# Patient Record
Sex: Female | Born: 2000 | Race: White | Hispanic: No | Marital: Single | Smoking: Former smoker
Health system: Southern US, Community
[De-identification: ages and names within clinical notes are randomized; demographics above are authoritative.]

## PROBLEM LIST (undated history)

## (undated) VITALS — BP 104/67 | HR 98 | Temp 97.7°F | Resp 16 | Ht 63.19 in | Wt 105.4 lb

## (undated) DIAGNOSIS — F1910 Other psychoactive substance abuse, uncomplicated: Secondary | ICD-10-CM

## (undated) DIAGNOSIS — A6000 Herpesviral infection of urogenital system, unspecified: Secondary | ICD-10-CM

## (undated) DIAGNOSIS — F32A Depression, unspecified: Secondary | ICD-10-CM

## (undated) DIAGNOSIS — M419 Scoliosis, unspecified: Secondary | ICD-10-CM

## (undated) DIAGNOSIS — J189 Pneumonia, unspecified organism: Secondary | ICD-10-CM

## (undated) DIAGNOSIS — F319 Bipolar disorder, unspecified: Secondary | ICD-10-CM

## (undated) DIAGNOSIS — F909 Attention-deficit hyperactivity disorder, unspecified type: Secondary | ICD-10-CM

## (undated) DIAGNOSIS — F329 Major depressive disorder, single episode, unspecified: Secondary | ICD-10-CM

## (undated) DIAGNOSIS — F1994 Other psychoactive substance use, unspecified with psychoactive substance-induced mood disorder: Secondary | ICD-10-CM

## (undated) DIAGNOSIS — L709 Acne, unspecified: Secondary | ICD-10-CM

## (undated) DIAGNOSIS — F419 Anxiety disorder, unspecified: Secondary | ICD-10-CM

## (undated) HISTORY — DX: Anxiety disorder, unspecified: F41.9

## (undated) HISTORY — DX: Herpesviral infection of urogenital system, unspecified: A60.00

---
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
## 2018-01-01 ENCOUNTER — Encounter (HOSPITAL_COMMUNITY): Payer: Self-pay | Admitting: Emergency Medicine

## 2018-01-01 ENCOUNTER — Other Ambulatory Visit: Payer: Self-pay

## 2018-01-01 ENCOUNTER — Emergency Department (HOSPITAL_COMMUNITY)
Admission: EM | Admit: 2018-01-01 | Discharge: 2018-01-01 | Disposition: A | Discharge status: Home / Self Care | Payer: PRIVATE HEALTH INSURANCE | Source: Home / Self Care | Attending: Emergency Medicine | Admitting: Emergency Medicine

## 2018-01-01 ENCOUNTER — Emergency Department (HOSPITAL_COMMUNITY): Payer: PRIVATE HEALTH INSURANCE

## 2018-01-01 ENCOUNTER — Emergency Department (HOSPITAL_COMMUNITY)
Admission: EM | Admit: 2018-01-01 | Discharge: 2018-01-01 | Disposition: A | Discharge status: Home / Self Care | Payer: PRIVATE HEALTH INSURANCE | Attending: Emergency Medicine | Admitting: Emergency Medicine

## 2018-01-01 DIAGNOSIS — F1910 Other psychoactive substance abuse, uncomplicated: Secondary | ICD-10-CM | POA: Insufficient documentation

## 2018-01-01 DIAGNOSIS — R1084 Generalized abdominal pain: Secondary | ICD-10-CM

## 2018-01-01 DIAGNOSIS — Z79899 Other long term (current) drug therapy: Secondary | ICD-10-CM | POA: Insufficient documentation

## 2018-01-01 DIAGNOSIS — R112 Nausea with vomiting, unspecified: Secondary | ICD-10-CM

## 2018-01-01 DIAGNOSIS — F909 Attention-deficit hyperactivity disorder, unspecified type: Secondary | ICD-10-CM | POA: Insufficient documentation

## 2018-01-01 DIAGNOSIS — F17210 Nicotine dependence, cigarettes, uncomplicated: Secondary | ICD-10-CM | POA: Diagnosis not present

## 2018-01-01 LAB — URINALYSIS, ROUTINE W REFLEX MICROSCOPIC
Bilirubin Urine: NEGATIVE
GLUCOSE, UA: NEGATIVE mg/dL
HGB URINE DIPSTICK: NEGATIVE
KETONES UR: 80 mg/dL — AB
Nitrite: NEGATIVE
PH: 7 (ref 5.0–8.0)
Protein, ur: 30 mg/dL — AB
Specific Gravity, Urine: 1.023 (ref 1.005–1.030)

## 2018-01-01 LAB — CBC
HCT: 44.6 % (ref 36.0–46.0)
Hemoglobin: 15 g/dL (ref 12.0–15.0)
MCH: 30.4 pg (ref 26.0–34.0)
MCHC: 33.6 g/dL (ref 30.0–36.0)
MCV: 90.3 fL (ref 80.0–100.0)
PLATELETS: 287 10*3/uL (ref 150–400)
RBC: 4.94 MIL/uL (ref 3.87–5.11)
RDW: 12.3 % (ref 11.5–15.5)
WBC: 9.6 10*3/uL (ref 4.0–10.5)
nRBC: 0 % (ref 0.0–0.2)

## 2018-01-01 LAB — COMPREHENSIVE METABOLIC PANEL
ALT: 21 U/L (ref 0–44)
AST: 26 U/L (ref 15–41)
Albumin: 3.8 g/dL (ref 3.5–5.0)
Alkaline Phosphatase: 38 U/L (ref 38–126)
Anion gap: 11 (ref 5–15)
BUN: 11 mg/dL (ref 6–20)
CHLORIDE: 109 mmol/L (ref 98–111)
CO2: 21 mmol/L — ABNORMAL LOW (ref 22–32)
CREATININE: 0.97 mg/dL (ref 0.44–1.00)
Calcium: 9 mg/dL (ref 8.9–10.3)
GFR calc Af Amer: >60 mL/min (ref >60)
Glucose, Bld: 89 mg/dL (ref 70–99)
Potassium: 4 mmol/L (ref 3.5–5.1)
Sodium: 141 mmol/L (ref 135–145)
TOTAL PROTEIN: 6.8 g/dL (ref 6.5–8.1)
Total Bilirubin: 0.8 mg/dL (ref 0.3–1.2)

## 2018-01-01 LAB — RAPID URINE DRUG SCREEN, HOSP PERFORMED
AMPHETAMINES: NOT DETECTED
BARBITURATES: NOT DETECTED
Benzodiazepines: POSITIVE — AB
Cocaine: NOT DETECTED
Opiates: NOT DETECTED
Tetrahydrocannabinol: POSITIVE — AB

## 2018-01-01 LAB — I-STAT BETA HCG BLOOD, ED (MC, WL, AP ONLY): I-stat hCG, quantitative: <5 m[IU]/mL (ref <5)

## 2018-01-01 LAB — ACETAMINOPHEN LEVEL

## 2018-01-01 LAB — SALICYLATE LEVEL

## 2018-01-01 MED ORDER — ONDANSETRON 4 MG PO TBDP: 4.0000 mg | ORAL_TABLET | Freq: Three times a day (TID) | ORAL | 0 refills | Status: DC | PRN

## 2018-01-01 MED ORDER — FAMOTIDINE IN NACL 20-0.9 MG/50ML-% IV SOLN
20.0000 mg | Freq: Once | INTRAVENOUS | Status: AC
  Administered 2018-01-01: 20 mg via INTRAVENOUS
  Filled 2018-01-01: qty 50

## 2018-01-01 MED ORDER — LACTATED RINGERS IV BOLUS
1000.0000 mL | Freq: Once | INTRAVENOUS | Status: AC
  Administered 2018-01-01: 1000 mL via INTRAVENOUS

## 2018-01-01 MED ORDER — SODIUM CHLORIDE 0.9 % IV BOLUS
1000.0000 mL | Freq: Once | INTRAVENOUS | Status: AC
  Administered 2018-01-01: 1000 mL via INTRAVENOUS

## 2018-01-01 MED ORDER — ONDANSETRON HCL 4 MG/2ML IJ SOLN
4.0000 mg | Freq: Once | INTRAMUSCULAR | Status: AC
  Administered 2018-01-01: 4 mg via INTRAVENOUS
  Filled 2018-01-01: qty 2

## 2018-01-01 MED ORDER — IOHEXOL 300 MG/ML  SOLN
100.0000 mL | Freq: Once | INTRAMUSCULAR | Status: AC | PRN
  Administered 2018-01-01: 100 mL via INTRAVENOUS

## 2018-01-01 MED ORDER — KETOROLAC TROMETHAMINE 30 MG/ML IJ SOLN
15.0000 mg | Freq: Once | INTRAMUSCULAR | Status: AC
  Administered 2018-01-01: 15 mg via INTRAVENOUS
  Filled 2018-01-01: qty 1

## 2018-01-01 MED ORDER — KETOROLAC TROMETHAMINE 15 MG/ML IJ SOLN
15.0000 mg | Freq: Once | INTRAMUSCULAR | Status: AC
  Administered 2018-01-01: 15 mg via INTRAVENOUS
  Filled 2018-01-01: qty 1
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
## 2018-01-01 NOTE — ED Provider Notes (Signed)
Patient placed in Quick Look pathway, seen and evaluated   Chief Complaint: Vomiting and abdominal pain  HPI:  Vomiting and abdominal pain   ROS: This is a 17 year old female who presents to the emergency department for abdominal pain and vomiting.  She was seen in the emergency department earlier today for similar symptoms, but states that they returned shortly after discharge.  She states that she took 7 percocet's two nights ago for mouth pain (she had mouth surgery last week.) States that ever since she has had non-stop vomiting and generalized abdominal pain. She states that pain is now worse in the RLQ. 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 States that this pain feels "different" due to focality in the RLQ. No prior abdominal surgeries. She is on her menstrual cycle.   Physical Exam:   Gen: No distress  Neuro: Awake and Alert  Skin: Warm    Focused Exam: Patient is retching.  Bowel sounds normoactive.  She is generally tender to palpation over the abdomen, although worse in the right quadrant.  No guarding or rigidity.  No rebound tenderness.  No surgical scars.   Initiation of care has begun. The patient has been counseled on the process, plan, and necessity for staying for the completion/evaluation, and the remainder of the medical screening examination    Lawrence Marseilles 01/01/18 1748    Eber Hong, MD 01/03/18 [phone]
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
## 2018-01-01 NOTE — ED Notes (Signed)
Pt states she bought the " percocet " from her dealer, they were not labeled , pt has no idea if they were what she wanted or not states  She trusted her dealer, pt states sher really tjhought taking 7 was ok

## 2018-01-01 NOTE — ED Notes (Signed)
Labs redrawn

## 2018-01-01 NOTE — ED Notes (Signed)
Pt given ginger ale wants another liter of fluid

## 2018-01-01 NOTE — ED Notes (Signed)
Pt states iv hurting and wants it out so I took it out

## 2018-01-01 NOTE — ED Notes (Signed)
Pt getting dressed now.  

## 2018-01-01 NOTE — ED Notes (Signed)
Pt in CT will re vital when she comes back.

## 2018-01-01 NOTE — Discharge Instructions (Signed)
Take Zofran as needed for nausea °Please return if you are worsening °

## 2018-01-01 NOTE — ED Notes (Signed)
EDP at bedside  

## 2018-01-01 NOTE — ED Provider Notes (Signed)
[facility] EMERGENCY DEPARTMENT Provider Note   CSN: [id number] Arrival date & time: 01/01/18  1610     History   Chief Complaint Chief Complaint  Patient presents with  . Drug Overdose    HPI Lindsay Lara is a 17 y.o. female.  HPI Patient is a 17 year old female with a past medical history of bipolar, depression, ADHD, and polysubstance abuse who presents to the emergency department for the second time today for evaluation of abdominal pain and vomiting.  Patient reports that she was seen earlier this morning for the same symptoms.  At that time patient was given IV fluids and IV Zofran with significant improvement to her symptoms.  Unfortunately patient was not able to get to a pharmacy in order to get more Zofran and as a result her symptoms recurred.  Patient then return to the emergency department for reevaluation.  Upon arrival patient continues to endorse generalized abdominal pain.  Earlier today she reported it was in the right lower quadrant but at this time it is more diffuse.  She has had 2-3 more episodes of vomiting since discharge this morning.  Her symptoms have otherwise stayed the same.  She denies any fevers, chills, area, flank pain, hematuria, or lightheadedness.  Remaining review of systems as below.  Past Medical History:  Diagnosis Date  . Acne   . ADHD (attention deficit hyperactivity disorder) 05/08/2012  . Anxiety   . Bipolar and related disorder (HCC) 09/18/2015  . Depression   . 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 Pneumonia at age 27  . Polysubstance abuse (HCC) 09/18/2015  . Scoliosis no treatment required  . Substance induced mood disorder (HCC) 09/18/2015    Patient Active Problem List   Diagnosis Date Noted  . 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 Severe bipolar I disorder, current or most recent episode depressed (HCC) 01/17/2017  . Adjustment disorder with mixed disturbance of emotions and  conduct 01/15/2017  . Overdose of benzodiazepine 01/14/2017  . Borderline personality disorder in adult [facility]) 01/14/2017  . Self-inflicted laceration of wrist/arms c/w known h/o "cutting" 01/14/2017  . 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 Bipolar affective disorder, current episode hypomanic (HCC) 05/08/2016  . Contact dermatitis 11/12/2015  . 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 Chlamydia 09/21/2015  . Substance induced mood disorder (HCC) 09/18/2015  . Polysubstance abuse (HCC) 09/18/2015  . MDD (major depressive disorder) 09/17/2015  . Idiopathic scoliosis 05/05/2015  . Contraception 06/18/2012  . Suicidal ideation 05/08/2012  . ADHD (attention deficit hyperactivity disorder) 05/08/2012  . Oppositional defiant disorder 05/08/2012  . Parent-child relational problem 05/08/2012    History reviewed. No pertinent surgical history.   OB History    Gravida  0   Para  0   Term  0   Preterm  0   AB  0   Living  0     SAB  0   TAB  0   Ectopic  0   Multiple  0   Live Births               Home Medications    Prior to Admission medications   Medication Sig Start Date End Date Taking? Authorizing Provider  acetaminophen (TYLENOL) 500 MG tablet Take 1,000 mg by mouth every 6 (six) hours as needed for mild pain.    [provider]  cephALEXin (KEFLEX) 500 MG capsule Take 500 mg by mouth 3 (three) times  daily. Started on 12-25-17 duration 7 DS. Skipped 3 days. 12/27/17   [provider]  cloNIDine (CATAPRES) 0.1 MG tablet Take 0.1-0.2 mg by mouth 3 (three) times daily. Taking one to two tablet(s) three times daily. 12/31/17   [provider]  gabapentin (NEURONTIN) 400 MG capsule Take 400 mg by mouth as needed (for anxiety).     [provider]  INTROVALE 0.15-0.03 MG tablet TAKE ONE TABLET BY MOUTH DAILY 01/15/17   Anyanwu, Jethro Bastos, MD  NON FORMULARY Take 2 capsules by mouth daily. CBD Capsule    [provider]  ondansetron (ZOFRAN ODT) 4 MG disintegrating tablet Take 1 tablet (4 mg total) by mouth every 8 (eight) hours as needed for nausea or vomiting. 01/01/18   Bethel Born, PA-C  ondansetron (ZOFRAN ODT) 4 MG disintegrating tablet Take 1 tablet (4 mg total) by mouth every 8 (eight) hours as needed for nausea or vomiting. 01/01/18   Zeek Rostron, Winfield Rast, MD  propranolol (INDERAL) 20 MG tablet Take 20 mg by mouth every 6 (six) hours as needed for anxiety. 12/31/17   [provider]  QUEtiapine (SEROQUEL) 300 MG tablet Take 300 mg by mouth at bedtime.    [provider]  valACYclovir (VALTREX) 1000 MG tablet Take 1 tablet (1,000 mg total) by mouth daily. May increase to 2 tablets twice daily for 5 days during an outbreak. 07/02/17   Tereso Newcomer, MD    Family History Family History  Problem Relation Age of Onset  . Alcoholism Father        and Paterna grandfather  . Heart disease Father   . Hyperlipidemia Father   . Drug abuse Paternal Uncle     Social History Social History   Tobacco Use  . Smoking status: Current Every Day Smoker    Packs/day: 0.50    Types: Cigarettes    Last attempt to quit: 01/11/2016    Years since quitting: 1.9  . Smokeless tobacco: Never Used  Substance Use Topics  . Alcohol use: Yes    Comment: occasional  . Drug use: Yes    Types: Marijuana, Benzodiazepines, Cocaine    Comment: acid, xanax     Allergies   Latex   Review of Systems Review of Systems  Constitutional: Negative for chills and fever.  HENT: Negative for ear pain and sore throat.   Eyes: Negative for pain and visual disturbance.  Respiratory: Negative for cough and shortness of breath.   Cardiovascular: Negative for chest pain and palpitations.  Gastrointestinal: Positive for abdominal pain, nausea and vomiting. Negative for constipation and diarrhea.  Genitourinary: Negative for dysuria, flank pain and hematuria.  Musculoskeletal: Negative for  arthralgias and back pain.  Skin: Negative for color change and rash.  Neurological: Negative for seizures and syncope.  All other systems reviewed and are negative.    Physical Exam Updated Vital Signs BP 111/87 (BP Location: Right Arm)   Pulse 87   Temp 98.3 F (36.8 C) (Oral)   Resp 18   SpO2 100%   Physical Exam  Constitutional: She appears well-developed and well-nourished. No distress.  HENT:  Head: Normocephalic and atraumatic.  Eyes: Conjunctivae are normal.  Neck: Neck supple.  Cardiovascular: Normal rate and regular rhythm.  No murmur heard. Pulmonary/Chest: Effort normal and breath sounds normal. No respiratory distress.  Abdominal: Soft. There is no tenderness.  Musculoskeletal: She exhibits no edema.  Neurological: She is alert.  Skin: Skin is warm and dry.  Psychiatric:  She has a normal mood and affect.  Nursing note and vitals reviewed.    ED Treatments / Results  Labs (all labs ordered are listed, but only abnormal results are displayed) Labs Reviewed - No data to display  EKG None  Radiology Ct Abdomen Pelvis W Contrast  Result Date: 01/01/2018 CLINICAL DATA:  Abdominal pain and emesis beginning 2 days ago after taking Percocet. Oral surgery 2 weeks ago. EXAM: CT ABDOMEN AND PELVIS WITH CONTRAST TECHNIQUE: Multidetector CT imaging of the abdomen and pelvis was performed using the standard protocol following bolus administration of intravenous contrast. CONTRAST:  OMNIPAQUE IOHEXOL 300 MG/ML  SOLN COMPARISON:  08/21/2017 and 05/03/2017 FINDINGS: Lower chest: Normal. Hepatobiliary: Liver, gallbladder and biliary tree are normal. Pancreas: Normal. Spleen: Normal. Adrenals/Urinary Tract: Adrenal glands are normal. Kidneys are normal in size without hydronephrosis or nephrolithiasis. Ureters and bladder are normal. Stomach/Bowel: Stomach and small bowel are normal. Appendix is normal. Colon is normal. Vascular/Lymphatic: Normal. Reproductive: Uterus and  ovaries within normal. Tampon present over the vagina. Minimal free fluid over the pelvis likely physiologic. Other: No focal inflammatory change. Musculoskeletal: Normal. IMPRESSION: No acute findings in the abdomen/pelvis. Electronically Signed   By: Elberta Fortis M.D.   On: 01/01/2018 19:42    Procedures Procedures (including critical care time)  Medications Ordered in ED Medications  ondansetron (ZOFRAN) injection 4 mg (4 mg Intravenous Given 01/01/18 1838)  sodium chloride 0.9 % bolus 1,000 mL (0 mLs Intravenous Stopped 01/01/18 1943)  ketorolac (TORADOL) 15 MG/ML injection 15 mg (15 mg Intravenous Given 01/01/18 1839)  iohexol (OMNIPAQUE) 300 MG/ML solution 100 mL (100 mLs Intravenous Contrast Given 01/01/18 1910)  ondansetron (ZOFRAN) injection 4 mg (4 mg Intravenous Given 01/01/18 2051)  lactated ringers bolus 1,000 mL (0 mLs Intravenous Stopped 01/01/18 2151)     Initial Impression / Assessment and Plan / ED Course  I have reviewed the triage vital signs and the nursing notes.  Pertinent labs & imaging results that were available during my care of the patient were reviewed by me and considered in my medical decision making (see chart for details).     Patient is a 17 year old female with history as detailed above who presents the emergency department for evaluation of nausea, vomiting, abdominal pain.  Patient reportedly took some nonprescribed opioids a few days ago and since that time is had nausea, vomiting, abdominal pain.  She was seen in the emergency department earlier today at which time laboratory studies were obtained and were overall unremarkable.  Patient was given IV fluids and Zofran with complete resolution of her symptoms.  Patient was then discharged.  However after being discharged patient states that her symptoms recurred so she came back to the emergency department for reevaluation.  During this visit a CT scan was obtained which does not show any acute  abnormalities.  I reviewed patient's labs and they are reassuring.  My physical exam is as detailed above but does not show any focal tenderness.  Patient again given IV fluids and IV Zofran with complete resolution of her symptoms.  She was then appropriate for discharge from the emergency department with instructions to follow-up as an outpatient and return to the emergency department should her symptoms recur.  At this time I doubt any emergent abdominal pathology to be responsible for patient's symptoms.  No evidence of appendicitis on patient's CT, no elevated white blood cell count, and no right lower quadrant tenderness during my exam.  Patient's UA does not appear  to be a clean-catch from earlier this afternoon, however patient does deny any urinary symptoms.  Patient's history not consistent with ovarian torsion.  No vaginal discharge or lower abdominal pain to suggest GYN pathology.  Patient with no epigastric tenderness or CT evidence of pancreatitis.  No right upper quadrant tenderness, or CT evidence of acute cholecystitis.  Given patient's reassuring work-up she is appropriate for discharge at this time.  The care of this patient was discussed with my attending physician Dr. Hyacinth Meeker, who voices agreement with work-up and ED disposition.  Final Clinical Impressions(s) / ED Diagnoses   Final diagnoses:  Non-intractable vomiting with nausea, unspecified vomiting type  Generalized abdominal pain    ED Discharge Orders         Ordered    ondansetron (ZOFRAN ODT) 4 MG disintegrating tablet  Every 8 hours PRN     01/01/18 2200           Luvern Mcisaac, Winfield Rast, MD 01/02/18 2142    Eber Hong, MD 01/03/18 [phone]

## 2018-01-01 NOTE — ED Notes (Signed)
Pt drinking ginger ale  

## 2018-01-01 NOTE — ED Notes (Signed)
Per lab tech, samples need to be redrawn due to hemolysis.

## 2018-02-07 ENCOUNTER — Other Ambulatory Visit: Payer: Self-pay

## 2018-02-07 ENCOUNTER — Inpatient Hospital Stay (HOSPITAL_COMMUNITY)
Admission: RE | Admit: 2018-02-07 | Discharge: 2018-02-11 | DRG: 885 | Disposition: A | Discharge status: Home / Self Care | Payer: PRIVATE HEALTH INSURANCE | Attending: Psychiatry | Admitting: Psychiatry

## 2018-02-07 ENCOUNTER — Encounter (HOSPITAL_COMMUNITY): Payer: Self-pay

## 2018-02-07 DIAGNOSIS — G4700 Insomnia, unspecified: Secondary | ICD-10-CM | POA: Diagnosis present

## 2018-02-07 DIAGNOSIS — F603 Borderline personality disorder: Secondary | ICD-10-CM | POA: Diagnosis present

## 2018-02-07 DIAGNOSIS — F332 Major depressive disorder, recurrent severe without psychotic features: Secondary | ICD-10-CM | POA: Diagnosis present

## 2018-02-07 DIAGNOSIS — Z91040 Latex allergy status: Secondary | ICD-10-CM

## 2018-02-07 DIAGNOSIS — Z793 Long term (current) use of hormonal contraceptives: Secondary | ICD-10-CM | POA: Diagnosis not present

## 2018-02-07 DIAGNOSIS — F913 Oppositional defiant disorder: Secondary | ICD-10-CM | POA: Diagnosis present

## 2018-02-07 DIAGNOSIS — R062 Wheezing: Secondary | ICD-10-CM

## 2018-02-07 DIAGNOSIS — F909 Attention-deficit hyperactivity disorder, unspecified type: Secondary | ICD-10-CM | POA: Diagnosis present

## 2018-02-07 DIAGNOSIS — J45909 Unspecified asthma, uncomplicated: Secondary | ICD-10-CM | POA: Diagnosis present

## 2018-02-07 DIAGNOSIS — N926 Irregular menstruation, unspecified: Secondary | ICD-10-CM

## 2018-02-07 DIAGNOSIS — F419 Anxiety disorder, unspecified: Secondary | ICD-10-CM | POA: Diagnosis present

## 2018-02-07 DIAGNOSIS — K5900 Constipation, unspecified: Secondary | ICD-10-CM | POA: Diagnosis present

## 2018-02-07 DIAGNOSIS — F17210 Nicotine dependence, cigarettes, uncomplicated: Secondary | ICD-10-CM | POA: Diagnosis present

## 2018-02-07 DIAGNOSIS — Z79899 Other long term (current) drug therapy: Secondary | ICD-10-CM

## 2018-02-07 DIAGNOSIS — F1320 Sedative, hypnotic or anxiolytic dependence, uncomplicated: Secondary | ICD-10-CM | POA: Diagnosis present

## 2018-02-07 DIAGNOSIS — F4310 Post-traumatic stress disorder, unspecified: Secondary | ICD-10-CM | POA: Diagnosis present

## 2018-02-07 DIAGNOSIS — A6009 Herpesviral infection of other urogenital tract: Secondary | ICD-10-CM

## 2018-02-07 LAB — COMPREHENSIVE METABOLIC PANEL
ALT: 16 U/L (ref 0–44)
AST: 23 U/L (ref 15–41)
Albumin: 4.3 g/dL (ref 3.5–5.0)
Alkaline Phosphatase: 46 U/L (ref 38–126)
Anion gap: 10 (ref 5–15)
BUN: 9 mg/dL (ref 6–20)
CO2: 24 mmol/L (ref 22–32)
Calcium: 9.7 mg/dL (ref 8.9–10.3)
Chloride: 108 mmol/L (ref 98–111)
Creatinine, Ser: 0.99 mg/dL (ref 0.44–1.00)
GFR calc non Af Amer: >60 mL/min (ref >60)
Glucose, Bld: 76 mg/dL (ref 70–99)
Potassium: 4.2 mmol/L (ref 3.5–5.1)
SODIUM: 142 mmol/L (ref 135–145)
Total Bilirubin: 0.3 mg/dL (ref 0.3–1.2)
Total Protein: 7.5 g/dL (ref 6.5–8.1)

## 2018-02-07 LAB — CBC
HCT: 41.6 % (ref 36.0–46.0)
Hemoglobin: 13.2 g/dL (ref 12.0–15.0)
MCH: 30.1 pg (ref 26.0–34.0)
MCHC: 31.7 g/dL (ref 30.0–36.0)
MCV: 95 fL (ref 80.0–100.0)
NRBC: 0 % (ref 0.0–0.2)
PLATELETS: 339 10*3/uL (ref 150–400)
RBC: 4.38 MIL/uL (ref 3.87–5.11)
RDW: 13.2 % (ref 11.5–15.5)
WBC: 8.6 10*3/uL (ref 4.0–10.5)

## 2018-02-07 LAB — ETHANOL: Alcohol, Ethyl (B): <10 mg/dL (ref <10)

## 2018-02-07 LAB — TSH: TSH: 1.226 u[IU]/mL (ref 0.350–4.500)

## 2018-02-07 MED ORDER — ADULT MULTIVITAMIN W/MINERALS CH
1.0000 | ORAL_TABLET | Freq: Every day | ORAL | Status: DC
  Administered 2018-02-07 – 2018-02-11 (×5): 1 via ORAL
  Filled 2018-02-07 (×8): qty 1

## 2018-02-07 MED ORDER — ALBUTEROL SULFATE HFA 108 (90 BASE) MCG/ACT IN AERS: 1.0000 | INHALATION_SPRAY | Freq: Four times a day (QID) | RESPIRATORY_TRACT | Status: DC | PRN

## 2018-02-07 MED ORDER — LEVONORGEST-ETH ESTRAD 91-DAY 0.15-0.03 MG PO TABS
1.0000 | ORAL_TABLET | Freq: Every day | ORAL | Status: DC
  Administered 2018-02-08 – 2018-02-11 (×4): 1 via ORAL

## 2018-02-07 MED ORDER — LOPERAMIDE HCL 2 MG PO CAPS: 2.0000 mg | ORAL_CAPSULE | ORAL | Status: AC | PRN

## 2018-02-07 MED ORDER — MAGNESIUM HYDROXIDE 400 MG/5ML PO SUSP
30.0000 mL | Freq: Every day | ORAL | Status: DC | PRN
  Administered 2018-02-07: 30 mL via ORAL
  Filled 2018-02-07: qty 30

## 2018-02-07 MED ORDER — ALUM & MAG HYDROXIDE-SIMETH 200-200-20 MG/5ML PO SUSP: 30.0000 mL | ORAL | Status: DC | PRN

## 2018-02-07 MED ORDER — HYDROXYZINE HCL 25 MG PO TABS
25.0000 mg | ORAL_TABLET | Freq: Four times a day (QID) | ORAL | Status: DC | PRN
  Administered 2018-02-07 – 2018-02-08 (×3): 25 mg via ORAL
  Filled 2018-02-07 (×3): qty 1

## 2018-02-07 MED ORDER — ALBUTEROL SULFATE 2 MG/5ML PO SYRP: 2.0000 mg | ORAL_SOLUTION | Freq: Three times a day (TID) | ORAL | Status: DC | PRN

## 2018-02-07 MED ORDER — BENZONATATE 100 MG PO CAPS: 100.0000 mg | ORAL_CAPSULE | Freq: Three times a day (TID) | ORAL | Status: DC | PRN

## 2018-02-07 MED ORDER — VITAMIN B-1 100 MG PO TABS
100.0000 mg | ORAL_TABLET | Freq: Every day | ORAL | Status: DC
  Administered 2018-02-08 – 2018-02-11 (×4): 100 mg via ORAL
  Filled 2018-02-07 (×7): qty 1

## 2018-02-07 MED ORDER — ACETAMINOPHEN 325 MG PO TABS
650.0000 mg | ORAL_TABLET | Freq: Four times a day (QID) | ORAL | Status: DC | PRN
  Administered 2018-02-07 – 2018-02-10 (×3): 650 mg via ORAL
  Filled 2018-02-07 (×3): qty 2

## 2018-02-07 MED ORDER — TRAZODONE HCL 50 MG PO TABS
50.0000 mg | ORAL_TABLET | Freq: Every evening | ORAL | Status: DC | PRN
  Administered 2018-02-07: 50 mg via ORAL
  Filled 2018-02-07 (×2): qty 1

## 2018-02-07 MED ORDER — CHLORDIAZEPOXIDE HCL 25 MG PO CAPS
25.0000 mg | ORAL_CAPSULE | Freq: Four times a day (QID) | ORAL | Status: AC | PRN
  Administered 2018-02-07 – 2018-02-09 (×7): 25 mg via ORAL
  Filled 2018-02-07 (×7): qty 1

## 2018-02-07 MED ORDER — ONDANSETRON 4 MG PO TBDP
4.0000 mg | ORAL_TABLET | Freq: Four times a day (QID) | ORAL | Status: DC | PRN
  Administered 2018-02-07 – 2018-02-08 (×2): 4 mg via ORAL
  Filled 2018-02-07 (×2): qty 1
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
## 2018-02-07 NOTE — Progress Notes (Signed)
Urine sample collected. Pending results.  

## 2018-02-08 ENCOUNTER — Ambulatory Visit (HOSPITAL_COMMUNITY)
Admission: RE | Admit: 2018-02-08 | Discharge: 2018-02-08 | Disposition: A | Discharge status: Home / Self Care | Payer: PRIVATE HEALTH INSURANCE | Source: Home / Self Care | Attending: Psychiatry | Admitting: Psychiatry

## 2018-02-08 LAB — URINALYSIS, ROUTINE W REFLEX MICROSCOPIC
Bacteria, UA: NONE SEEN
Bilirubin Urine: NEGATIVE
Glucose, UA: NEGATIVE mg/dL
Hgb urine dipstick: NEGATIVE
Ketones, ur: NEGATIVE mg/dL
Nitrite: NEGATIVE
Protein, ur: NEGATIVE mg/dL
Specific Gravity, Urine: 1.002 — ABNORMAL LOW (ref 1.005–1.030)
pH: 7 (ref 5.0–8.0)

## 2018-02-08 LAB — RAPID URINE DRUG SCREEN, HOSP PERFORMED
Amphetamines: NOT DETECTED
Barbiturates: NOT DETECTED
Benzodiazepines: POSITIVE — AB
Cocaine: NOT DETECTED
Opiates: NOT DETECTED
Tetrahydrocannabinol: POSITIVE — AB

## 2018-02-08 LAB — PREGNANCY, URINE: Preg Test, Ur: NEGATIVE

## 2018-02-08 MED ORDER — CLONIDINE HCL 0.1 MG PO TABS
0.1000 mg | ORAL_TABLET | Freq: Three times a day (TID) | ORAL | Status: DC | PRN
  Filled 2018-02-08 (×2): qty 1

## 2018-02-08 MED ORDER — ALBUTEROL SULFATE HFA 108 (90 BASE) MCG/ACT IN AERS: 1.0000 | INHALATION_SPRAY | Freq: Four times a day (QID) | RESPIRATORY_TRACT | Status: DC | PRN

## 2018-02-08 MED ORDER — CLONIDINE HCL 0.1 MG PO TABS
0.1000 mg | ORAL_TABLET | Freq: Every day | ORAL | Status: DC
  Filled 2018-02-08 (×2): qty 1

## 2018-02-08 MED ORDER — GABAPENTIN 300 MG PO CAPS
600.0000 mg | ORAL_CAPSULE | Freq: Two times a day (BID) | ORAL | Status: DC
  Administered 2018-02-08 – 2018-02-11 (×6): 600 mg via ORAL
  Filled 2018-02-08 (×12): qty 2

## 2018-02-08 MED ORDER — SERTRALINE HCL 25 MG PO TABS
25.0000 mg | ORAL_TABLET | Freq: Every day | ORAL | Status: DC
  Administered 2018-02-09 – 2018-02-10 (×2): 25 mg via ORAL
  Filled 2018-02-08 (×4): qty 1

## 2018-02-08 MED ORDER — NICOTINE POLACRILEX 2 MG MT GUM
2.0000 mg | CHEWING_GUM | OROMUCOSAL | Status: DC | PRN
  Administered 2018-02-09 – 2018-02-11 (×5): 2 mg via ORAL
  Filled 2018-02-08: qty 1
  Filled 2018-02-08: qty 3
  Filled 2018-02-08: qty 1

## 2018-02-08 MED ORDER — HYDROXYZINE HCL 50 MG PO TABS
50.0000 mg | ORAL_TABLET | Freq: Four times a day (QID) | ORAL | Status: DC | PRN
  Administered 2018-02-08 – 2018-02-10 (×5): 50 mg via ORAL
  Filled 2018-02-08 (×5): qty 1

## 2018-02-08 MED ORDER — ENSURE ENLIVE PO LIQD
237.0000 mL | ORAL | Status: DC
  Administered 2018-02-08 – 2018-02-11 (×3): 237 mL via ORAL

## 2018-02-08 MED ORDER — VALACYCLOVIR HCL 500 MG PO TABS
500.0000 mg | ORAL_TABLET | Freq: Every day | ORAL | Status: DC
  Filled 2018-02-08 (×3): qty 1

## 2018-02-08 MED ORDER — ONDANSETRON 4 MG PO TBDP
8.0000 mg | ORAL_TABLET | Freq: Four times a day (QID) | ORAL | Status: AC | PRN
  Administered 2018-02-08: 8 mg via ORAL
  Filled 2018-02-08: qty 2

## 2018-02-08 MED ORDER — MAGNESIUM CITRATE PO SOLN
1.0000 | Freq: Once | ORAL | Status: AC
  Administered 2018-02-08: 1 via ORAL

## 2018-02-08 MED ORDER — TRAZODONE HCL 50 MG PO TABS: 50.0000 mg | ORAL_TABLET | Freq: Every evening | ORAL | Status: DC | PRN

## 2018-02-08 MED ORDER — QUETIAPINE FUMARATE 300 MG PO TABS
300.0000 mg | ORAL_TABLET | Freq: Every day | ORAL | Status: DC
  Administered 2018-02-08 – 2018-02-10 (×3): 300 mg via ORAL
  Filled 2018-02-08 (×5): qty 1

## 2018-02-08 MED ORDER — IBUPROFEN 600 MG PO TABS
600.0000 mg | ORAL_TABLET | Freq: Four times a day (QID) | ORAL | Status: DC | PRN
  Administered 2018-02-08 – 2018-02-11 (×5): 600 mg via ORAL
  Filled 2018-02-08 (×7): qty 1

## 2018-02-08 MED ORDER — CLONIDINE HCL 0.1 MG PO TABS
0.2000 mg | ORAL_TABLET | Freq: Three times a day (TID) | ORAL | Status: DC | PRN
  Administered 2018-02-08 – 2018-02-11 (×6): 0.2 mg via ORAL
  Filled 2018-02-08 (×5): qty 2

## 2018-02-08 MED ORDER — VALACYCLOVIR HCL 500 MG PO TABS
1000.0000 mg | ORAL_TABLET | Freq: Every day | ORAL | Status: DC
  Administered 2018-02-08 – 2018-02-11 (×4): 1000 mg via ORAL
  Filled 2018-02-08 (×5): qty 2

## 2018-02-08 NOTE — [facility] Suicide Risk Assessment (Signed)
[facility] INPATIENT:  Family/Significant Other Suicide Prevention Education  Suicide Prevention Education:  Education Completed; Loa SocksRandy Johnston, father [phone]) has been identified by the patient as the family member/significant other with whom the patient will be residing, and identified as the person(s) who will aid the patient in the event of a mental health crisis (suicidal ideations/suicide attempt).  With written consent from the patient, the family member/significant other has been provided the following suicide prevention education, prior to the and/or following the discharge of the patient.  The suicide prevention education provided includes the following:  Suicide risk factors  Suicide prevention and interventions  National Suicide Hotline telephone number  [facility] assessment telephone number  [facility] Emergency Assistance 911  [facility] and/or Residential Mobile Crisis [facility] telephone number  Request made of family/significant other to:  Remove weapons (e.g., guns, rifles, knives), all items previously/currently identified as safety concern.    Remove drugs/medications (over-the-counter, prescriptions, illicit drugs), all items previously/currently identified as a safety concern.  The family member/significant other verbalizes understanding of the suicide prevention education information provided.  The family member/significant other agrees to remove the items of safety concern listed above.  Maeola SarahJolan E Shakari Qazi 02/08/2018, 3:10 PM

## 2018-02-08 NOTE — Progress Notes (Signed)
Pt was observed interacting with peers in the dayroom. Pt appears labile/irritable in affect and mood. Pt denies SI/HI/AVH at this time. Rates pain 7/10; Headache. Pt appears to be preoccupied with various somatic c/o's with attention and medication seeking behaviors. Pt states "Can I have a PRN anytime I get a panic attack?". 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 Pt states she is going through clonidine and xanax withdrawals. Pt needs frequent redirection from staff. Support and encouragement offered. Will continue with POC.

## 2018-02-08 NOTE — Plan of Care (Signed)
  Problem: Education: Goal: Knowledge of [place] [facility] Education information/materials will improve Outcome: Progressing   

## 2018-02-08 NOTE — Progress Notes (Addendum)
Pt observed sitting in bed coloring.

## 2018-02-08 NOTE — Progress Notes (Signed)
Adult Psychoeducational Group Note  Date:  02/08/2018 Time:  6:27 AM  Group Topic/Focus:  Wrap-Up Group:   The focus of this group is to help patients review their daily goal of treatment and discuss progress on daily workbooks.  Participation Level:  Active  Participation Quality:  Appropriate and Attentive  Affect:  Appropriate  Cognitive:  Alert and Appropriate  Insight: Appropriate and Good  Engagement in Group:  Engaged  Modes of Intervention:  Discussion  Additional Comments: pt Lindsay Lara, Lindsay Lara pt attend wrap up group. Her day was a 5. The one positive thing that happen to her she check herself in to detox. 02/08/2018, 6:27 AM
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
## 2018-02-08 NOTE — Progress Notes (Signed)
[facility] MD Progress Note  02/08/2018 11:28 AM Lindsay Lara  MRN:  [id number] Subjective:  Patient is seen and examined. Patient is a 17 year old female with a past psychiatric history significant for benzodiazepine dependence and possible posttraumatic stress disorder as well as borderline personality disorder who presented as a walk-in patient to the behavioral health hospital on 02/07/2018.   Objective: Patient is seen and examined.  Patient is a 17 year old female with the above-stated past psychiatric history who is seen in follow-up.  She is more collectively improved today.  Much less histrionic and irritable.  She did apologize for her behavior yesterday.  She request some of her medications that she takes at home be restarted.  She is on gabapentin, Valtrex, clonidine and Seroquel.  She stated she is been on trazodone before but it is not been effective in terms of treating her insomnia.  She is tearful at times today about her previous trauma as well as some of the of her behavior that is taken place when she is been using the Xanax.  She denies suicidal ideation today.  She is resting medications for anxiety, nausea etc.  We also discussed going back on sertraline.  Mainly for her depressive and posttraumatic stress disorder symptoms.  Her chest x-ray results are still pending.  She slept approximately 6 hours last night.  Vital signs are stable, she is afebrile.  Principal Problem: <principal problem not specified> Diagnosis: Active Problems:   MDD (major depressive disorder), recurrent severe, without psychosis (HCC)  Total Time spent with patient: 20 minutes  Past Psychiatric History: See admission H&P  Past Medical History:  Past Medical History:  Diagnosis Date  . Acne   . ADHD (attention deficit hyperactivity disorder) 05/08/2012  . Anxiety   . Bipolar and related disorder (HCC) 09/18/2015  . Depression   . 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 Pneumonia at age 26  .  Polysubstance abuse (HCC) 09/18/2015  . Scoliosis no treatment required  . Substance induced mood disorder (HCC) 09/18/2015   History reviewed. No pertinent surgical history. Family History:  Family History  Problem Relation Age of Onset  . Alcoholism Father        and Paterna grandfather  . Heart disease Father   . Hyperlipidemia Father   . Drug abuse Paternal Uncle    Family Psychiatric  History: See admission H&P Social History:  Social History   Substance and Sexual Activity  Alcohol Use Yes   Comment: occasional     Social History   Substance and Sexual Activity  Drug Use Yes  . Types: Marijuana, Benzodiazepines, Cocaine   Comment: acid, xanax    Social History   Socioeconomic History  . Marital status: Single    Spouse name: Not on file  . Number of children: Not on file  . Years of education: Not on file  . Highest education level: Not on file  Occupational History  . Not on file  Social Needs  . Financial resource strain: Not on file  . Food insecurity:    Worry: Not on file    Inability: Not on file  . Transportation needs:    Medical: Not on file    Non-medical: Not on file  Tobacco Use  . Smoking status: Current Every Day Smoker    Packs/day: 0.50    Types: Cigarettes    Last attempt to quit: 01/11/2016    Years since quitting: 2.0  . Smokeless tobacco: Never Used  Substance and Sexual Activity  . Alcohol use: Yes    Comment: occasional  . Drug use: Yes    Types: Marijuana, Benzodiazepines, Cocaine    Comment: acid, xanax  . Sexual activity: Yes    Partners: Male    Birth control/protection: Pill  Lifestyle  . Physical activity:    Days per week: Not on file    Minutes per session: Not on file  . Stress: Not on file  Relationships  . Social connections:    Talks on phone: Not on file    Gets together: Not on file    Attends religious service: Not on file    Active member of club or organization: Not on file    Attends meetings of clubs  or organizations: Not on file    Relationship status: Not on file  Other Topics Concern  . Not on file  Social History Narrative  . Not on file   Additional Social History:    Pain Medications: see MAR Prescriptions: see MAR Over the Counter: see MAR History of alcohol / drug use?: Yes Longest period of sobriety (when/how long): 8 months Name of Substance 1: Xanax 1 - Age of First Use: 15 1 - Amount (size/oz): 6-12 bars 1 - Frequency: daily 1 - Duration: 2 years 1 - Last Use / Amount: 12/4- 11 bars                  Sleep: Fair  Appetite:  Fair  Current Medications: Current Facility-Administered Medications  Medication Dose Route Frequency Provider Last Rate Last Dose  . acetaminophen (TYLENOL) tablet 650 mg  650 mg Oral Q6H PRN Antonieta Pert, MD   650 mg at 02/07/18 2159  . albuterol (PROVENTIL HFA;VENTOLIN HFA) 108 (90 Base) MCG/ACT inhaler 1-2 puff  1-2 puff Inhalation Q6H PRN Antonieta Pert, MD      . albuterol (PROVENTIL,VENTOLIN) 2 MG/5ML syrup 2 mg  2 mg Oral TID PRN Antonieta Pert, MD      . alum & mag hydroxide-simeth (MAALOX/MYLANTA) 200-200-20 MG/5ML suspension 30 mL  30 mL Oral Q4H PRN Antonieta Pert, MD      . benzonatate (TESSALON) capsule 100 mg  100 mg Oral TID PRN Antonieta Pert, MD      . chlordiazePOXIDE (LIBRIUM) capsule 25 mg  25 mg Oral Q6H PRN Antonieta Pert, MD   25 mg at 02/08/18 1016  . feeding supplement (ENSURE ENLIVE) (ENSURE ENLIVE) liquid 237 mL  237 mL Oral Q24H Antonieta Pert, MD   237 mL at 02/08/18 0941  . hydrOXYzine (ATARAX/VISTARIL) tablet 25 mg  25 mg Oral Q6H PRN Antonieta Pert, MD   25 mg at 02/08/18 1016  . levonorgestrel-ethinyl estradiol (SEASONALE,INTROVALE,JOLESSA) 0.15-0.03 MG per tablet 1 tablet  1 tablet Oral Daily Armandina Stammer I, NP   1 tablet at 02/08/18 0939  . loperamide (IMODIUM) capsule 2-4 mg  2-4 mg Oral PRN Antonieta Pert, MD      . magnesium hydroxide (MILK OF MAGNESIA)  suspension 30 mL  30 mL Oral Daily PRN Antonieta Pert, MD   30 mL at 02/07/18 2200  . multivitamin with minerals tablet 1 tablet  1 tablet Oral Daily Antonieta Pert, MD   1 tablet at 02/08/18 0947  . ondansetron (ZOFRAN-ODT) disintegrating tablet 4 mg  4 mg Oral Q6H PRN Antonieta Pert, MD   4 mg at 02/08/18 1017  . thiamine (VITAMIN B-1) tablet 100 mg  100 mg Oral Daily Antonieta Pert, MD   100 mg at 02/08/18 0946  . traZODone (DESYREL) tablet 50 mg  50 mg Oral QHS PRN Antonieta Pert, MD   50 mg at 02/07/18 2201    Lab Results:  Results for orders placed or performed during the hospital encounter of 02/07/18 (from the past 48 hour(s))  Urinalysis, Routine w reflex microscopic     Status: Abnormal   Collection Time: 02/07/18 12:58 PM  Result Value Ref Range   Color, Urine YELLOW YELLOW   APPearance CLEAR CLEAR   Specific Gravity, Urine 1.002 (L) 1.005 - 1.030   pH 7.0 5.0 - 8.0   Glucose, UA NEGATIVE NEGATIVE mg/dL   Hgb urine dipstick NEGATIVE NEGATIVE   Bilirubin Urine NEGATIVE NEGATIVE   Ketones, ur NEGATIVE NEGATIVE mg/dL   Protein, ur NEGATIVE NEGATIVE mg/dL   Nitrite NEGATIVE NEGATIVE   Leukocytes, UA SMALL (A) NEGATIVE   RBC / HPF 0-5 0 - 5 RBC/hpf   WBC, UA 6-10 0 - 5 WBC/hpf   Bacteria, UA NONE SEEN NONE SEEN   Squamous Epithelial / LPF 0-5 0 - 5    Comment: Performed at [facility], [address] [place], [place] [address]  Pregnancy, urine     Status: None   Collection Time: 02/07/18 12:58 PM  Result Value Ref Range   Preg Test, Ur NEGATIVE NEGATIVE    Comment:        THE SENSITIVITY OF THIS METHODOLOGY IS >20 mIU/mL. Performed at [facility], [address], [place], [place] [address]   Rapid urine drug screen (hospital performed)     Status: Abnormal   Collection Time: 02/07/18 12:58 PM  Result Value Ref Range   Opiates NONE DETECTED NONE DETECTED   Cocaine NONE DETECTED NONE DETECTED   Benzodiazepines  POSITIVE (A) NONE DETECTED   Amphetamines NONE DETECTED NONE DETECTED   Tetrahydrocannabinol POSITIVE (A) NONE DETECTED   Barbiturates NONE DETECTED NONE DETECTED    Comment: (NOTE) DRUG SCREEN FOR MEDICAL PURPOSES ONLY.  IF CONFIRMATION IS NEEDED FOR ANY PURPOSE, NOTIFY LAB WITHIN 5 DAYS. LOWEST DETECTABLE LIMITS FOR URINE DRUG SCREEN Drug Class                     Cutoff (ng/mL) Amphetamine and metabolites    1000 Barbiturate and metabolites    200 Benzodiazepine                 200 Tricyclics and metabolites     300 Opiates and metabolites        300 Cocaine and metabolites        300 THC                            50 Performed at [facility], [address], [place], [place] [address]   Comprehensive metabolic panel     Status: None   Collection Time: 02/07/18  6:45 PM  Result Value Ref Range   Sodium 142 135 - 145 mmol/L   Potassium 4.2 3.5 - 5.1 mmol/L   Chloride 108 98 - 111 mmol/L   CO2 24 22 - 32 mmol/L   Glucose, Bld 76 70 - 99 mg/dL   BUN 9 6 - 20 mg/dL   Creatinine, Ser 9.14 0.44 - 1.00 mg/dL   Calcium 9.7 8.9 - 78.2 mg/dL   Total Protein 7.5 6.5 - 8.1  g/dL   Albumin 4.3 3.5 - 5.0 g/dL   AST 23 15 - 41 U/L   ALT 16 0 - 44 U/L   Alkaline Phosphatase 46 38 - 126 U/L   Total Bilirubin 0.3 0.3 - 1.2 mg/dL   GFR calc non Af Amer >60 >60 mL/min   GFR calc Af Amer >60 >60 mL/min   Anion gap 10 5 - 15    Comment: Performed at [facility], [address] [place], [place] [address]  CBC     Status: None   Collection Time: 02/07/18  6:45 PM  Result Value Ref Range   WBC 8.6 4.0 - 10.5 K/uL   RBC 4.38 3.87 - 5.11 MIL/uL   Hemoglobin 13.2 12.0 - 15.0 g/dL   HCT 60.441.6 54.036.0 - 98.146.0 %   MCV 95.0 80.0 - 100.0 fL   MCH 30.1 26.0 - 34.0 pg   MCHC 31.7 30.0 - 36.0 g/dL   RDW 19.113.2 47.811.5 - 29.515.5 %   Platelets 339 150 - 400 K/uL   nRBC 0.0 0.0 - 0.2 %    Comment: Performed at [facility], [address]  [place], [place] [address]  Ethanol     Status: None   Collection Time: 02/07/18  6:45 PM  Result Value Ref Range   Alcohol, Ethyl (B) <10 <10 mg/dL    Comment: (NOTE) Lowest detectable limit for serum alcohol is 10 mg/dL. For medical purposes only. Performed at [facility], [address] [place], [place] [address]   TSH     Status: None   Collection Time: 02/07/18  6:45 PM  Result Value Ref Range   TSH 1.226 0.350 - 4.500 uIU/mL    Comment: Performed by a 3rd Generation assay with a functional sensitivity of <=0.01 uIU/mL. Performed at [facility], [address] [place], [place] [address]     Blood Alcohol level:  Lab Results  Component Value Date   ETH <10 02/07/2018   ETH <10 06/18/2017    Metabolic Disorder Labs: Lab Results  Component Value Date   HGBA1C 5.2 09/17/2015   MPG 103 09/17/2015   No results found for: PROLACTIN Lab Results  Component Value Date   CHOL 170 (H) 09/18/2015   TRIG 128 09/18/2015   HDL 55 09/18/2015   CHOLHDL 3.1 09/18/2015   VLDL 26 09/18/2015   LDLCALC 89 09/18/2015    Physical Findings: AIMS: Facial and Oral Movements Muscles of Facial Expression: None, normal Lips and Perioral Area: None, normal Jaw: None, normal Tongue: None, normal,Extremity Movements Upper (arms, wrists, hands, fingers): None, normal Lower (legs, knees, ankles, toes): None, normal, Trunk Movements Neck, shoulders, hips: None, normal, Overall Severity Severity of abnormal movements (highest score from questions above): None, normal Incapacitation due to abnormal movements: None, normal Patient's awareness of abnormal movements (rate only patient's report): No Awareness, Dental Status Current problems with teeth and/or dentures?: No Does patient usually wear dentures?: No  CIWA:  CIWA-Ar Total: 8 COWS:  COWS Total Score: 4  Musculoskeletal: Strength & Muscle Tone: within normal limits Gait & Station: normal Patient  leans: N/A  Psychiatric Specialty Exam: Physical Exam  Nursing note and vitals reviewed. Constitutional: She is oriented to person, place, and time. She appears well-developed and well-nourished.  HENT:  Head: Normocephalic and atraumatic.  Respiratory: Effort normal.  Neurological: She is alert and oriented to person, place, and time.    ROS  Blood pressure (!) 99/51, pulse  73, temperature 97.8 F (36.6 C), temperature source Oral, resp. rate 18, height 5\' 5"  (1.651 m), weight 54.4 kg, SpO2 100 %.Body mass index is 19.97 kg/m.  General Appearance: Casual  Eye Contact:  Fair  Speech:  Normal Rate  Volume:  Normal  Mood:  Anxious, Dysphoric and Irritable  Affect:  Congruent  Thought Process:  Coherent and Descriptions of Associations: Intact  Orientation:  Full (Time, Place, and Person)  Thought Content:  Logical  Suicidal Thoughts:  No  Homicidal Thoughts:  No  Memory:  Immediate;   Fair Recent;   Fair Remote;   Fair  Judgement:  Intact  Insight:  Fair  Psychomotor Activity:  Increased  Concentration:  Concentration: Fair and Attention Span: Fair  Recall:  [employer] of Knowledge:  Fair  Language:  Good  Akathisia:  Negative  Handed:  Right  AIMS (if indicated):     Assets:  Communication Skills Desire for Improvement Financial Resources/Insurance Housing Leisure Time Physical Health Resilience Social Support  ADL's:  Intact  Cognition:  WNL  Sleep:  Number of Hours: 5.5     Treatment Plan Summary: Daily contact with patient to assess and evaluate symptoms and progress in treatment, Medication management and Plan :Patient is seen and examined.  Patient is a 17 year old female with a past psychiatric history significant for benzodiazepine dependence, borderline personality disorder, posttraumatic stress disorder. Patient is more collectively improved today.  More composed.  Little bit less dramatic, histrionic.  Her vital signs are stable, she is afebrile.  She is  not showing gross withdrawal symptoms at this point.  She continues on the clinical [facility] withdrawal assessment scale, and when that scale is greater than 10 she has Librium that will be triggered for treatment. 1.  Continue Librium 25 mg p.o. every 6 hours as needed CIWA greater than 10. 2.  Restart Catapres 0.1 mg p.o. nightly for nightmares and flashbacks. 3.  Restart Seroquel 300 mg p.o. nightly for insomnia, mood stability and sleep. 4.  Start sertraline 25 mg p.o. daily for mood, anxiety and trauma symptoms. 5.  Restart gabapentin once dosage is confirmed. 6.  Restart Valtrex 500 mg p.o. daily for suppression 7.  Increase Zofran to 8 mg p.o. every 8 hours as needed nausea. 8.  Increase hydroxyzine to 50 mg p.o. every 6 hours as needed anxiety. 9.  Continue to discuss long-term treatment of substance abuse issues. 10.  Mag citrate for constipation. 11.  Awaiting chest x-ray results, continue albuterol inhalers as necessary, stop albuterol syrup. 12.  Disposition planning-in progress.   Antonieta Pert, MD 02/08/2018, 11:28 AM

## 2018-02-08 NOTE — Progress Notes (Signed)
Adult Psychoeducational Group Note  Date:  02/08/2018 Time:  1:20 PM  Group Topic/Focus:  Goals Group:   The focus of this group is to help patients establish daily goals to achieve during treatment and discuss how the patient can incorporate goal setting into their daily lives to aide in recovery.  Participation Level:  Active  Participation Quality:  Appropriate  Affect:  Appropriate  Cognitive:  Appropriate  Insight: Appropriate  Engagement in Group:  Engaged  Modes of Intervention:  Discussion  Additional Comments:  Pt attended morning group.  Lindsay Lara 02/08/2018, 1:20 PM

## 2018-02-08 NOTE — Progress Notes (Signed)
Pt in bathroom, sounds like Pt is throwing up. Writer and MHT did not witness any evidence of emesis.

## 2018-02-08 NOTE — Progress Notes (Signed)
D.  Pt tearful on approach, states "I need everything you can give me".  Pt somatic, states she is having a herpes outbreak and needs second dose of Valtrex.  Pt had dose at 1630 and will get next dose at 0800.  Pt also requested anxiety medication.  Pt states "I'm just trying to hold it together right now".  Pt did not feel well enough to attend evening wrap up group.  Pt endorses passive SI but contracts for safety on the unit.  Pt denies HI.  Pt states Seroquel gives her hallucinations unless she takes it right before bed.  A.  Support and encouragement offered, medication given as ordered  R.  Pt remains safe on unit, will continue to monitor.

## 2018-02-08 NOTE — Progress Notes (Signed)
D Pt is observed by this writer UAL on the 400 hall today. She tolerates this well. [facility] scheduled CX[facility] this morning ( results negative) . She wears her own clothes, she is well-groomed and she is cooperative. She speaks fluently about her poly drug dependence and addiction. Her verbage can be vulgar at times.     A She completed her daily assessment and on this she wrote she deneid SI today and she rated ehr depression, hopelessness and anxeity " 3/3/8", respectively. She is emdicated twice ( around 1030 and then at 1730,) for c/o " withdrawal""I'm coming off this stuff hard....please give me some prn's". She received prn librium 25 , zofran 4 , clonidine 0.2  and vistaril this am and then at dinner she received 50 vistaril, 25 librium, scheduled valtrex  neurontin 600, and zofran prn nausea. Her c/o agitatioon, withdrawal, skin crawling, tremulousness, jitteriness, anxiety , elevated BP, headache and nausea. pt offered pos feedback about maintaing her behavior, not exhibiting any acting out behaviors and for comunicating well with her nurse and care team today.     R Safety in place.

## 2018-02-08 NOTE — Progress Notes (Addendum)
Pt was observed sitting in hallway quietly refusing to go to room. Pt states she is having a panic attack. Writer informed Pt that she needed to use her coping mechanisms that she has learned. Pt was told she needed to go back to room and rest. Pt states she didn't want to wake her roommate up. Quiet room was offered and Pt declined.
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
## 2018-02-09 DIAGNOSIS — F332 Major depressive disorder, recurrent severe without psychotic features: Principal | ICD-10-CM

## 2018-02-09 DIAGNOSIS — F1320 Sedative, hypnotic or anxiolytic dependence, uncomplicated: Secondary | ICD-10-CM

## 2018-02-09 DIAGNOSIS — F419 Anxiety disorder, unspecified: Secondary | ICD-10-CM
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
## 2018-02-09 NOTE — BHH Group Notes (Signed)
Adult Psychoeducational Group Note Adult Psychoeducational Group Note  Date:  02/09/2018 Time:  1:15PM  Group Topic/Focus: Cognitive Behavioral Therapy Managing Feelings:   The focus of this group is to identify what feelings patients have difficulty handling and develop a plan to handle them in a healthier way upon discharge.  Participation Level:  Active  Participation Quality:  Appropriate, Attentive and Sharing  Affect:  Appropriate  Cognitive:  Appropriate  Insight: Appropriate  Engagement in Group:  Engaged and Supportive  Modes of Intervention:  Discussion, Education, Exploration and Socialization  Additional Comments:  Pt attended and participated during RN psycho-educational group.   Yonis Carreon C 02/09/2018, 2:40 PM  

## 2018-02-09 NOTE — Progress Notes (Signed)
[facility] Group Notes:  (Nursing/MHT/Case Management/Adjunct)  Date:  02/09/2018  Time: 2030 Type of Therapy:  wrap up group  Participation Level:  Active  Participation Quality:  Appropriate, Attentive and Sharing  Affect:  Irritable  Cognitive:  Appropriate  Insight:  Lacking  Engagement in Group:  Engaged  Modes of Intervention:  Clarification, Education and Support  Summary of Progress/Problems: Pt shared that her good for the day was that her mom and uncle visited. Pt shared that she feels that she should be programming on both 300 and 400 hall because she wants to get the most out of therapy here.  If patient could change any one thing about her life it would be not having borderline personality disorder. Pt is grateful for her two dogs, a lab and a pit bull.   Marcille BuffyMcNeil, Lindsay Lara S 02/09/2018, 9:25 PM
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
## 2018-02-09 NOTE — Plan of Care (Signed)
  Problem: Education: Goal: Knowledge of [facility] Education information/materials will improve Outcome: Progressing Goal: Emotional status will improve Outcome: Progressing   

## 2018-02-10 LAB — LIPID PANEL
Cholesterol: 157 mg/dL (ref 0–200)
HDL: 47 mg/dL (ref >40)
LDL Cholesterol: 80 mg/dL (ref 0–99)
Total CHOL/HDL Ratio: 3.3 RATIO
Triglycerides: 149 mg/dL (ref <150)
VLDL: 30 mg/dL (ref 0–40)

## 2018-02-10 LAB — HEMOGLOBIN A1C
Hgb A1c MFr Bld: 5 % (ref 4.8–5.6)
Mean Plasma Glucose: 96.8 mg/dL

## 2018-02-10 MED ORDER — HYDROXYZINE HCL 25 MG PO TABS
25.0000 mg | ORAL_TABLET | Freq: Three times a day (TID) | ORAL | Status: DC | PRN
  Administered 2018-02-10 – 2018-02-11 (×3): 25 mg via ORAL
  Filled 2018-02-10 (×2): qty 1

## 2018-02-10 MED ORDER — SERTRALINE HCL 50 MG PO TABS
50.0000 mg | ORAL_TABLET | Freq: Every day | ORAL | Status: DC
  Administered 2018-02-11: 50 mg via ORAL
  Filled 2018-02-10 (×3): qty 1

## 2018-02-10 MED ORDER — HYDROXYZINE HCL 25 MG PO TABS
ORAL_TABLET | ORAL | Status: AC
  Filled 2018-02-10: qty 1

## 2018-02-10 NOTE — Progress Notes (Signed)
Patient came to Clinical research associatewriter and requested her medication for anxiety. She received it and came back less than 40 mins reporting that it was not working and wanted her night medication. She reported that she should be taking her neurontin at bedtime along with her seroquel. Patient brought up conversation about an incident that happened earlier on day shift and how upset it made her. Writer suggested that she should not continue to talk about it and distract herself by putting that energy into something positive. She eventually went and colored for a while before going to bed. Safety maintained on unit with 15 min checks.

## 2018-02-10 NOTE — B[facility] Group Notes (Signed)
[facility] LCSW Group Therapy Note  02/10/2018  10:00-11:00AM  Type of Therapy and Topic:  Group Therapy:  Adding Supports Including Being Your Own Support  Participation Level:  Active   Description of Group:  Patients in this group were introduced to the concept that additional supports including self-support are an essential part of recovery.  A song entitled "I Need Help!" was played and a group discussion was held in reaction to the idea of needing to add supports.  A song entitled "My Own Hero" was played and a group discussion ensued in which patients stated they could relate to the song and it inspired them to realize they have be willing to help themselves in order to succeed, because other people cannot achieve sobriety or stability for them.  We discussed adding a variety of healthy supports to address the various needs in their lives.  A song was played called "I Know Where I've Been" toward the end of group and used to conduct an inspirational wrap-up to group of remembering how far they have already come in their journey.  Therapeutic Goals: 1)  demonstrate the importance of being a part of one's own support system 2)  discuss reasons people in one's life may eventually be unable to be continually supportive  3)  identify the patient's current support system and   4)  elicit commitments to add healthy supports and to become more conscious of being self-supportive   Summary of Patient Progress:  The patient expressed that she exercises and walks with her dog, spends time with her family as means of healthy self-support, but on the other hand she will self-sabotage and has lost several jobs by going in to work smelling like marijuana.   Therapeutic Modalities:   Motivational Interviewing Activity  Lynnell ChadMareida J Grossman-Orr
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
## 2018-02-10 NOTE — Progress Notes (Signed)
D: Patient continues to complain of anxiety.  She exhibits med seeking behavior.  She also asked MD if she could program on 300, after she had been told she could not.  Patient has been flirting with another patient on 300.  MD was informed that patient cannot go over to 300 because of that.  She rates her anxiety as 6.  Her goal to "get more information on staying sober long term."  Patient has been focused on one female on 400.  She has been constantly complaining about his "remarks."  She has been asked to focus on herself and her treatment. Patient denies any thoughn A: Continue to monitor medication management and MD orders.  Safety checks completed every 15 minutes per protocol.  Offer support and encouragement as needed.  R: Patient is receptive to staff; her behavior is appropriate.

## 2018-02-10 NOTE — Progress Notes (Signed)
Pt asked to speak to this writer 1:1. Pt reported that she is upset over another patients behavior earlier in the day and started to explain why. Pt was stopped and told that I already knew the details and that she should focus on her treatment and therapy and not another patients words that were not even directed towards her. Also other patient was counseled on his words and has not spoken in that manner again. Pt said that being around him caused her anxiety and anger and his lack of kindness causes her to feel hate. Pts feelings were acknowledged and pt was encouraged to walk, shower, color, and not be in his presence if she was feeling negatively. Pt wanted to focus on her anger again trying to tell me the account from earlier. Pt was monitored along with other patient. There was no more confrontation or incident between them.
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
## 2018-02-10 NOTE — Progress Notes (Signed)
The Writer would like to note that [facility]," but that this is not at all reflected in her very recent affect or behavior. The Writer has observed Pt exhibiting jubilant - and even boisterous - behavior in the dayroom, laughing and flirting with her female peers.

## 2018-02-11 MED ORDER — GABAPENTIN 300 MG PO CAPS: 600.0000 mg | ORAL_CAPSULE | Freq: Two times a day (BID) | ORAL | 0 refills | Status: DC

## 2018-02-11 MED ORDER — VALACYCLOVIR HCL 1 G PO TABS: 1000.0000 mg | ORAL_TABLET | Freq: Every day | ORAL | 0 refills | Status: DC

## 2018-02-11 MED ORDER — QUETIAPINE FUMARATE 300 MG PO TABS: 300.0000 mg | ORAL_TABLET | Freq: Every day | ORAL | 0 refills | Status: DC

## 2018-02-11 MED ORDER — HYDROXYZINE HCL 25 MG PO TABS: 25.0000 mg | ORAL_TABLET | Freq: Three times a day (TID) | ORAL | 0 refills | Status: DC | PRN

## 2018-02-11 MED ORDER — CLONIDINE HCL 0.2 MG PO TABS: 0.2000 mg | ORAL_TABLET | Freq: Three times a day (TID) | ORAL | 0 refills | Status: DC | PRN

## 2018-02-11 MED ORDER — NICOTINE POLACRILEX 2 MG MT GUM: 2.0000 mg | CHEWING_GUM | OROMUCOSAL | 0 refills | Status: DC | PRN

## 2018-02-11 MED ORDER — ALBUTEROL SULFATE HFA 108 (90 BASE) MCG/ACT IN AERS: 1.0000 | INHALATION_SPRAY | Freq: Four times a day (QID) | RESPIRATORY_TRACT | Status: DC | PRN

## 2018-02-11 MED ORDER — IBUPROFEN 600 MG PO TABS: 600.0000 mg | ORAL_TABLET | Freq: Four times a day (QID) | ORAL | 0 refills | Status: DC | PRN

## 2018-02-11 MED ORDER — TRAZODONE HCL 50 MG PO TABS: 50.0000 mg | ORAL_TABLET | Freq: Every evening | ORAL | 0 refills | Status: DC | PRN

## 2018-02-11 MED ORDER — LEVONORGEST-ETH ESTRAD 91-DAY 0.15-0.03 MG PO TABS: 1.0000 | ORAL_TABLET | Freq: Every day | ORAL | 0 refills | Status: DC

## 2018-02-11 MED ORDER — SERTRALINE HCL 50 MG PO TABS: 50.0000 mg | ORAL_TABLET | Freq: Every day | ORAL | 0 refills | Status: DC

## 2018-02-11 NOTE — Progress Notes (Signed)
D:  Patient's self inventory sheet, patient sleeps good, sleep medication helpful.  Fair appetite, normal energy level, good concentration.  Denied depression and hopeless, rated anxiety 8.  Denied withdrawals.  Then checked agitation, irritability.  Denied SI.  Physical problems, pain, headaches.  Physical pain, worst pain in past 24 hrs is #4, joints sore.  Pain medication helpful somewhat.  Goal is discharge.  Plans to set therapist appointments.  Does have discharge plans. A:  Medications administered per MD orders.  Emotional support and encouragement given patient. R:  Denied SI and HI, contracts for safety.  Denied A/V hallucinations.  Safety maintained with 15 minute checks.

## 2018-02-11 NOTE — Progress Notes (Addendum)
Discharge Note:  Patient discharged home with Dad.  Patient denied SI and HI.  Denied A/V hallucinations.  Denied pain.  Suicide prevention information given and discussed with patient who stated she understood and had no questions.  My3 App information given to patient also.  Patient stated she received all her belongings, clothing, misc items, toiletries, prescriptions, etc.  Patient stated [facility].  All required discharge information given to patient at discharge.

## 2018-02-11 NOTE — Progress Notes (Signed)
  [facility] :  Will you be returning to the same living situation after discharge:  Yes,  patient reports she is returning home with her father At discharge, do you have transportation home?: Yes,  patient reports that her father is picking her up at discharge Do you have the ability to pay for your medications: No.  Release of information consent forms completed and in the chart;  Patient's signature needed at discharge.  Patient to Follow up at: Follow-up Information    [facility] Go on 02/14/2018.   Why:  Your next hospital follow up appointment with Mariann BarterKellie Newsome is Thursday, 02/14/18 at 1:00p.  Contact information: [address] [place] [address] [place] [id number] [phone]           Next level of care provider [facility]:yes  Safety Planning and Suicide Prevention discussed: Yes,  with the patient's father  Have you used any form of tobacco in the last 30 days? (Cigarettes, Smokeless Tobacco, Cigars, and/or Pipes): Yes  Has patient been referred to the Quitline?: Patient refused referral  Patient has been referred for addiction treatment: Pt. refused referral  Maeola SarahJolan E Daron Breeding, LCSWA 02/11/2018, 11:59 AM
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
## 2018-02-11 NOTE — Progress Notes (Signed)
Patient ID: Lindsay Lara, female   DOB: Jul 08, 2000, 17 y.o.   MRN: [id number] DAR Note: Pt continue to be attention and med seeking. "Oh, it feels like my back is going to explode; Please give me anything I can get." Pt endorsed severe anxiety, depression and pain. Pt denied SI/HI. All patient's questions and concerns addressed. Support, encouragement, and safe environment provided. 15 mins safety check continue for Pt safety. Pt did attend AA meeting.

## 2018-02-11 NOTE — [facility] Suicide Risk Assessment (Signed)
[facility]   Principal Problem: MDD (major depressive disorder), recurrent severe, without psychosis (HCC) Discharge Diagnoses: Principal Problem:   MDD (major depressive disorder), recurrent severe, without psychosis (HCC)   Total Time spent with patient: 20 minutes  Musculoskeletal: Strength & Muscle Tone: within normal limits Gait & Station: normal Patient leans: N/A  Psychiatric Specialty Exam: Review of Systems  All other systems reviewed and are negative.   Blood pressure (!) 141/86, pulse 70, temperature 98.2 F (36.8 C), temperature source Oral, resp. rate 16, height 5\' 5"  (1.651 m), weight 54.4 kg, SpO2 100 %.Body mass index is 19.97 kg/m.  General Appearance: Casual  Eye Contact::  Good  Speech:  Normal Rate409  Volume:  Normal  Mood:  Euthymic  Affect:  Congruent  Thought Process:  Coherent and Descriptions of Associations: Intact  Orientation:  Full (Time, Place, and Person)  Thought Content:  Logical  Suicidal Thoughts:  No  Homicidal Thoughts:  No  Memory:  Immediate;   Fair Recent;   Fair Remote;   Fair  Judgement:  Intact  Insight:  Fair  Psychomotor Activity:  Increased  Concentration:  Fair  Recall:  FiservFair  Fund of Knowledge:Fair  Language: Good  Akathisia:  Negative  Handed:  Right  AIMS (if indicated):     Assets:  Communication Skills Desire for Improvement Financial Resources/Insurance Housing Leisure Time Physical Health Resilience Social Support  Sleep:  Number of Hours: 6.75  Cognition: WNL  ADL's:  Intact   Mental Status Per Nursing Assessment::   On Admission:  NA  Demographic Factors:  Adolescent or young adult, Caucasian and Unemployed  Loss Factors: NA  Historical Factors: Prior suicide attempts and Impulsivity  Risk Reduction Factors:   Living with another person, especially a relative, Positive social support and Positive therapeutic relationship  Continued Clinical Symptoms:  Severe  Anxiety and/or Agitation Depression:   Comorbid alcohol abuse/dependence Impulsivity Alcohol/Substance Abuse/Dependencies Personality Disorders:   Cluster B  Cognitive Features That Contribute To Risk:  None    Suicide Risk:  Minimal: No identifiable suicidal ideation.  Patients presenting with no risk factors but with morbid ruminations; may be classified as minimal risk based on the severity of the depressive symptoms  Follow-up Information    [facility] Go on 02/14/2018.   Why:  Your next hospital follow up appointment with Mariann BarterKellie Newsome is Thursday, 02/14/18 at 1:00p.  Contact information: [address] [place] [id number] [phone]           Plan Of Care/Follow-up recommendations:  Activity:  ad lib  Antonieta PertGreg Lawson Miyani Cronic, MD 02/11/2018, 12:23 PM

## 2018-02-11 NOTE — Progress Notes (Signed)
Recreation Therapy Notes  Date: 12.9.19 Time: 0930 Location: [address] Dayroom  Group Topic: Stress Management  Goal Area(s) Addresses:  Patient will verbalize importance of using healthy stress management.  Patient will identify positive emotions associated with healthy stress management.   Behavioral Response: Engaged  Intervention: Stress Management  Activity :  Guided Imagery.  LRT introduced the stress management technique of guided imagery.  LRT read a script for patients to visualize being at the beach.  Patients were to follow along as script was read.  Education:  Stress Management, Discharge Planning.   Education Outcome: Acknowledges edcuation/In group clarification offered/Needs additional education  Clinical Observations/Feedback: Pt attended and engaged in activity.    Caroll RancherMarjette Meredith Kilbride, LRT/CTRS         Caroll RancherLindsay, Pasqualina Colasurdo A 02/11/2018 12:24 PM

## 2018-03-21 ENCOUNTER — Emergency Department (HOSPITAL_COMMUNITY): Payer: PRIVATE HEALTH INSURANCE

## 2018-03-21 ENCOUNTER — Inpatient Hospital Stay (HOSPITAL_COMMUNITY)
Admission: EM | Admit: 2018-03-21 | Discharge: 2018-04-17 | DRG: 004 | Disposition: A | Discharge status: Other Acute Inpatient Hospital | Payer: PRIVATE HEALTH INSURANCE | Attending: Internal Medicine | Admitting: Internal Medicine

## 2018-03-21 DIAGNOSIS — T1491XA Suicide attempt, initial encounter: Secondary | ICD-10-CM

## 2018-03-21 DIAGNOSIS — R000 Tachycardia, unspecified: Secondary | ICD-10-CM | POA: Clinically undetermined

## 2018-03-21 DIAGNOSIS — F1910 Other psychoactive substance abuse, uncomplicated: Secondary | ICD-10-CM | POA: Diagnosis present

## 2018-03-21 DIAGNOSIS — Z4659 Encounter for fitting and adjustment of other gastrointestinal appliance and device: Secondary | ICD-10-CM

## 2018-03-21 DIAGNOSIS — J9600 Acute respiratory failure, unspecified whether with hypoxia or hypercapnia: Secondary | ICD-10-CM | POA: Diagnosis not present

## 2018-03-21 DIAGNOSIS — T447X2A Poisoning by beta-adrenoreceptor antagonists, intentional self-harm, initial encounter: Secondary | ICD-10-CM | POA: Diagnosis present

## 2018-03-21 DIAGNOSIS — X58XXXA Exposure to other specified factors, initial encounter: Secondary | ICD-10-CM | POA: Diagnosis not present

## 2018-03-21 DIAGNOSIS — T17890A Other foreign object in other parts of respiratory tract causing asphyxiation, initial encounter: Secondary | ICD-10-CM | POA: Diagnosis not present

## 2018-03-21 DIAGNOSIS — J9383 Other pneumothorax: Secondary | ICD-10-CM | POA: Diagnosis not present

## 2018-03-21 DIAGNOSIS — J80 Acute respiratory distress syndrome: Secondary | ICD-10-CM | POA: Diagnosis not present

## 2018-03-21 DIAGNOSIS — F1994 Other psychoactive substance use, unspecified with psychoactive substance-induced mood disorder: Secondary | ICD-10-CM | POA: Diagnosis not present

## 2018-03-21 DIAGNOSIS — G931 Anoxic brain damage, not elsewhere classified: Secondary | ICD-10-CM | POA: Diagnosis not present

## 2018-03-21 DIAGNOSIS — F1210 Cannabis abuse, uncomplicated: Secondary | ICD-10-CM | POA: Diagnosis present

## 2018-03-21 DIAGNOSIS — F1320 Sedative, hypnotic or anxiolytic dependence, uncomplicated: Secondary | ICD-10-CM | POA: Diagnosis present

## 2018-03-21 DIAGNOSIS — Y848 Other medical procedures as the cause of abnormal reaction of the patient, or of later complication, without mention of misadventure at the time of the procedure: Secondary | ICD-10-CM | POA: Diagnosis not present

## 2018-03-21 DIAGNOSIS — R451 Restlessness and agitation: Secondary | ICD-10-CM | POA: Diagnosis not present

## 2018-03-21 DIAGNOSIS — F445 Conversion disorder with seizures or convulsions: Secondary | ICD-10-CM | POA: Diagnosis present

## 2018-03-21 DIAGNOSIS — L8990 Pressure ulcer of unspecified site, unspecified stage: Secondary | ICD-10-CM

## 2018-03-21 DIAGNOSIS — F1120 Opioid dependence, uncomplicated: Secondary | ICD-10-CM | POA: Diagnosis not present

## 2018-03-21 DIAGNOSIS — R4182 Altered mental status, unspecified: Secondary | ICD-10-CM | POA: Diagnosis present

## 2018-03-21 DIAGNOSIS — Z793 Long term (current) use of hormonal contraceptives: Secondary | ICD-10-CM

## 2018-03-21 DIAGNOSIS — R109 Unspecified abdominal pain: Secondary | ICD-10-CM

## 2018-03-21 DIAGNOSIS — R001 Bradycardia, unspecified: Secondary | ICD-10-CM | POA: Diagnosis present

## 2018-03-21 DIAGNOSIS — F1410 Cocaine abuse, uncomplicated: Secondary | ICD-10-CM | POA: Diagnosis present

## 2018-03-21 DIAGNOSIS — K5900 Constipation, unspecified: Secondary | ICD-10-CM | POA: Diagnosis not present

## 2018-03-21 DIAGNOSIS — R0902 Hypoxemia: Secondary | ICD-10-CM | POA: Diagnosis not present

## 2018-03-21 DIAGNOSIS — R9401 Abnormal electroencephalogram [EEG]: Secondary | ICD-10-CM | POA: Diagnosis not present

## 2018-03-21 DIAGNOSIS — F419 Anxiety disorder, unspecified: Secondary | ICD-10-CM | POA: Diagnosis not present

## 2018-03-21 DIAGNOSIS — F913 Oppositional defiant disorder: Secondary | ICD-10-CM | POA: Diagnosis present

## 2018-03-21 DIAGNOSIS — F410 Panic disorder [episodic paroxysmal anxiety] without agoraphobia: Secondary | ICD-10-CM | POA: Diagnosis present

## 2018-03-21 DIAGNOSIS — G9340 Encephalopathy, unspecified: Secondary | ICD-10-CM | POA: Diagnosis not present

## 2018-03-21 DIAGNOSIS — J189 Pneumonia, unspecified organism: Secondary | ICD-10-CM

## 2018-03-21 DIAGNOSIS — T4275XA Adverse effect of unspecified antiepileptic and sedative-hypnotic drugs, initial encounter: Secondary | ICD-10-CM | POA: Diagnosis not present

## 2018-03-21 DIAGNOSIS — F1010 Alcohol abuse, uncomplicated: Secondary | ICD-10-CM | POA: Diagnosis present

## 2018-03-21 DIAGNOSIS — Z930 Tracheostomy status: Secondary | ICD-10-CM

## 2018-03-21 DIAGNOSIS — F329 Major depressive disorder, single episode, unspecified: Secondary | ICD-10-CM | POA: Diagnosis not present

## 2018-03-21 DIAGNOSIS — F1123 Opioid dependence with withdrawal: Secondary | ICD-10-CM | POA: Diagnosis not present

## 2018-03-21 DIAGNOSIS — G92 Toxic encephalopathy: Secondary | ICD-10-CM

## 2018-03-21 DIAGNOSIS — Z8249 Family history of ischemic heart disease and other diseases of the circulatory system: Secondary | ICD-10-CM

## 2018-03-21 DIAGNOSIS — Z91410 Personal history of adult physical and sexual abuse: Secondary | ICD-10-CM

## 2018-03-21 DIAGNOSIS — T17500A Unspecified foreign body in bronchus causing asphyxiation, initial encounter: Secondary | ICD-10-CM

## 2018-03-21 DIAGNOSIS — Y95 Nosocomial condition: Secondary | ICD-10-CM | POA: Diagnosis not present

## 2018-03-21 DIAGNOSIS — F314 Bipolar disorder, current episode depressed, severe, without psychotic features: Secondary | ICD-10-CM | POA: Diagnosis present

## 2018-03-21 DIAGNOSIS — Z430 Encounter for attention to tracheostomy: Secondary | ICD-10-CM | POA: Diagnosis not present

## 2018-03-21 DIAGNOSIS — Z811 Family history of alcohol abuse and dependence: Secondary | ICD-10-CM

## 2018-03-21 DIAGNOSIS — T43592A Poisoning by other antipsychotics and neuroleptics, intentional self-harm, initial encounter: Secondary | ICD-10-CM | POA: Diagnosis present

## 2018-03-21 DIAGNOSIS — T447X2S Poisoning by beta-adrenoreceptor antagonists, intentional self-harm, sequela: Secondary | ICD-10-CM | POA: Diagnosis not present

## 2018-03-21 DIAGNOSIS — J95851 Ventilator associated pneumonia: Secondary | ICD-10-CM | POA: Diagnosis not present

## 2018-03-21 DIAGNOSIS — F4325 Adjustment disorder with mixed disturbance of emotions and conduct: Secondary | ICD-10-CM | POA: Diagnosis present

## 2018-03-21 DIAGNOSIS — R9431 Abnormal electrocardiogram [ECG] [EKG]: Secondary | ICD-10-CM | POA: Diagnosis present

## 2018-03-21 DIAGNOSIS — D649 Anemia, unspecified: Secondary | ICD-10-CM

## 2018-03-21 DIAGNOSIS — F13239 Sedative, hypnotic or anxiolytic dependence with withdrawal, unspecified: Secondary | ICD-10-CM | POA: Diagnosis not present

## 2018-03-21 DIAGNOSIS — J939 Pneumothorax, unspecified: Secondary | ICD-10-CM

## 2018-03-21 DIAGNOSIS — T424X2A Poisoning by benzodiazepines, intentional self-harm, initial encounter: Secondary | ICD-10-CM | POA: Diagnosis present

## 2018-03-21 DIAGNOSIS — Z915 Personal history of self-harm: Secondary | ICD-10-CM

## 2018-03-21 DIAGNOSIS — I952 Hypotension due to drugs: Secondary | ICD-10-CM | POA: Diagnosis not present

## 2018-03-21 DIAGNOSIS — F603 Borderline personality disorder: Secondary | ICD-10-CM | POA: Diagnosis present

## 2018-03-21 DIAGNOSIS — J9809 Other diseases of bronchus, not elsewhere classified: Secondary | ICD-10-CM

## 2018-03-21 DIAGNOSIS — I9589 Other hypotension: Secondary | ICD-10-CM | POA: Diagnosis not present

## 2018-03-21 DIAGNOSIS — Z79899 Other long term (current) drug therapy: Secondary | ICD-10-CM

## 2018-03-21 DIAGNOSIS — I10 Essential (primary) hypertension: Secondary | ICD-10-CM | POA: Diagnosis present

## 2018-03-21 DIAGNOSIS — Z91040 Latex allergy status: Secondary | ICD-10-CM

## 2018-03-21 DIAGNOSIS — E876 Hypokalemia: Secondary | ICD-10-CM | POA: Diagnosis present

## 2018-03-21 DIAGNOSIS — J690 Pneumonitis due to inhalation of food and vomit: Secondary | ICD-10-CM | POA: Diagnosis not present

## 2018-03-21 DIAGNOSIS — A6000 Herpesviral infection of urogenital system, unspecified: Secondary | ICD-10-CM | POA: Diagnosis present

## 2018-03-21 DIAGNOSIS — R569 Unspecified convulsions: Secondary | ICD-10-CM | POA: Diagnosis not present

## 2018-03-21 DIAGNOSIS — M4120 Other idiopathic scoliosis, site unspecified: Secondary | ICD-10-CM | POA: Diagnosis present

## 2018-03-21 DIAGNOSIS — J9690 Respiratory failure, unspecified, unspecified whether with hypoxia or hypercapnia: Secondary | ICD-10-CM

## 2018-03-21 DIAGNOSIS — R609 Edema, unspecified: Secondary | ICD-10-CM | POA: Diagnosis not present

## 2018-03-21 DIAGNOSIS — R0603 Acute respiratory distress: Secondary | ICD-10-CM

## 2018-03-21 DIAGNOSIS — R509 Fever, unspecified: Secondary | ICD-10-CM

## 2018-03-21 DIAGNOSIS — F17210 Nicotine dependence, cigarettes, uncomplicated: Secondary | ICD-10-CM | POA: Diagnosis present

## 2018-03-21 DIAGNOSIS — R402431 Glasgow coma scale score 3-8, in the field [EMT or ambulance]: Secondary | ICD-10-CM

## 2018-03-21 DIAGNOSIS — F909 Attention-deficit hyperactivity disorder, unspecified type: Secondary | ICD-10-CM | POA: Diagnosis present

## 2018-03-21 DIAGNOSIS — F332 Major depressive disorder, recurrent severe without psychotic features: Secondary | ICD-10-CM | POA: Diagnosis not present

## 2018-03-21 DIAGNOSIS — T859XXA Unspecified complication of internal prosthetic device, implant and graft, initial encounter: Secondary | ICD-10-CM

## 2018-03-21 DIAGNOSIS — J9601 Acute respiratory failure with hypoxia: Secondary | ICD-10-CM | POA: Diagnosis not present

## 2018-03-21 DIAGNOSIS — Z978 Presence of other specified devices: Secondary | ICD-10-CM

## 2018-03-21 LAB — CBC WITH DIFFERENTIAL/PLATELET
Abs Immature Granulocytes: 0.06 10*3/uL (ref 0.00–0.07)
Basophils Absolute: 0 10*3/uL (ref 0.0–0.1)
Basophils Relative: 0 %
EOS PCT: 1 %
Eosinophils Absolute: 0 10*3/uL (ref 0.0–0.5)
HCT: 27.8 % — ABNORMAL LOW (ref 36.0–46.0)
Hemoglobin: 8.9 g/dL — ABNORMAL LOW (ref 12.0–15.0)
Immature Granulocytes: 1 %
LYMPHS PCT: 28 %
Lymphs Abs: 1.4 10*3/uL (ref 0.7–4.0)
MCH: 29.8 pg (ref 26.0–34.0)
MCHC: 32 g/dL (ref 30.0–36.0)
MCV: 93 fL (ref 80.0–100.0)
Monocytes Absolute: 0.3 10*3/uL (ref 0.1–1.0)
Monocytes Relative: 6 %
Neutro Abs: 3.1 10*3/uL (ref 1.7–7.7)
Neutrophils Relative %: 64 %
Platelets: 172 10*3/uL (ref 150–400)
RBC: 2.99 MIL/uL — ABNORMAL LOW (ref 3.87–5.11)
RDW: 12.8 % (ref 11.5–15.5)
WBC: 4.9 10*3/uL (ref 4.0–10.5)
nRBC: 0 % (ref 0.0–0.2)

## 2018-03-21 LAB — I-STAT ARTERIAL BLOOD GAS, ED
ACID-BASE EXCESS: 5 mmol/L — AB (ref 0.0–2.0)
Bicarbonate: 29 mmol/L — ABNORMAL HIGH (ref 20.0–28.0)
O2 Saturation: 100 %
Patient temperature: 98.6
TCO2: 30 mmol/L (ref 22–32)
pCO2 arterial: 37.6 mmHg (ref 32.0–48.0)
pH, Arterial: 7.495 — ABNORMAL HIGH (ref 7.350–7.450)
pO2, Arterial: 407 mmHg — ABNORMAL HIGH (ref 83.0–108.0)

## 2018-03-21 LAB — RAPID URINE DRUG SCREEN, HOSP PERFORMED
Amphetamines: NOT DETECTED
Barbiturates: NOT DETECTED
Benzodiazepines: POSITIVE — AB
Cocaine: NOT DETECTED
Opiates: NOT DETECTED
Tetrahydrocannabinol: POSITIVE — AB

## 2018-03-21 LAB — TROPONIN I: Troponin I: <0.03 ng/mL (ref <0.03)

## 2018-03-21 LAB — URINALYSIS, ROUTINE W REFLEX MICROSCOPIC
Bacteria, UA: NONE SEEN
Bilirubin Urine: NEGATIVE
Glucose, UA: NEGATIVE mg/dL
Hgb urine dipstick: NEGATIVE
Ketones, ur: 20 mg/dL — AB
Leukocytes, UA: NEGATIVE
Nitrite: NEGATIVE
PROTEIN: 30 mg/dL — AB
Specific Gravity, Urine: 1.013 (ref 1.005–1.030)
pH: 6 (ref 5.0–8.0)

## 2018-03-21 LAB — COMPREHENSIVE METABOLIC PANEL
ALT: 61 U/L — ABNORMAL HIGH (ref 0–44)
ANION GAP: 7 (ref 5–15)
AST: 108 U/L — ABNORMAL HIGH (ref 15–41)
Albumin: 2.2 g/dL — ABNORMAL LOW (ref 3.5–5.0)
Alkaline Phosphatase: 27 U/L — ABNORMAL LOW (ref 38–126)
BUN: 6 mg/dL (ref 6–20)
CO2: 22 mmol/L (ref 22–32)
Calcium: 9.7 mg/dL (ref 8.9–10.3)
Chloride: 111 mmol/L (ref 98–111)
Creatinine, Ser: 1.32 mg/dL — ABNORMAL HIGH (ref 0.44–1.00)
GFR calc Af Amer: >60 mL/min (ref >60)
GFR calc non Af Amer: 58 mL/min — ABNORMAL LOW (ref >60)
Glucose, Bld: 204 mg/dL — ABNORMAL HIGH (ref 70–99)
Potassium: 3.5 mmol/L (ref 3.5–5.1)
Sodium: 140 mmol/L (ref 135–145)
Total Bilirubin: 0.5 mg/dL (ref 0.3–1.2)
Total Protein: 3.7 g/dL — ABNORMAL LOW (ref 6.5–8.1)

## 2018-03-21 LAB — ACETAMINOPHEN LEVEL: Acetaminophen (Tylenol), Serum: <10 ug/mL — ABNORMAL LOW (ref 10–30)

## 2018-03-21 LAB — ETHANOL: Alcohol, Ethyl (B): <10 mg/dL (ref <10)

## 2018-03-21 LAB — CBG MONITORING, ED: Glucose-Capillary: 189 mg/dL — ABNORMAL HIGH (ref 70–99)

## 2018-03-21 LAB — SALICYLATE LEVEL

## 2018-03-21 LAB — I-STAT CG4 LACTIC ACID, ED: Lactic Acid, Venous: 2.65 mmol/L (ref 0.5–1.9)

## 2018-03-21 LAB — I-STAT BETA HCG BLOOD, ED (MC, WL, AP ONLY): I-stat hCG, quantitative: <5 m[IU]/mL (ref <5)

## 2018-03-21 MED ORDER — PANTOPRAZOLE SODIUM 40 MG IV SOLR
40.0000 mg | Freq: Every day | INTRAVENOUS | Status: DC
  Administered 2018-03-22 – 2018-03-29 (×8): 40 mg via INTRAVENOUS
  Filled 2018-03-21 (×9): qty 40

## 2018-03-21 MED ORDER — LACTATED RINGERS IV SOLN
INTRAVENOUS | Status: DC
  Administered 2018-03-22 (×2): via INTRAVENOUS
  Administered 2018-03-23: 75 mL/h via INTRAVENOUS
  Administered 2018-03-24 – 2018-03-26 (×4): via INTRAVENOUS

## 2018-03-21 MED ORDER — SODIUM CHLORIDE 0.9 % IV SOLN
250.0000 mL | INTRAVENOUS | Status: DC
  Administered 2018-04-09: 250 mL via INTRAVENOUS

## 2018-03-21 MED ORDER — ONDANSETRON HCL 4 MG/2ML IJ SOLN
4.0000 mg | Freq: Four times a day (QID) | INTRAMUSCULAR | Status: DC | PRN
  Administered 2018-04-07 – 2018-04-15 (×8): 4 mg via INTRAVENOUS
  Filled 2018-03-21 (×8): qty 2

## 2018-03-21 MED ORDER — SUCCINYLCHOLINE CHLORIDE 20 MG/ML IJ SOLN
INTRAMUSCULAR | Status: AC | PRN
  Administered 2018-03-21: 50 mg via INTRAVENOUS

## 2018-03-21 MED ORDER — ENOXAPARIN SODIUM 40 MG/0.4ML ~~LOC~~ SOLN
40.0000 mg | Freq: Every day | SUBCUTANEOUS | Status: DC
  Administered 2018-03-22 – 2018-04-04 (×14): 40 mg via SUBCUTANEOUS
  Filled 2018-03-21 (×15): qty 0.4

## 2018-03-21 MED ORDER — EPINEPHRINE PF 1 MG/ML IJ SOLN
0.5000 ug/min | INTRAVENOUS | Status: DC
  Administered 2018-03-21: 10 ug/min via INTRAVENOUS
  Filled 2018-03-21: qty 4

## 2018-03-21 MED ORDER — CALCIUM CHLORIDE 10 % IV SOLN
INTRAVENOUS | Status: AC | PRN
  Administered 2018-03-21: 1 g via INTRAVENOUS

## 2018-03-21 MED ORDER — SODIUM CHLORIDE 0.9 % IV SOLN
INTRAVENOUS | Status: DC | PRN
  Administered 2018-03-27: 10 mL/h via INTRA_ARTERIAL
  Administered 2018-03-29 – 2018-04-04 (×4): via INTRA_ARTERIAL

## 2018-03-21 MED ORDER — GLUCAGON HCL RDNA (DIAGNOSTIC) 1 MG IJ SOLR
1.0000 mg | Freq: Once | INTRAMUSCULAR | Status: AC
  Administered 2018-03-21: 1 mg via INTRAVENOUS
  Filled 2018-03-21: qty 1

## 2018-03-21 MED ORDER — ETOMIDATE 2 MG/ML IV SOLN
INTRAVENOUS | Status: AC | PRN
  Administered 2018-03-21: 10 mg via INTRAVENOUS

## 2018-03-21 MED ORDER — SODIUM BICARBONATE 8.4 % IV SOLN
INTRAVENOUS | Status: AC | PRN
  Administered 2018-03-21 (×2): 50 meq via INTRAVENOUS

## 2018-03-21 NOTE — Progress Notes (Signed)
Name Lindsay Lara  Location: [facility] [place]: Corrie Dandy paged to provide emotional support. When Chaplain arrived mom was at bed and Pt unresponsive. Dad on the way. Seems like mom would like some space so chaplain checks in and out appropriately.

## 2018-03-21 NOTE — ED Notes (Signed)
Pt's mom at bedside.

## 2018-03-21 NOTE — Progress Notes (Signed)
Patient transported from ED Trauma A to CT and back with no complications. 

## 2018-03-21 NOTE — ED Notes (Signed)
Notified Dr. Patria Mane of elevated lactic acid

## 2018-03-21 NOTE — ED Notes (Signed)
Pt to CT at this time.

## 2018-03-21 NOTE — ED Notes (Signed)
Returned from CT.  Pt remains unresponsive with no purposeful movement or resp.  Pupil remain at 16mm and non reactive.   Critical Care MD at bedside

## 2018-03-21 NOTE — ED Provider Notes (Addendum)
[facility] EMERGENCY DEPARTMENT Provider Note   CSN: [id number] Arrival date & time: 03/21/18  2046   History   Chief Complaint No chief complaint on file.   HPI Lindsay Lara is a 18 y.o. female.  HPI   Lindsay Lara is a 18 y.o. female with PMH of acne, ADHD, anxiety, bipolar disorder, depression, genital herpes, pneumonia, polysubstance abuse and scoliosis who presents unresponsive from EMS after she was last heard from around 5:30 PM.  She reportedly was found by her father who called EMS.  EMS reports they found empty bottles of propranolol and Seroquel as well as several other medications which did not appear to be empty.  Initial heart rate in the 40s per EMS with systolics in the 40s.  She was given 1 mg of IV glucagon by EMS prior to arrival with no change.  Started on epinephrine infusion by EMS prior to arrival (1 mg of 1:1000 epinephrine in a 250 cc bag).  Nonrebreather and nasal trumpet placed prior to arrival with no response to nasal adjunct.  Did initially require BVM per EMS report.  She reportedly left a note on her phone explaining that this was a suicide attempt.  Update: Dr. Patria Mane spoke to mother at around 9:15 PM and she reported that patient did not use benzos during month of December but this pas week had been very difficult for her. Increased risky behavior and drug use. Mom spoke to her 72 via text message and she responded with "I can't do this anymore" and then no longer responded to messages. Mom found her at patient's father's house around 2000. Mom reports patient is not on propranolol.   Past Medical History:  Diagnosis Date  . Acne   . ADHD (attention deficit hyperactivity disorder) 05/08/2012  . Anxiety   . Bipolar and related disorder (HCC) 09/18/2015  . Depression   . 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 Pneumonia at age 31  . Polysubstance abuse (HCC) 09/18/2015  . Scoliosis no treatment required  . Substance  induced mood disorder (HCC) 09/18/2015    Patient Active Problem List   Diagnosis Date Noted  . Acute respiratory failure (HCC)   . Intentional overdose of beta-adrenergic blocking drug (HCC) 03/21/2018  . 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 Severe bipolar I disorder, current or most recent episode depressed (HCC) 01/17/2017  . 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 Overdose of benzodiazepine 01/14/2017  . Borderline personality disorder in adult [facility]) 01/14/2017  . Self-inflicted laceration of wrist/arms c/w known h/o "cutting" 01/14/2017  . 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 Bipolar affective disorder, current episode hypomanic (HCC) 05/08/2016  . Contact dermatitis 11/12/2015  . 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 Chlamydia 09/21/2015  . Substance induced mood disorder (HCC) 09/18/2015  . Polysubstance abuse (HCC) 09/18/2015  . MDD (major depressive disorder) 09/17/2015  . Idiopathic scoliosis 05/05/2015  . Contraception 06/18/2012  . Suicidal ideation 05/08/2012  . ADHD (attention deficit hyperactivity disorder) 05/08/2012  . Oppositional defiant disorder 05/08/2012  . Parent-child relational problem 05/08/2012    No past surgical history on file.   OB History    Gravida  0   Para  0   Term  0   Preterm  0   AB  0   Living  0  SAB  0   TAB  0   Ectopic  0   Multiple  0   Live Births               Home Medications    Prior to Admission medications   Medication Sig Start Date End Date Taking? Authorizing Provider  cloNIDine (CATAPRES) 0.2 MG tablet Take 1 tablet (0.2 mg total) by mouth 3 (three) times daily as needed (anxiety). 02/11/18  Yes Armandina StammerNwoko, Agnes I, NP  valACYclovir (VALTREX) 1000 MG tablet Take 1 tablet (1,000 mg total) by mouth daily. May increase to 2 tablets twice  daily for 5 days during an outbreak. 02/11/18  Yes Armandina StammerNwoko, Agnes I, NP  albuterol (PROVENTIL HFA;VENTOLIN HFA) 108 (90 Base) MCG/ACT inhaler Inhale 1 puff into the lungs every 6 (six) hours as needed for wheezing or shortness of breath. Patient not taking: Reported on 03/21/2018 02/11/18   Armandina StammerNwoko, Agnes I, NP  gabapentin (NEURONTIN) 300 MG capsule Take 2 capsules (600 mg total) by mouth 2 (two) times daily. For agitation 02/11/18   Armandina StammerNwoko, Agnes I, NP  hydrOXYzine (ATARAX/VISTARIL) 25 MG tablet Take 1 tablet (25 mg total) by mouth 3 (three) times daily as needed for anxiety. 02/11/18   Armandina StammerNwoko, Agnes I, NP  ibuprofen (ADVIL,MOTRIN) 600 MG tablet Take 1 tablet (600 mg total) by mouth every 6 (six) hours as needed for fever, headache or moderate pain. (May buy from over the counter): For pain, fever or headaches. 02/11/18   Armandina StammerNwoko, Agnes I, NP  levonorgestrel-ethinyl estradiol (INTROVALE) 0.15-0.03 MG tablet Take 1 tablet by mouth daily. Birth control method. 02/11/18   Armandina StammerNwoko, Agnes I, NP  nicotine polacrilex (NICORETTE) 2 MG gum Take 1 each (2 mg total) by mouth as needed for smoking cessation. (May buy from over the counter): For smoking cessation 02/11/18   Armandina StammerNwoko, Agnes I, NP  QUEtiapine (SEROQUEL) 300 MG tablet Take 1 tablet (300 mg total) by mouth at bedtime. For mood control 02/11/18   Armandina StammerNwoko, Agnes I, NP  sertraline (ZOLOFT) 50 MG tablet Take 1 tablet (50 mg total) by mouth daily. For depression 02/12/18   Armandina StammerNwoko, Agnes I, NP  traZODone (DESYREL) 50 MG tablet Take 1 tablet (50 mg total) by mouth at bedtime as needed for sleep. 02/11/18   Sanjuana KavaNwoko, Agnes I, NP    Family History Family History  Problem Relation Age of Onset  . Alcoholism Father        and Paterna grandfather  . Heart disease Father   . Hyperlipidemia Father   . Drug abuse Paternal Uncle     Social History Social History   Tobacco Use  . Smoking status: Current Every Day Smoker    Packs/day: 0.50    Types: Cigarettes    Last attempt to  quit: 01/11/2016    Years since quitting: 2.1  . Smokeless tobacco: Never Used  Substance Use Topics  . Alcohol use: Yes    Comment: occasional  . Drug use: Yes    Types: Marijuana, Benzodiazepines, Cocaine    Comment: acid, xanax     Allergies   Latex   Review of Systems Review of Systems  Unable to perform ROS: Patient unresponsive     Physical Exam Updated Vital Signs BP (!) 120/99   Pulse 65   Resp 10   Ht 5\' 5"  (1.651 m)   SpO2 100%   BMI 19.97 kg/m   Physical Exam Vitals signs and nursing note reviewed. Exam conducted with a chaperone present.  Constitutional:      General: She is in acute distress.     Appearance: She is well-developed. She is ill-appearing.     Interventions: Cervical collar and nasal cannula in place.     Comments: Unresponsive, pale, nasal trumpet in place in the left naris.  HENT:     Head: Normocephalic and atraumatic.  Eyes:     Conjunctiva/sclera: Conjunctivae normal.  Neck:     Musculoskeletal: Neck supple.  Cardiovascular:     Rate and Rhythm: Regular rhythm. Bradycardia present.     Heart sounds: S1 normal and S2 normal. No murmur.  Pulmonary:     Effort: Bradypnea present. No respiratory distress.     Breath sounds: Normal breath sounds.  Abdominal:     Palpations: Abdomen is soft.    Genitourinary:    Comments: On initial secondary survey, visual exam only, there is a small amount of dark blood at the vaginal introitus.  No lacerations or abrasions, edema, or contusions noted. Skin:    General: Skin is cool and dry.     Coloration: Skin is pale.  Neurological:     Mental Status: She is unresponsive.     GCS: GCS eye subscore is 1. GCS verbal subscore is 1. GCS motor subscore is 1.      ED Treatments / Results  Labs (all labs ordered are listed, but only abnormal results are displayed) Labs Reviewed  CBC WITH DIFFERENTIAL/PLATELET - Abnormal; Notable for the following components:      Result Value   RBC 2.99  (*)    Hemoglobin 8.9 (*)    HCT 27.8 (*)    All other components within normal limits  COMPREHENSIVE METABOLIC PANEL - Abnormal; Notable for the following components:   Glucose, Bld 204 (*)    Creatinine, Ser 1.32 (*)    Total Protein 3.7 (*)    Albumin 2.2 (*)    AST 108 (*)    ALT 61 (*)    Alkaline Phosphatase 27 (*)    GFR calc non Af Amer 58 (*)    All other components within normal limits  ACETAMINOPHEN LEVEL - Abnormal; Notable for the following components:   Acetaminophen (Tylenol), Serum <10 (*)    All other components within normal limits  URINALYSIS, ROUTINE W REFLEX MICROSCOPIC - Abnormal; Notable for the following components:   Ketones, ur 20 (*)    Protein, ur 30 (*)    All other components within normal limits  RAPID URINE DRUG SCREEN, HOSP PERFORMED - Abnormal; Notable for the following components:   Benzodiazepines POSITIVE (*)    Tetrahydrocannabinol POSITIVE (*)    All other components within normal limits  CBG MONITORING, ED - Abnormal; Notable for the following components:   Glucose-Capillary 189 (*)    All other components within normal limits  I-STAT CG4 LACTIC ACID, ED - Abnormal; Notable for the following components:   Lactic Acid, Venous 2.65 (*)    All other components within normal limits  I-STAT ARTERIAL BLOOD GAS, ED - Abnormal; Notable for the following components:   pH, Arterial 7.495 (*)    pO2, Arterial 407.0 (*)    Bicarbonate 29.0 (*)    Acid-Base Excess 5.0 (*)    All other components within normal limits  URINE CULTURE  TROPONIN I  SALICYLATE LEVEL  ETHANOL  HIV ANTIBODY (ROUTINE TESTING W REFLEX)  I-STAT BETA HCG BLOOD, ED ([facility], [facility], [facility] ONLY)    EKG None  Radiology Ct Head Wo Contrast  Result Date: 03/21/2018 CLINICAL DATA:  18 year old female with altered mental status. EXAM: CT HEAD WITHOUT CONTRAST TECHNIQUE: Contiguous axial images were obtained from the base of the skull through the vertex without intravenous contrast.  COMPARISON:  Head CT dated 08/17/2017 FINDINGS: Brain: No evidence of acute infarction, hemorrhage, hydrocephalus, extra-axial collection or mass lesion/mass effect. Vascular: No hyperdense vessel or unexpected calcification. Skull: Normal. Negative for fracture or focal lesion. Sinuses/Orbits: Minimal mucoperiosteal thickening of paranasal sinuses. No air-fluid levels. The mastoid air cells are clear. Other: None IMPRESSION: Normal noncontrast CT of the brain. Electronically Signed   By: Elgie Collard M.D.   On: 03/21/2018 23:04   Dg Chest Portable 1 View  Result Date: 03/22/2018 CLINICAL DATA:  Central line placement EXAM: PORTABLE CHEST 1 VIEW COMPARISON:  03/21/2018 at 2119 hours FINDINGS: Endotracheal tube terminates 4 cm above the carina. Lungs are clear. No pleural effusion. Suspected trace left apical pneumothorax. Interval placement of a left subclavian venous catheter terminating at the cavoatrial junction. The heart is normal in size. Enteric tube courses into the stomach. IMPRESSION: Interval placement of left subclavian venous catheter terminating at the cavoatrial junction. Suspected trace left apical pneumothorax. Additional stable support apparatus as above. Electronically Signed   By: Charline Bills M.D.   On: 03/22/2018 00:00   Dg Chest Portable 1 View  Result Date: 03/21/2018 CLINICAL DATA:  18 year old female found unresponsive. Intubated. EXAM: PORTABLE CHEST 1 VIEW COMPARISON:  02/08/2018 and earlier. FINDINGS: Portable AP semi upright view at 2119 hours. Endotracheal tube tip is just above the level the clavicles. Enteric tube courses to the left upper quadrant at the site and the side hole is at the level of the gastric body. Lung volumes and mediastinal contours are normal. Allowing for portable technique the lungs are clear. No osseous abnormality identified. Paucity of bowel gas. IMPRESSION: 1. ETT tip just above the clavicles, could be advanced 1 cm for more optimal  placement. Enteric tube position satisfactory. 2. No cardiopulmonary abnormality. Electronically Signed   By: Odessa Fleming M.D.   On: 03/21/2018 21:31    Procedures Procedure Name: Intubation Date/Time: 03/22/2018 12:05 AM Performed by: Clarisa Fling, MD Pre-anesthesia Checklist: Suction available, Patient being monitored and Emergency Drugs available Oxygen Delivery Method: Non-rebreather mask Induction Type: Rapid sequence Ventilation: Nasal airway inserted- appropriate to patient size Laryngoscope Size: Glidescope Grade View: Grade I Nasal Tubes: Left Tube size: 7.5 mm Number of attempts: 1 Airway Equipment and Method: Rigid stylet and Video-laryngoscopy Placement Confirmation: ETT inserted through vocal cords under direct vision,  Positive ETCO2,  Breath sounds checked- equal and bilateral and CO2 detector Secured at: 24 cm Tube secured with: ETT holder      (including critical care time)  Medications Ordered in ED Medications  0.9 %  sodium chloride infusion (has no administration in time range)  EPINEPHrine (ADRENALIN) 4 mg in dextrose 5 % 250 mL (0.016 mg/mL) infusion (5 mcg/min Intravenous Rate/Dose Change 03/21/18 2230)  enoxaparin (LOVENOX) injection 40 mg (has no administration in time range)  pantoprazole (PROTONIX) injection 40 mg (has no administration in time range)  lactated ringers infusion (has no administration in time range)  ondansetron (ZOFRAN) injection 4 mg (has no administration in time range)  0.9 %  sodium chloride infusion (has no administration in time range)  sodium bicarbonate injection (50 mEq Intravenous Given 03/21/18 2101)  calcium chloride injection (1 g Intravenous Given 03/21/18 2052)  etomidate (AMIDATE) injection (10 mg Intravenous Given 03/21/18 2104)  succinylcholine (  ANECTINE) injection (50 mg Intravenous Given 03/21/18 2056)  glucagon (human recombinant) (GLUCAGEN) injection 1 mg (1 mg Intravenous Given 03/21/18 2339)     Initial  Impression / Assessment and Plan / ED Course  I have reviewed the triage vital signs and the nursing notes.  Pertinent labs & imaging results that were available during my care of the patient were reviewed by me and considered in my medical decision making (see chart for details).      MDM:  Imaging: Initial chest x-ray with ET tube around the level of the clavicles, about 4 cm from carina.  No cardiac or pulmonary abnormality.  CT head without contrast unremarkable.  ED Provider Interpretation of EKG: Sinus rhythm with a rate of 59 bpm, right axis deviation, no ST segment elevation or depression, pathologic T wave changes.  Prolonged QTC of 528.  QRS is narrow.  Labs: Glucose 199, ethanol negative, salicylate negative, Tylenol negative, troponin undetectable, CMP with creatinine elevated at 1.3 otherwise largely unremarkable (CO2 22 and gap 7), CBC with hemoglobin 8.9 with previous baseline around 13, ABG 7.4 9/37/407/29/UDS positive for benzos and THC, lactate 2.6, pregnancy negative, UA without evidence for infection  On initial evaluation, patient appears critically ill.  Afebrile and bradycardic in the 50s, hypotensive 60s over 40s via EMS monitor on arrival with initial blood pressure 80s systolic on our bedside monitor.  Map as low as 46 during initial resuscitation.  Unresponsive to all stimuli with GCS 3 as above.  Presents after ingestion of multiple drugs as detailed above.  On exam, patient is unresponsive with pupils about 2 to 3 mm and minimally responsive.  Nonrebreather maintained with nasal airway on arrival.  Additional IV access obtained including IO in the left tibia.  IV fluid bolus given and epinephrine infusion restarted at 15 mcg/min.  After blood pressure slightly decreased she was given 100 mcg of phenylephrine and initially attempted to intubate without medications the patient had increased mandibular tone.  Intubated according to procedure note above thereafter.  During  initial resuscitation, patient was given 1 g of IV calcium gluconate, 1 amp of bicarb.  Patient did have bruising over the right hip and small amount of vaginal bleeding on external exam.  FAST exam performed by myself at the bedside showing no free fluid in the abdomen or pelvis and no pericardial effusion.  Hypodynamic heart with no evidence for RV strain.  Chest x-ray shows slightly high ET tube which was advanced 2 cm.  Arterial line placed by respiratory therapy.  Epinephrine infusion slowly down titrated to around 5 mcg/min with map in the 90s.  Heart rate remained in the high 50s to 60s.  No difficulty with ventilator.  Patient required no sedation in the ED and continued to have no response to noxious stimuli including the endotracheal tube.  CT head shows no acute intracranial abnormality.  EKG shows bradycardia as above with prolonged QTC and normal PR/QRS.  She reportedly most likely took benzodiazepines which were positive on UDS (as well as THC), propranolol, Seroquel, and possibly other medications including her dog's trazodone.  Critical care team consulted and nursing spoke to poison control center.  At this time, no indication for additional pressors, glucagon, or other medications and what is likely multi drug overdose.  The patient did have significant hemoglobin drop from her baseline of around 13 to 8.9.  Platelets normal.  Low suspicion for DIC.  Suspect acute blood loss anemia.  No evidence for intra-abdominal bleeding or  intracranial bleed.  Pelvic exam performed by critical care team showing no laceration or continued bleeding in the ED.  Mom did report the patient stated "I accidentally injured myself" but was laughing about this earlier in the week.  Stated that she had been soaking tampons for the last couple of days.  Unsure if she was due to be on her menstrual cycle.  Pregnancy negative. Do not feel that blood transfusion is indicated at this time.  Patient remained in the ED  at time of planned admission to critical care team at time of transfer of care to overnight team.  Please see their notes for additional details.  The plan for this patient was discussed with Dr. Patria Mane who voiced agreement and who oversaw evaluation and treatment of this patient.    Final Clinical Impressions(s) / ED Diagnoses   Final diagnoses:  Acute respiratory failure, unspecified whether with hypoxia or hypercapnia (HCC)  Intentional overdose of beta-adrenergic blocking drug, initial encounter (HCC)  Glasgow coma scale total score 3-8, in the field (EMT or ambulance) [facility])  Acute anemia  Suicide attempt [facility])    ED Discharge Orders    None       Jaken Fregia, Sherryle Lis, MD 03/22/18 0005    Clarisa Fling, MD 03/22/18 Reola Mosher    Azalia Bilis, MD 03/22/18 [phone]

## 2018-03-21 NOTE — Procedures (Signed)
Arterial Catheter Insertion Procedure Note Lindsay Lara [id number] December 01, 2000  Procedure: Insertion of Arterial Catheter  Indications: Blood pressure monitoring and Frequent blood sampling  Procedure Details Consent: Risks of procedure as well as the alternatives and risks of each were explained to the (patient/caregiver).  Consent for procedure obtained. Time Out: Verified patient identification, verified procedure, site/side was marked, verified correct patient position, special equipment/implants available, medications/allergies/relevent history reviewed, required imaging and test results available.  Performed  Maximum sterile technique was used including antiseptics, cap, gloves, gown, hand hygiene, mask and sheet. Skin prep: Chlorhexidine; local anesthetic administered 20 gauge catheter was inserted into right radial artery using the Seldinger technique. ULTRASOUND GUIDANCE USED: NO Evaluation Blood flow good; BP tracing good. Complications: No apparent complications.   Lindsay Lara 03/21/2018

## 2018-03-21 NOTE — Procedures (Signed)
Central Venous Catheter Insertion Procedure Note Lindsay Lara [id number] September 03, 2000  Procedure: Insertion of Central Venous Catheter Indications: Assessment of intravascular volume, Drug and/or fluid administration and Frequent blood sampling  Procedure Details Consent: Risks of procedure as well as the alternatives and risks of each were explained to the (patient/caregiver).  Consent for procedure obtained. Time Out: Verified patient identification, verified procedure, site/side was marked, verified correct patient position, special equipment/implants available, medications/allergies/relevent history reviewed, required imaging and test results available.  Performed  Maximum sterile technique was used including antiseptics, cap, gloves, gown, hand hygiene, mask and sheet. Skin prep: Chlorhexidine; local anesthetic administered A antimicrobial bonded/coated triple lumen catheter was placed in the left subclavian vein using the Seldinger technique.  Evaluation Blood flow good Complications: No apparent complications Patient did tolerate procedure well. Chest X-ray ordered to verify placement.  CXR: normal.   Lindsay Guysahul Carrissa Taitano, PA - [facility] Pager: [phone]  or [phone] 03/21/2018, 11:59 PM

## 2018-03-21 NOTE — ED Triage Notes (Signed)
Pt to ED unresponsive by family.  Pt had several empty Rx's bottles by her side.  Pt was last heard from at 17:30 and pt had snoring resp at 18:30

## 2018-03-21 NOTE — H&P (Signed)
..   NAME:  Lindsay Lara, MRN:  [id number], DOB:  02/23/2001, LOS: 0 ADMISSION DATE:  03/21/2018, CONSULTATION DATE:  03/21/2018 REFERRING MD:  Carron Curie MD, CHIEF COMPLAINT:  AMS   Brief History   18 yr old female w/ PMHx ADHD, Anxiety, Bipolar depression, previous suicide attempts, Polysubstance abuse (cocaine, Marijuana, heroin and ETOH) presents via EMS after Mom found pt unresponsive. Bradycardic and hypotensive. Intubated. Given Glucagon. Started on epi gtt. PCCM consulted for Intentional OD (propanolol, xanax and seroquel  History of present illness   (obtained from EMR, parents of the patient, EMS and the acct of other providers)  18 yr old female with PMHx significant for ADHD, Anxiety, Bipolar depression,last seen by her Psychiatrist 2 weeks ago. She has a h/o cutting and of previous suicide attempts (last attempt one yr ago per Mom).   Pt currently lives with her dad (parents are divorced) and for the last week her parents report that the patient has used increasing amounts of "street Xanax."  Prior to this she voluntarily went to detox in early December 2019 for 3 weeks and was doing better per her Mother.  On 1/15 she told her mother that she had injured herself (implying vaginal injury) Mom states that she said it in a joking manner.  Mom and Dad both spoke to the patient throughout the day on 1/16. Mom states that she was texting her throughout the day.  - The last read text was 17:07pm- " I can't do this anymore" - Dad states that because the patient uses xanax and other substances he did not think her sleeping or snoring was unusual (18:30) .   - Mom became concerned after an hour had passed by and she was not awake or responding to text so she decided to go over and check in on her.[birth year]). The patient's mother met her unresponsive with blue lips and very decreased respirations. No evidence of vomiting or bleeding, no evidence of any trauma. She saw multiple open pill bottles on the  table in her room. While she was on the phone with 911 she noted that her daughter had a period of " jerking" that lasted 5 mins.   Per EDP documentation: EMS reports they found empty bottles of propranolol and Seroquel as well as several other medications which did not appear to be empty.  Initial heart rate in the 40s per EMS with systolics in the 96P.  She was given 1 mg of IV glucagon by EMS prior to arrival with no change.  Started on epinephrine infusion by EMS prior to arrival (1 mg of 1:1000 epinephrine in a 250 cc bag).  Nonrebreather and nasal trumpet placed prior to arrival with no response to nasal adjunct.  Did initially require BVM per EMS report.  She reportedly left a note on her phone explaining that this was a suicide attempt.  Past Medical History  .Marland Kitchen Active Ambulatory Problems    Diagnosis Date Noted  . Suicidal ideation 05/08/2012  . ADHD (attention deficit hyperactivity disorder) 05/08/2012  . Oppositional defiant disorder 05/08/2012  . Parent-child relational problem 05/08/2012  . Contraception 06/18/2012  . Idiopathic scoliosis 05/05/2015  . MDD (major depressive disorder) 09/17/2015  . Substance induced mood disorder ([place]) 09/18/2015  . Polysubstance abuse ([place]) 09/18/2015  . Chlamydia 09/21/2015  . Contact dermatitis 11/12/2015  . 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 Bipolar affective disorder, current episode hypomanic ([place]) 05/08/2016  . Overdose of  benzodiazepine 01/14/2017  . Borderline personality disorder in adult [facility]) 01/14/2017  . Self-inflicted laceration of wrist/arms c/w known h/o "cutting" 01/14/2017  . 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 Severe bipolar I disorder, current or most recent episode depressed ([place]) 01/17/2017  . Moderate benzodiazepine use disorder ([place]) 01/21/2017  . MDD (major depressive disorder), recurrent severe, without psychosis  ([place]) 02/07/2018   Resolved Ambulatory Problems    Diagnosis Date Noted  . Depression 05/08/2012  . Bipolar and related disorder ([place]) 09/18/2015  . Decreased appetite 09/22/2015  . Pharyngitis 11/12/2015  . Genital herpes 11/12/2015  . Major depressive disorder, recurrent severe without psychotic features ([place]) 05/08/2016  . Substance abuse ([place]) 01/14/2017   Past Medical History:  Diagnosis Date  . Acne   . Anxiety   . Pneumonia at age 83  . Scoliosis no treatment required    Consults:  PCCM for admission 03/21/2018  Procedures:  Endotracheally intubated 03/21/2018 CVL placement 03/21/2018  Significant Diagnostic Tests:  03/21/2018 CTH w/o contrast : No evidence of acute infarction, hemorrhage, hydrocephalus, extra-axial collection or mass lesion/mass effect  EKG Date: 03/22/2018  Rate: 59  Rhythm: normal sinus rhythm  QRS Axis: normal QTC: 528  ST/T Wave abnormalities: normal  Conduction Disutrbances: RBBB  Narrative Interpretation: artifact present, poor baseline  Old EKG Reviewed: 01/01/2018- HR 78 bpm sinus with RVH Qtc 449  Micro Data:  No cx drawn  Antimicrobials:  none   Found at pt's bedside at home:         Objective   Blood pressure (!) 126/106, pulse 61, resp. rate 18, height 5' 5"  (1.651 m), SpO2 100 %.    Vent Mode: PRVC FiO2 (%):  [40 %] 40 % Set Rate:  [18 bmp] 18 bmp Vt Set:  [450 mL] 450 mL PEEP:  [5 cmH20] 5 cmH20 Plateau Pressure:  [25 cmH20] 25 cmH20   Intake/Output Summary (Last 24 hours) at 03/21/2018 2335 Last data filed at 03/21/2018 2320 Gross per 24 hour  Intake 2000 ml  Output -  Net 2000 ml   There were no vitals filed for this visit.  Examination: General: thin female unresponsive not non continuous sedation, pale HENT: NCAT ETT and OGT in place Lungs: CTAB Cardiovascular: S1 and S2 appreciated slow rate Abdomen: soft non distended scaphoid abdomen with hypoactive BS Extremities: LUE old scars from cutting,  multile tattoos, bruising over right hip Neuro: Pupils equal 3 non reactive. No gag, initially not withdrawal from painful stimuli now withdrawing from noxious stimuli L>R. Opens eyes GU: indwelling foley catheter. Pelvic exam: ext exam no visible warts, lacerations or evidence of injury. Tampon in place. Indwelling foley noted in urethra. Removed tampon for speculum exam. able to visualize cervix, no purulent discharge noted, blood noted coming from cervical os, used swab to absorb blood, no evidence of injury to the vaginal walls  Assessment & Plan:  1. Acute Encephalopathy- Toxic - CTH negative - started waking up and localizing to noxious stimuli around 1204AM 03/22/2018. Of note: Left side movement > Right. Suspected OD on propanolol, seroquel and "street" xanax UTox + Benzo and THC Plan: - continue off of sedation - no reversal of benzodiazepine due to risk of ppt seizure activity - Will continue frequent Neruochecks - discussed with Neurology given h/o polypharmacy and the witnessed seizure like activity coupled with decreased arousal ? Status EEG was initially considered but will hold at this time as she is becoming more responsive. - If right  sided motor strength and movement does not improve she may warrant an MRI.   2. Acute Respiratory Depression from intentional overdose - endotracheally intubated on PRVC Plan: TV 8cc/kg FiO2 40% and PEEP 5 Once pt becomes more alert and can follow commands will assess for SBT at that time  3.Intentional Overdose - Suicidal Ideation  Unclear as to what exactly patient ingested or how much I personally went through patient's home medication bottles Several were labelled from Pharmacy Rx but contanied various pills There was a plastic bag with several different colored pills which mother confirmed as "street drugs" Pt wrote a suicide letter on her phone- this was planned Plan: If pt is extubated she will need a full Psych eval 1:1 patient  watch due to her danger to self  4. Normochromic Normocytic Anemia Hgb baseline 13 current is 8 Vaginal exam reveals normal bleeding I personally reviewed her birth control pills and it appears she may not have been taking them consistently  Most likely this is her monthly menstrual  No transfusion at this time.  5. Anion Gap Metabolic Acidosis with a Non Anion Gap metabolic acidosis LA 8.31 Salicylates <7 BG 517- post glucagon Hypokalemia on BMP Plan: Continue on LR IVF at 75cc/hr Indwelling foley catheter to monitor UOP Replace potassium goal K+ >4  6. Hypotensive and Bradycardic Secondary to propanolol OD Given Glucagon 19m iv x 2 Received Calcium Iv and Mg Started on Epi gtt Has a Left subclav CVL in place Plan: Will d/c I/O in LLE Continue on Cardiac monitoring As MAP and HR improves will wean Epi gtt off  7. H/o ETOH use ETOH level  Plan: Start on Thiamine and Folic Acid   8. H/o Tobacco use Pt is an active smoker Plan:  Nicotine patch  9. High Risk behavior Pt continues to engage in high risk behavior per parents H/o genital herpes on Valtrex Plan: Check HIV, Hepatitis, STI panel Continue Valtrex ( or Acyclovir BID) Best practice:  Diet: NPO Pain/Anxiety/Delirium protocol (if indicated): holding sedation at this time to assess neurological status VAP protocol (if indicated): yes DVT prophylaxis: Lovenox  GI prophylaxis: PPI Glucose control: if BG exceeds >1817mdl will start ISS  Mobility: bedrest Code Status: FULL Family Communication: Mom and Dad at bedside pt lives with Dad, Mom checks in on her and communicates frequently.  Disposition: Admit to ICU  Labs   CBC: Recent Labs  Lab 03/21/18 2106  WBC 4.9  NEUTROABS 3.1  HGB 8.9*  HCT 27.8*  MCV 93.0  PLT 17616  Basic Metabolic Panel: Recent Labs  Lab 03/21/18 2106  NA 140  K 3.5  CL 111  CO2 22  GLUCOSE 204*  BUN 6  CREATININE 1.32*  CALCIUM 9.7   GFR: CrCl cannot be  calculated (Unknown ideal weight.). Recent Labs  Lab 03/21/18 2106 03/21/18 2122  WBC 4.9  --   LATICACIDVEN  --  2.65*    Liver Function Tests: Recent Labs  Lab 03/21/18 2106  AST 108*  ALT 61*  ALKPHOS 27*  BILITOT 0.5  PROT 3.7*  ALBUMIN 2.2*   No results for input(s): LIPASE, AMYLASE in the last 168 hours. No results for input(s): AMMONIA in the last 168 hours.  ABG    Component Value Date/Time   PHART 7.495 (H) 03/21/2018 2125   PCO2ART 37.6 03/21/2018 2125   PO2ART 407.0 (H) 03/21/2018 2125   HCO3 29.0 (H) 03/21/2018 2125   TCO2 30 03/21/2018 2125   O2SAT  100.0 03/21/2018 2125     Coagulation Profile: No results for input(s): INR, PROTIME in the last 168 hours.  Cardiac Enzymes: Recent Labs  Lab 03/21/18 2106  TROPONINI <0.03    HbA1C: Hgb A1c MFr Bld  Date/Time Value Ref Range Status  02/10/2018 06:55 AM 5.0 4.8 - 5.6 % Final    Comment:    (NOTE) Pre diabetes:          5.7%-6.4% Diabetes:              >6.4% Glycemic control for   <7.0% adults with diabetes   09/17/2015 06:42 PM 5.2 4.8 - 5.6 % Final    Comment:    (NOTE)         Pre-diabetes: 5.7 - 6.4         Diabetes: >6.4         Glycemic control for adults with diabetes: <7.0     CBG: Recent Labs  Lab 03/21/18 2059  GLUCAP 189*    Review of Systems:   Marland KitchenMarland KitchenReview of Systems  Unable to perform ROS: Critical illness   Past Medical History  She,  has a past medical history of Acne, ADHD (attention deficit hyperactivity disorder) (05/08/2012), Anxiety, Bipolar and related disorder ([place]) (09/18/2015), Depression, Genital HSV (2016 and 2017), Pneumonia (at age 89), Polysubstance abuse ([place]) (09/18/2015), Scoliosis (no treatment required), and Substance induced mood disorder ([place]) (09/18/2015).   Surgical History   No past surgical history on file.   Social History   reports that she has been smoking cigarettes. She has been smoking about 0.50 packs per day. She has never used smokeless  tobacco. She reports current alcohol use. She reports current drug use. Drugs: Marijuana, Benzodiazepines, and Cocaine.   Family History   Her family history includes Alcoholism in her father; Drug abuse in her paternal uncle; Heart disease in her father; Hyperlipidemia in her father.   Allergies Allergies  Allergen Reactions  . Latex Other (See Comments)    Irritation      Home Medications  Prior to Admission medications   Medication Sig Start Date End Date Taking? Authorizing Provider  cloNIDine (CATAPRES) 0.2 MG tablet Take 1 tablet (0.2 mg total) by mouth 3 (three) times daily as needed (anxiety). 02/11/18  Yes Lindell Spar I, NP  valACYclovir (VALTREX) 1000 MG tablet Take 1 tablet (1,000 mg total) by mouth daily. May increase to 2 tablets twice daily for 5 days during an outbreak. 02/11/18  Yes Lindell Spar I, NP  albuterol (PROVENTIL HFA;VENTOLIN HFA) 108 (90 Base) MCG/ACT inhaler Inhale 1 puff into the lungs every 6 (six) hours as needed for wheezing or shortness of breath. Patient not taking: Reported on 03/21/2018 02/11/18   Lindell Spar I, NP  gabapentin (NEURONTIN) 300 MG capsule Take 2 capsules (600 mg total) by mouth 2 (two) times daily. For agitation 02/11/18   Lindell Spar I, NP  hydrOXYzine (ATARAX/VISTARIL) 25 MG tablet Take 1 tablet (25 mg total) by mouth 3 (three) times daily as needed for anxiety. 02/11/18   Lindell Spar I, NP  ibuprofen (ADVIL,MOTRIN) 600 MG tablet Take 1 tablet (600 mg total) by mouth every 6 (six) hours as needed for fever, headache or moderate pain. (May buy from over the counter): For pain, fever or headaches. 02/11/18   Lindell Spar I, NP  levonorgestrel-ethinyl estradiol (INTROVALE) 0.15-0.03 MG tablet Take 1 tablet by mouth daily. Birth control method. 02/11/18   Lindell Spar I, NP  nicotine polacrilex (NICORETTE) 2 MG gum  Take 1 each (2 mg total) by mouth as needed for smoking cessation. (May buy from over the counter): For smoking cessation 02/11/18    Lindell Spar I, NP  QUEtiapine (SEROQUEL) 300 MG tablet Take 1 tablet (300 mg total) by mouth at bedtime. For mood control 02/11/18   Lindell Spar I, NP  sertraline (ZOLOFT) 50 MG tablet Take 1 tablet (50 mg total) by mouth daily. For depression 02/12/18   Lindell Spar I, NP  traZODone (DESYREL) 50 MG tablet Take 1 tablet (50 mg total) by mouth at bedtime as needed for sleep. 02/11/18   Encarnacion Slates, NP      I, Dr Seward Carol have personally reviewed patient's available data, including medical history, events of note, physical examination and test results as part of my evaluation. I have discussed with PCCM care team.  In addition,  I personally evaluated patient The patient is critically ill with multiple organ systems failure and requires high complexity decision making for assessment and support, frequent evaluation and titration of therapies, application of advanced monitoring technologies and extensive interpretation of multiple databases.   Critical Care Time devoted to patient care services described in this note is 65  Minutes. This time reflects time of care of this signee Dr Seward Carol. This critical care time does not reflect procedure time, or teaching time or supervisory time but could involve care discussion time    Critical care time: 65 mins     Dr. Seward Carol Pulmonary Critical Care Medicine  03/21/2018 11:35 PM

## 2018-03-22 ENCOUNTER — Inpatient Hospital Stay (HOSPITAL_COMMUNITY): Payer: PRIVATE HEALTH INSURANCE

## 2018-03-22 DIAGNOSIS — G9340 Encephalopathy, unspecified: Secondary | ICD-10-CM

## 2018-03-22 DIAGNOSIS — J9601 Acute respiratory failure with hypoxia: Secondary | ICD-10-CM

## 2018-03-22 DIAGNOSIS — J9600 Acute respiratory failure, unspecified whether with hypoxia or hypercapnia: Secondary | ICD-10-CM | POA: Insufficient documentation

## 2018-03-22 LAB — HIV ANTIBODY (ROUTINE TESTING W REFLEX): HIV Screen 4th Generation wRfx: NONREACTIVE

## 2018-03-22 LAB — GLUCOSE, CAPILLARY
Glucose-Capillary: 178 mg/dL — ABNORMAL HIGH (ref 70–99)
Glucose-Capillary: 129 mg/dL — ABNORMAL HIGH (ref 70–99)

## 2018-03-22 LAB — RPR: RPR Ser Ql: NONREACTIVE

## 2018-03-22 LAB — MRSA PCR SCREENING: MRSA by PCR: NEGATIVE

## 2018-03-22 LAB — LACTIC ACID, PLASMA: Lactic Acid, Venous: 1.4 mmol/L (ref 0.5–1.9)

## 2018-03-22 MED ORDER — FOLIC ACID 5 MG/ML IJ SOLN
1.0000 mg | Freq: Every day | INTRAMUSCULAR | Status: DC
  Administered 2018-03-22 – 2018-03-28 (×8): 1 mg via INTRAVENOUS
  Filled 2018-03-22 (×11): qty 0.2

## 2018-03-22 MED ORDER — CHLORHEXIDINE GLUCONATE 0.12% ORAL RINSE (MEDLINE KIT)
15.0000 mL | Freq: Two times a day (BID) | OROMUCOSAL | Status: DC
  Administered 2018-03-22 – 2018-04-05 (×27): 15 mL via OROMUCOSAL

## 2018-03-22 MED ORDER — FENTANYL 2500MCG IN NS 250ML (10MCG/ML) PREMIX INFUSION
0.0000 ug/h | INTRAVENOUS | Status: DC
  Administered 2018-03-22: 50 ug/h via INTRAVENOUS
  Filled 2018-03-22: qty 250

## 2018-03-22 MED ORDER — FENTANYL CITRATE (PF) 100 MCG/2ML IJ SOLN
12.5000 ug | INTRAMUSCULAR | Status: DC | PRN
  Administered 2018-03-22 (×10): 25 ug via INTRAVENOUS
  Filled 2018-03-22 (×3): qty 2

## 2018-03-22 MED ORDER — ORAL CARE MOUTH RINSE
15.0000 mL | OROMUCOSAL | Status: DC
  Administered 2018-03-22 – 2018-04-03 (×116): 15 mL via OROMUCOSAL

## 2018-03-22 MED ORDER — MIDAZOLAM HCL 2 MG/2ML IJ SOLN
2.0000 mg | Freq: Once | INTRAMUSCULAR | Status: AC
  Administered 2018-03-22: 2 mg via INTRAVENOUS

## 2018-03-22 MED ORDER — VALGANCICLOVIR HCL 50 MG/ML PO SOLR: 900.0000 mg | Freq: Every day | ORAL | Status: DC

## 2018-03-22 MED ORDER — FENTANYL CITRATE (PF) 100 MCG/2ML IJ SOLN
12.5000 ug | INTRAMUSCULAR | Status: DC | PRN
  Administered 2018-03-22: 12.5 ug via INTRAVENOUS
  Filled 2018-03-22: qty 2

## 2018-03-22 MED ORDER — ACYCLOVIR 200 MG/5ML PO SUSP
400.0000 mg | Freq: Two times a day (BID) | ORAL | Status: DC
  Administered 2018-03-22 – 2018-04-10 (×31): 400 mg
  Filled 2018-03-22 (×39): qty 10

## 2018-03-22 MED ORDER — THIAMINE HCL 100 MG/ML IJ SOLN
100.0000 mg | Freq: Every day | INTRAMUSCULAR | Status: DC
  Administered 2018-03-22 – 2018-03-29 (×9): 100 mg via INTRAVENOUS
  Filled 2018-03-22 (×9): qty 2

## 2018-03-22 MED ORDER — FENTANYL 2500MCG IN NS 250ML (10MCG/ML) PREMIX INFUSION
0.0000 ug/h | INTRAVENOUS | Status: DC
  Administered 2018-03-22: 75 ug/h via INTRAVENOUS
  Administered 2018-03-22: 50 ug/h via INTRAVENOUS
  Administered 2018-03-23: 100 ug/h via INTRAVENOUS
  Administered 2018-03-24: 250 ug/h via INTRAVENOUS
  Administered 2018-03-24: 375 ug/h via INTRAVENOUS
  Administered 2018-03-24: 350 ug/h via INTRAVENOUS
  Administered 2018-03-25: 375 ug/h via INTRAVENOUS
  Administered 2018-03-25: 200 ug/h via INTRAVENOUS
  Administered 2018-03-25: 250 ug/h via INTRAVENOUS
  Administered 2018-03-26 – 2018-03-27 (×5): 400 ug/h via INTRAVENOUS
  Administered 2018-03-27: 300 ug/h via INTRAVENOUS
  Administered 2018-03-27: 200 ug/h via INTRAVENOUS
  Administered 2018-03-28 – 2018-03-29 (×7): 400 ug/h via INTRAVENOUS
  Filled 2018-03-22 (×19): qty 250

## 2018-03-22 MED ORDER — NICOTINE 14 MG/24HR TD PT24
14.0000 mg | MEDICATED_PATCH | Freq: Every day | TRANSDERMAL | Status: DC
  Administered 2018-03-22 – 2018-03-28 (×6): 14 mg via TRANSDERMAL
  Filled 2018-03-22 (×10): qty 1

## 2018-03-22 MED ORDER — MIDAZOLAM HCL 2 MG/2ML IJ SOLN
INTRAMUSCULAR | Status: AC
  Administered 2018-03-22: 2 mg via INTRAVENOUS
  Filled 2018-03-22: qty 2

## 2018-03-22 NOTE — ED Provider Notes (Signed)
IO LINE INSERTION Date/Time: 03/21/2018 12:33 AM Performed by: Azalia Bilis, MD Authorized by: Azalia Bilis, MD   Consent:    Consent obtained:  Emergent situation Pre-procedure details:    Site preparation:  Chlorhexidine   Preparation: Patient was prepped and draped in usual sterile fashion   Anesthesia (see MAR for exact dosages):    Anesthesia method:  None Procedure details:    Insertion site:  L proximal tibia   Insertion device:  18 gauge IO needle and drill device   Insertion: Needle was inserted through the bony cortex     Number of attempts:  1   Insertion confirmation:  Aspiration of blood/marrow, easy infusion of fluids and stability of the needle Post-procedure details:    Secured with:  Protective shield and transparent dressing   Patient tolerance of procedure:  Tolerated well, no immediate complications .Critical Care Performed by: Azalia Bilis, MD Authorized by: Azalia Bilis, MD   Critical care provider statement:    Critical care time (minutes):  75   Critical care was time spent personally by me on the following activities:  Discussions with consultants, evaluation of patient's response to treatment, examination of patient, ordering and performing treatments and interventions, ordering and review of laboratory studies, ordering and review of radiographic studies, pulse oximetry, re-evaluation of patient's condition, obtaining history from patient or surrogate and review of old charts      Azalia Bilis, MD 03/22/18 [phone]

## 2018-03-22 NOTE — Consult Note (Addendum)
Neurology Consultation  Reason for Consult: Concern for seizure activity, altered mental status, drug overdose Referring Physician: Dr. Carlota Raspberry  CC: Altered mental status  History is obtained from: PCCM team, chart review  HPI: Lindsay Lara is a 18 y.o. female past medical history of major depression, bipolar disorder, anxiety, ADHD, polysubstance abuse, genital herpes, previous suicide attempts, brought to the emergency room after being found unresponsive at home with bottles of medication including propranolol and Seroquel with presumed overdose. Patient's not able to provide any history.  Family is not at bedside at this time.  History is gathered from the PCCM team.reports that patient presumably wrote a suicide note of some sort on her cell phone and overdosed on medications. She had been in the recent past few weeks using a lot of Xanax brought off of streets because of worsening psychiatric symptoms.  In addition, she has a history of cocaine and heroin abuse in the past. Patient required emergent intubation.  She was difficult to arouse and not waking up after the paralytics should have worn off and there was a concern if she had a seizure because mother also reported some sort of shaking when she was found unresponsive.  Course per EMS and in the ED: Found unresponsive, systolic blood pressure in the 40s to 50s, extremely bradycardic, no loss of pulse reported, brought to the ER, intubated, started on pressors, currently vitals normalizing.  Bottles found by EMS included propranolol, Seroquel, gabapentin.  Unclear last known well-?  5:30 PM on 03/21/2018 Modified Rankin-0   ROS:  Unable to obtain due to altered mental status.   Past Medical History:  Diagnosis Date  . Acne   . ADHD (attention deficit hyperactivity disorder) 05/08/2012  . Anxiety   . Bipolar and related disorder (HCC) 09/18/2015  . Depression   . 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 Pneumonia at  age 29  . Polysubstance abuse (HCC) 09/18/2015  . Scoliosis no treatment required  . Substance induced mood disorder (HCC) 09/18/2015    Family History  Problem Relation Age of Onset  . Alcoholism Father        and Paterna grandfather  . Heart disease Father   . Hyperlipidemia Father   . Drug abuse Paternal Uncle    Social History:   reports that she has been smoking cigarettes. She has been smoking about 0.50 packs per day. She has never used smokeless tobacco. She reports current alcohol use. She reports current drug use. Drugs: Marijuana, Benzodiazepines, and Cocaine.  Medications  Current Facility-Administered Medications:  .  Place/Maintain arterial line, , , Until Discontinued **AND** 0.9 %  sodium chloride infusion, , Intra-arterial, PRN, Azalia Bilis, MD .  0.9 %  sodium chloride infusion, 250 mL, Intravenous, Continuous, Scatliffe, Kristen D, MD .  enoxaparin (LOVENOX) injection 40 mg, 40 mg, Subcutaneous, Q24H, Scatliffe, Kristen D, MD .  EPINEPHrine (ADRENALIN) 4 mg in dextrose 5 % 250 mL (0.016 mg/mL) infusion, 0.5-20 mcg/min, Intravenous, Titrated, Scheidler, Rhona Leavens II, MD, Last Rate: 18.75 mL/hr at 03/21/18 2230, 5 mcg/min at 03/21/18 2230 .  lactated ringers infusion, , Intravenous, Continuous, Scatliffe, Kristen D, MD .  ondansetron (ZOFRAN) injection 4 mg, 4 mg, Intravenous, Q6H PRN, Scatliffe, Kristen D, MD .  pantoprazole (PROTONIX) injection 40 mg, 40 mg, Intravenous, QHS, Scatliffe, Kristen D, MD  Current Outpatient Medications:  .  cloNIDine (CATAPRES) 0.2 MG tablet, Take 1 tablet (0.2 mg total) by mouth 3 (three) times daily as needed (  anxiety)., Disp: 60 tablet, Rfl: 0 .  valACYclovir (VALTREX) 1000 MG tablet, Take 1 tablet (1,000 mg total) by mouth daily. May increase to 2 tablets twice daily for 5 days during an outbreak., Disp: 1 tablet, Rfl: 0 .  albuterol (PROVENTIL HFA;VENTOLIN HFA) 108 (90 Base) MCG/ACT inhaler, Inhale 1 puff into the lungs every 6 (six)  hours as needed for wheezing or shortness of breath. (Patient not taking: Reported on 03/21/2018), Disp: , Rfl:  .  gabapentin (NEURONTIN) 300 MG capsule, Take 2 capsules (600 mg total) by mouth 2 (two) times daily. For agitation, Disp: 120 capsule, Rfl: 0 .  hydrOXYzine (ATARAX/VISTARIL) 25 MG tablet, Take 1 tablet (25 mg total) by mouth 3 (three) times daily as needed for anxiety., Disp: 60 tablet, Rfl: 0 .  ibuprofen (ADVIL,MOTRIN) 600 MG tablet, Take 1 tablet (600 mg total) by mouth every 6 (six) hours as needed for fever, headache or moderate pain. (May buy from over the counter): For pain, fever or headaches., Disp: 30 tablet, Rfl: 0 .  levonorgestrel-ethinyl estradiol (INTROVALE) 0.15-0.03 MG tablet, Take 1 tablet by mouth daily. Birth control method., Disp: 1 tablet, Rfl: 0 .  nicotine polacrilex (NICORETTE) 2 MG gum, Take 1 each (2 mg total) by mouth as needed for smoking cessation. (May buy from over the counter): For smoking cessation, Disp: 100 tablet, Rfl: 0 .  QUEtiapine (SEROQUEL) 300 MG tablet, Take 1 tablet (300 mg total) by mouth at bedtime. For mood control, Disp: 30 tablet, Rfl: 0 .  sertraline (ZOLOFT) 50 MG tablet, Take 1 tablet (50 mg total) by mouth daily. For depression, Disp: 30 tablet, Rfl: 0 .  traZODone (DESYREL) 50 MG tablet, Take 1 tablet (50 mg total) by mouth at bedtime as needed for sleep., Disp: 30 tablet, Rfl: 0  Exam: Current vital signs: BP 119/90   Pulse 67   Resp 10   Ht 5\' 5"  (1.651 m)   SpO2 100%   BMI 19.97 kg/m  Vital signs in last 24 hours: Pulse Rate:  [55-70] 67 (01/17 0000) Resp:  [10-18] 10 (01/17 0000) BP: (108-129)/(79-106) 119/90 (01/17 0000) SpO2:  [91 %-100 %] 100 % (01/17 0000) Arterial Line BP: (119-137)/(78-96) 119/78 (01/17 0000) FiO2 (%):  [40 %] 40 % (01/16 2129) General: Intubated, no sedation HEENT: Normocephalic atraumatic Lungs: Clear to auscultation Cardiovascular: S1-S2 heard regular rate rhythm Extremities: Warm well  perfused Neurological exam Patient's intubated, not on any sedation No spontaneous movement noted Pupils are 3 mm, minimally reactive to light. Gaze is midline with no preference or forced deviation. Corneal reflex present bilaterally On noxious stimulation, patient begins to cough vigorously On noxious stimulation, strong localization with the left upper extremity and left lower extremity and weak localization/withdrawal on the right upper extremity and lower extremity. Coordination cannot be tested. Breathing over the ventilator NIHSS 1a Level of Conscious.: 2 1b LOC Questions: 2 1c LOC Commands: 2 2 Best Gaze: 0 3 Visual: 0 4 Facial Palsy: 0 5a Motor Arm - left: 2 5b Motor Arm - Right: 3 6a Motor Leg - Left: 2 6b Motor Leg - Right: 3 7 Limb Ataxia: 0 8 Sensory: 0 9 Best Language: 3 10 Dysarthria: un 11 Extinct. and Inatten.: 0 TOTAL: 19   Labs I have reviewed labs in epic and the results pertinent to this consultation are: Lactate 2.65, hemoglobin 8.9.  Glucose 204. Urinary toxicology screen positive for THC is and benzos.  CBC    Component Value Date/Time   WBC 4.9  03/21/2018 2106   RBC 2.99 (L) 03/21/2018 2106   HGB 8.9 (L) 03/21/2018 2106   HCT 27.8 (L) 03/21/2018 2106   PLT 172 03/21/2018 2106   MCV 93.0 03/21/2018 2106   MCH 29.8 03/21/2018 2106   MCHC 32.0 03/21/2018 2106   RDW 12.8 03/21/2018 2106   LYMPHSABS 1.4 03/21/2018 2106   MONOABS 0.3 03/21/2018 2106   EOSABS 0.0 03/21/2018 2106   BASOSABS 0.0 03/21/2018 2106    CMP     Component Value Date/Time   NA 140 03/21/2018 2106   K 3.5 03/21/2018 2106   CL 111 03/21/2018 2106   CO2 22 03/21/2018 2106   GLUCOSE 204 (H) 03/21/2018 2106   BUN 6 03/21/2018 2106   CREATININE 1.32 (H) 03/21/2018 2106   CREATININE 0.74 11/16/2015 1113   CALCIUM 9.7 03/21/2018 2106   PROT 3.7 (L) 03/21/2018 2106   ALBUMIN 2.2 (L) 03/21/2018 2106   AST 108 (H) 03/21/2018 2106   ALT 61 (H) 03/21/2018 2106   ALKPHOS  27 (L) 03/21/2018 2106   BILITOT 0.5 03/21/2018 2106   GFRNONAA 58 (L) 03/21/2018 2106   GFRAA >60 03/21/2018 2106    Imaging I have reviewed the images obtained: Noncontrast CT of the head shows no acute changes.  Assessment:  18 year old woman with a significant psychiatric history and multiple suicide attempts in the past, brought in unresponsive after presumed suicide attempt with medication overdose-?  With propranolol and Seroquel, continued to be unresponsive and some concern for seizure. On my examination, she is very strongly localizing on the left and weakly on the right. I do not think at this point she has any evidence of ongoing seizures clinically. Had an exam been any different or more worse, I would have had an urgent EEG performed, but with her waking up, I would get a routine EEG in the morning.   Her current clinical picture is likely consistent with toxic encephalopathy in the setting of medication overdose.   Impression: Medication overdose History of polysubstance abuse History of major depression and bipolar disorder History of anxiety  Recommendations: Supportive care per primary team as you are Try to minimize sedation as much as possible. She does have a propensity to have seizures because of the fact that she had been taking large doses of benzodiazepines and unclear when the last intake of benzodiazepine was. I would obtain a routine EEG in the morning. If it shows any abnormalities and her clinical status deteriorates and her mentation does not improve, I would then consider doing an LTM EEG. Due to the lateralized weakness that appeared on the exam, I would also obtain an MRI of the brain when stable. I discussed the plan in detail with Dr. Carlota Raspberry at patient's bedside in the emergency room. Neurology team will continue to follow with you.   -- Milon Dikes, MD [facility] Pager: [phone] If 7pm to 7am, please call on call as  listed on AMION.   CRITICAL CARE ATTESTATION Performed by: Milon Dikes, MD Total critical care time: 30 minutes Critical care time was exclusive of separately billable procedures and treating other patients and/or supervising APPs/Residents/Students Critical care was necessary to treat or prevent imminent or life-threatening deterioration due to toxic encephalopathy, possible drug overdose This patient is critically ill and at significant risk for neurological worsening and/or death and care requires constant monitoring. Critical care was time spent personally by me on the following activities: development of treatment plan with patient and/or surrogate as well as  nursing, discussions with consultants, evaluation of patient's response to treatment, examination of patient, obtaining history from patient or surrogate, ordering and performing treatments and interventions, ordering and review of laboratory studies, ordering and review of radiographic studies, pulse oximetry, re-evaluation of patient's condition, participation in multidisciplinary rounds and medical decision making of high complexity in the care of this patient.

## 2018-03-22 NOTE — Progress Notes (Addendum)
NAME:  Lindsay Lara, MRN:  [id number], DOB:  11/20/2000, LOS: 1 ADMISSION DATE:  03/21/2018, CONSULTATION DATE:  03/21/2018 REFERRING MD:  Dr. [name] Carl, CHIEF COMPLAINT:  Altered Mental Status   Brief History   18 yr old female w/ PMHx ADHD, Anxiety, Bipolar depression, previous suicide attempts, Polysubstance abuse (cocaine, Marijuana, heroin and ETOH) presents via EMS after Mom found pt unresponsive. Bradycardic to 40s and hypotensive with SBP in the 40s. Intubated. Given Glucagon. Started on epi gtt. PCCM consulted for Intentional OD (propanolol, xanax and Seroquel)  Past Medical History   . Suicidal ideation 05/08/2012  . ADHD (attention deficit hyperactivity disorder) 05/08/2012  . Oppositional defiant disorder 05/08/2012  . Parent-child relational problem 05/08/2012  . Contraception 06/18/2012  . Idiopathic scoliosis 05/05/2015  . MDD (major depressive disorder) 09/17/2015  . Substance induced mood disorder (HCC) 09/18/2015  . Polysubstance abuse (HCC) 09/18/2015  . Chlamydia 09/21/2015  . Contact dermatitis 11/12/2015  . 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 Bipolar affective disorder, current episode hypomanic (HCC) 05/08/2016  . Overdose of benzodiazepine 01/14/2017  . Borderline personality disorder in adult [facility]) 01/14/2017  . Self-inflicted laceration of wrist/arms c/w known h/o "cutting" 01/14/2017  . 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 Severe bipolar I disorder, current or most recent episode depressed (HCC) 01/17/2017  . 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 MDD (major depressive disorder), recurrent severe, without psychosis (HCC) 02/07/2018   Significant Hospital Events   1/16>Admission   Consults:  PCCM Neurology   Procedures:  1/16> Intubated 1/16>CVL placed  1/17> chest tube placed  Significant Diagnostic Tests:  CT head: Unremarkable    EKG: RBBB, Prolonged QTc 528  Micro Data:  1/16 Urine Cx>  Antimicrobials:  None   Interim history/subjective:  Overnight: Agitated and was started on fentanyl gtt   Today, Lindsay Lara was initially awake and alert though she was still agitated.  Objective   Blood pressure (!) 111/95, pulse 71, temperature 98.6 F (37 C), temperature source Oral, resp. rate 18, height 5\' 5"  (1.651 m), weight 54.2 kg, SpO2 98 %.    Vent Mode: PRVC FiO2 (%):  [40 %] 40 % Set Rate:  [18 bmp] 18 bmp Vt Set:  [450 mL] 450 mL PEEP:  [5 cmH20] 5 cmH20 Plateau Pressure:  [17 cmH20-26 cmH20] 17 cmH20   Intake/Output Summary (Last 24 hours) at 03/22/2018 1191 Last data filed at 03/22/2018 0600 Gross per 24 hour  Intake 2582.63 ml  Output 75 ml  Net 2507.63 ml   Filed Weights   03/22/18 0500  Weight: 54.2 kg    Examination: General: Agitated when awake, In NAD HENT: ETT in place, OGT in place Lungs: CTABL though somewhat diminished on the left side. Cardiovascular: RRR, no MGR Abdomen: BS+, Soft, NTTP Extremities: Old scars on left forearm Neuro: Alert, awake, agitated GU: Foley in place  Resolved Hospital Problem list     Assessment & Plan:  18 yr old female w/ PMHx ADHD, Anxiety, Bipolar depression, previous suicide attempts, Polysubstance abuse (cocaine, Marijuana, heroin and ETOH) presents following intentional drug overdose with Propranolol and Seroquel   #Acute toxic encephalopathy: Patient with multiple psychiatric diagnoses and previous attempts of overdose presented with altered mental status and suspected to have overdosed on propranolol, Seroquel and Xanax.  U tox on presentation positive for benzodiazepine and THC.  Status post glucagon and epinephrine gtt. by EMS -Frequent neurochecks -Appreciate neurology recommendation -  For agitation, continue fentanyl as needed. -Appreciate neurology recommendation -Routine EEG once stable, will also probably obtain long-term EEG -Obtain MRI  once stable per neurology  #Acute  respiratory failure secondary to respiratory depression from intentional overdose #Iatrogenic left apical pneumothorax -Continue full mechanical ventilation support -WUA/SBT daily -Continue chest tube support to suction.  #Hypotension and bradycardia secondary to propranolol overdose -Status post glucagon 1 mg IV x2, calcium, magnesium and epinephrine gtt. -Vital signs stable at this moment  #Alcohol use disorder #Marijuana use disorder #Opioid use disorder #Cocaine use disorder -Continue thiamine and folic acid -Nicotine patch in place  #High risk behavior -Known history of genital herpes -Follow-up HIV, hepatitis panel, STI panel -Continue acyclovir 400 mg twice daily.  #ADHD, bipolar disorder depression, anxiety disorder, previous suicidal attempt -Once stable, she will warrant psychiatry evaluation.  Best practice:  Diet: NPO Pain/Anxiety/Delirium protocol (if indicated): holding sedation at this time to assess neurological status VAP protocol (if indicated): yes DVT prophylaxis: Lovenox  GI prophylaxis: PPI Glucose control: if BG exceeds >180mg /dl will start ISS  Mobility: bedrest Code Status: FULL Family Communication: Mom and Dad at bedside pt lives with Dad, Mom checks in on her and communicates frequently.  Disposition: Admit to ICU  Labs   CBC: Recent Labs  Lab 03/21/18 2106  WBC 4.9  NEUTROABS 3.1  HGB 8.9*  HCT 27.8*  MCV 93.0  PLT 172    Basic Metabolic Panel: Recent Labs  Lab 03/21/18 2106  NA 140  K 3.5  CL 111  CO2 22  GLUCOSE 204*  BUN 6  CREATININE 1.32*  CALCIUM 9.7   GFR: Estimated Creatinine Clearance: 58.7 mL/min (A) (by C-G formula based on SCr of 1.32 mg/dL (H)). Recent Labs  Lab 03/21/18 2106 03/21/18 2122  WBC 4.9  --   LATICACIDVEN  --  2.65*    Liver Function Tests: Recent Labs  Lab 03/21/18 2106  AST 108*  ALT 61*  ALKPHOS 27*  BILITOT 0.5  PROT 3.7*  ALBUMIN 2.2*   No  results for input(s): LIPASE, AMYLASE in the last 168 hours. No results for input(s): AMMONIA in the last 168 hours.  ABG    Component Value Date/Time   PHART 7.495 (H) 03/21/2018 2125   PCO2ART 37.6 03/21/2018 2125   PO2ART 407.0 (H) 03/21/2018 2125   HCO3 29.0 (H) 03/21/2018 2125   TCO2 30 03/21/2018 2125   O2SAT 100.0 03/21/2018 2125     Coagulation Profile: No results for input(s): INR, PROTIME in the last 168 hours.  Cardiac Enzymes: Recent Labs  Lab 03/21/18 2106  TROPONINI <0.03    HbA1C: Hgb A1c MFr Bld  Date/Time Value Ref Range Status  02/10/2018 06:55 AM 5.0 4.8 - 5.6 % Final    Comment:    (NOTE) Pre diabetes:          5.7%-6.4% Diabetes:              >6.4% Glycemic control for   <7.0% adults with diabetes   09/17/2015 06:42 PM 5.2 4.8 - 5.6 % Final    Comment:    (NOTE)         Pre-diabetes: 5.7 - 6.4         Diabetes: >6.4         Glycemic control for adults with diabetes: <7.0     CBG: Recent Labs  Lab 03/21/18 2059 03/22/18 0100 03/22/18 0344  GLUCAP 189* 178* 129*     Attending note:  Remains on vent support.  CXR this AM showed PTX on Lt.  BP (!) 126/93   Pulse 65   Temp 98.9 F (37.2 C) (Oral)   Resp 18   Ht 5\' 5"  (1.651 m)   Wt 54.2 kg   SpO2 100%   BMI 19.88 kg/m   Sedated.  HR regular.  Rhonchi on Lt > rt.  Abdomen soft.  No edema.  CXR (reviewed by me) >> lt PTX  A/p:  Intentional OD with xanax, seroquel, beta blocker. - continue supportive measures - monitor hemodynamics  Acute respiratory failure with hypoxia. - full vent support  Lt pneumothorax. - chest tube to -20 suction - f/u CXR  Suicide attempt. - will need psych assessment when more stable  Updated family at bedside  CC time by me independent of resident and procedure time 32 minutes.  Coralyn HellingVineet Aldrin Engelhard, MD [facility]Critical [facility] 03/22/2018, 2:22 PM

## 2018-03-22 NOTE — ED Notes (Signed)
Per Critical Care - No sedation is to be given

## 2018-03-22 NOTE — Procedures (Signed)
History: 18 yo F s/p suicide attempt  Sedation: Fentanyl 75 mcg/h  Technique: This is a 21 channel routine scalp EEG performed at the bedside with bipolar and monopolar montages arranged in accordance to the international 10/20 system of electrode placement. One channel was dedicated to EKG recording.    Background: The background consist primarily of alpha and excessive beta activity.  There is some irregular delta in the right posterior quadrant.  The patient is frequently moving, and there is a possibility that this could be artifactual in nature, but I would favor that there is a focal irregular slow activity in this region.  No epileptiform activity was seen.  Sleep is seen with symmetric appearing structures.  Photic stimulation: Physiologic driving is not performed  EEG Abnormalities: 1) right posterior quadrant slow activity 2) excess beta activity  Clinical Interpretation: This EEG is suggestive of a focal cerebral dysfunction in the right posterior quadrant.  Though there is a possibility that this could be artifactual in nature, I would recommend correlation with imaging studies, preferably MRI.    There was no seizure or evidence of seizure predisposition recorded on this study. Please note that lack of epileptiform activity on EEG does not preclude the possibility of epilepsy.   Ritta Slot, MD [facility] [phone]  If 7pm- 7am, please page neurology on call as listed in AMION.

## 2018-03-22 NOTE — Progress Notes (Signed)
eLink Physician-Brief Progress Note Patient Name: Lindsay Lara DOB: [birth year] MRN: [id number]   Date of Service  03/22/2018  HPI/Events of Note  18 yo with extensive prior psychiatric history involved in ply-substance OD in a suicide bid. She arrived ED comatose, hypotensive, bradycardic and with a prolonged QT interval.  eICU Interventions  EEG, serial neurological assessment, MRI brain, psychiatric evaluation, supportive Rx.        [place]wo U Ogan 03/22/2018, 2:06 AM

## 2018-03-22 NOTE — Progress Notes (Signed)
EEG Completed; Results Pending  

## 2018-03-22 NOTE — Procedures (Signed)
Chest Tube Insertion Procedure Note  Indications:  Clinically significant pneumothorax on the L  Pre-operative Diagnosis: Left sided pneumothorax  Post-operative Diagnosis: Same  Procedure Details  Informed consent was obtained for the procedure, including sedation.  Risks of lung perforation, hemorrhage, arrhythmia, and adverse drug reaction were discussed. Time out was performed. Appropriate location was identified with ultrasound at bedside.   After sterile skin prep, using Seldinger technique, a pigtail percutaneous chest tube was placed in the left anterior 5th rib space.  Findings: None  Estimated Blood Loss:  Minimal         Specimens:  None              Complications:  None; patient tolerated the procedure well.         Disposition: ICU - intubated and hemodynamically stable.         Condition: stable  Joneen Roach, AGACNP-BC [facility] Pulmonary/Critical Care Pager [phone] or [phone]  03/22/2018 9:15 AM

## 2018-03-22 NOTE — Progress Notes (Signed)
Patient transported from ED Trauma A to 2M03 with no complications.  

## 2018-03-22 NOTE — Progress Notes (Signed)
Initial Nutrition Assessment  DOCUMENTATION CODES:   Not applicable  INTERVENTION:  - If patient unable to be extubated, recommend 30 ml Prostat with Osmolite 1.2 @ 20 mL/hr to advance by 10 ml every 4 hours to reach goal rate of 50 ml/hr. - At goal rate, this regimen will provide 1540 kcal, 82 grams of protein, and 984 ml free water. - Slower advancement given possible refeeding risk with inadequate nutrition x1 week PTA.   NUTRITION DIAGNOSIS:   Inadequate oral intake related to inability to eat as evidenced by NPO status.  GOAL:   Patient will meet greater than or equal to 90% of their needs  MONITOR:   Vent status, Weight trends, Labs, I & O's  REASON FOR ASSESSMENT:   Ventilator  ASSESSMENT:   18 year old female with past medical hx of ADHD, anxiety, bipolar depression, previous suicide attempts, polysubstance abuse (cocaine, marijuana, heroin and alcohol abuse). She presented to the ED via EMS after mom found patient unresponsive. Bradycardic and hypotensive. Intubated. Given glucagon. Started on epi gtt. PCCM consulted for intentional OD (propanolol, xanax and Seroquel).  BMI indicates normal weight. Patient intubated with OGT in place. No output in canister; spoke with RN who reports no output this shift. Patient's mom is at bedside and reports all information. She states that patient usually has a very good appetite, eats a varied diet, and does not show any signs of eating disorder/disordered eating. She states that when patient is using drugs she eats very little and makes unhealthy foods choices. Mom reports that patient was on a drug binge the past 1 week and was not eating much if at all during that time.  Spoke with Renae Fickle, CCM NP, who reports hope for extubation today. Will provide TF recommendations above should they be needed.   Current weight is 119 lb. Mom reports patient's UBW is 114 lb and that patient had relayed to her that her "ideal" would be to weigh 120  lb.    Patient is currently intubated on ventilator support MV: 7.9 L/min Temp (24hrs), Avg:98.4 F (36.9 C), Min:97.8 F (36.6 C), Max:98.9 F (37.2 C) Propofol: none BP: 104/59 and MAP: 73  Medications reviewed; 1 mg IV folic acid, 40 mg IV protonix/day, 100 mg IV thiamine/day. Labs reviewed; CBGs: 178 and 129 mg/dl today. IVF; LR @ 75 ml/hr. Drip; fentanyl @ 75 mcg/hr.     NUTRITION - FOCUSED PHYSICAL EXAM:  Completed; no muscle and no fat wasting.   Diet Order:   Diet Order            Diet NPO time specified  Diet effective now              EDUCATION NEEDS:   No education needs have been identified at this time  Skin:  Skin Assessment: Reviewed RN Assessment  Last BM:  PTA/unknown  Height:   Ht Readings from Last 1 Encounters:  03/21/18 5\' 5"  (1.651 m) (61 %, Z= 0.27)*   * Growth percentiles are based on CDC (Girls, 2-20 Years) data.    Weight:   Wt Readings from Last 1 Encounters:  03/22/18 54.2 kg (33 %, Z= -0.45)*   * Growth percentiles are based on CDC (Girls, 2-20 Years) data.    Ideal Body Weight:  56.82 kg  BMI:  Body mass index is 19.88 kg/m.  Estimated Nutritional Needs:   Kcal:  1511 kcal  Protein:  65-81 grams  Fluid:  >/= 1.5 L/day  Jarome Matin, MS, RD, LDN, [facility] Inpatient Clinical Dietitian Pager # [phone] After hours/weekend pager # [phone]

## 2018-03-23 ENCOUNTER — Inpatient Hospital Stay (HOSPITAL_COMMUNITY): Payer: PRIVATE HEALTH INSURANCE

## 2018-03-23 ENCOUNTER — Encounter (HOSPITAL_COMMUNITY): Payer: Self-pay

## 2018-03-23 DIAGNOSIS — R0902 Hypoxemia: Secondary | ICD-10-CM

## 2018-03-23 DIAGNOSIS — J9809 Other diseases of bronchus, not elsewhere classified: Secondary | ICD-10-CM

## 2018-03-23 DIAGNOSIS — T17500A Unspecified foreign body in bronchus causing asphyxiation, initial encounter: Secondary | ICD-10-CM

## 2018-03-23 LAB — CBC WITH DIFFERENTIAL/PLATELET
Abs Immature Granulocytes: 0.1 10*3/uL — ABNORMAL HIGH (ref 0.00–0.07)
Basophils Absolute: 0 10*3/uL (ref 0.0–0.1)
Basophils Relative: 0 %
Eosinophils Absolute: 0.1 10*3/uL (ref 0.0–0.5)
Eosinophils Relative: 0 %
HEMATOCRIT: 31.2 % — AB (ref 36.0–46.0)
Hemoglobin: 9.9 g/dL — ABNORMAL LOW (ref 12.0–15.0)
Immature Granulocytes: 1 %
Lymphocytes Relative: 17 %
Lymphs Abs: 2.3 10*3/uL (ref 0.7–4.0)
MCH: 29.8 pg (ref 26.0–34.0)
MCHC: 31.7 g/dL (ref 30.0–36.0)
MCV: 94 fL (ref 80.0–100.0)
Monocytes Absolute: 0.8 10*3/uL (ref 0.1–1.0)
Monocytes Relative: 5 %
Neutro Abs: 10.7 10*3/uL — ABNORMAL HIGH (ref 1.7–7.7)
Neutrophils Relative %: 77 %
Platelets: 132 10*3/uL — ABNORMAL LOW (ref 150–400)
RBC: 3.32 MIL/uL — ABNORMAL LOW (ref 3.87–5.11)
RDW: 12.7 % (ref 11.5–15.5)
WBC: 13.9 10*3/uL — ABNORMAL HIGH (ref 4.0–10.5)
nRBC: 0 % (ref 0.0–0.2)

## 2018-03-23 LAB — POCT I-STAT 3, ART BLOOD GAS (G3+)
Bicarbonate: 24.9 mmol/L (ref 20.0–28.0)
O2 Saturation: 97 %
Patient temperature: 98.6
TCO2: 26 mmol/L (ref 22–32)
pCO2 arterial: 40.7 mmHg (ref 32.0–48.0)
pH, Arterial: 7.395 (ref 7.350–7.450)
pO2, Arterial: 93 mmHg (ref 83.0–108.0)

## 2018-03-23 LAB — COMPREHENSIVE METABOLIC PANEL
ALT: 370 U/L — ABNORMAL HIGH (ref 0–44)
AST: 361 U/L — ABNORMAL HIGH (ref 15–41)
Albumin: 2.4 g/dL — ABNORMAL LOW (ref 3.5–5.0)
Alkaline Phosphatase: 61 U/L (ref 38–126)
Anion gap: 9 (ref 5–15)
BILIRUBIN TOTAL: 1.1 mg/dL (ref 0.3–1.2)
BUN: 13 mg/dL (ref 6–20)
CO2: 22 mmol/L (ref 22–32)
Calcium: 7.7 mg/dL — ABNORMAL LOW (ref 8.9–10.3)
Chloride: 109 mmol/L (ref 98–111)
Creatinine, Ser: 1.23 mg/dL — ABNORMAL HIGH (ref 0.44–1.00)
GFR calc Af Amer: >60 mL/min (ref >60)
GFR calc non Af Amer: >60 mL/min (ref >60)
Glucose, Bld: 71 mg/dL (ref 70–99)
Potassium: 3.6 mmol/L (ref 3.5–5.1)
Sodium: 140 mmol/L (ref 135–145)
TOTAL PROTEIN: 4.3 g/dL — AB (ref 6.5–8.1)

## 2018-03-23 LAB — CBC
HCT: 32.3 % — ABNORMAL LOW (ref 36.0–46.0)
HEMOGLOBIN: 10.5 g/dL — AB (ref 12.0–15.0)
MCH: 30.1 pg (ref 26.0–34.0)
MCHC: 32.5 g/dL (ref 30.0–36.0)
MCV: 92.6 fL (ref 80.0–100.0)
Platelets: 141 10*3/uL — ABNORMAL LOW (ref 150–400)
RBC: 3.49 MIL/uL — ABNORMAL LOW (ref 3.87–5.11)
RDW: 13.1 % (ref 11.5–15.5)
WBC: 11.2 10*3/uL — ABNORMAL HIGH (ref 4.0–10.5)
nRBC: 0 % (ref 0.0–0.2)

## 2018-03-23 LAB — LACTIC ACID, PLASMA: Lactic Acid, Venous: 1.2 mmol/L (ref 0.5–1.9)

## 2018-03-23 LAB — URINE CULTURE: Culture: NO GROWTH

## 2018-03-23 LAB — PHOSPHORUS: Phosphorus: 2.4 mg/dL — ABNORMAL LOW (ref 2.5–4.6)

## 2018-03-23 LAB — HEPATITIS B CORE ANTIBODY, IGM: Hep B C IgM: NEGATIVE

## 2018-03-23 LAB — MAGNESIUM: Magnesium: 1.4 mg/dL — ABNORMAL LOW (ref 1.7–2.4)

## 2018-03-23 LAB — HEPATITIS C ANTIBODY

## 2018-03-23 MED ORDER — LIDOCAINE HCL (PF) 1 % IJ SOLN
INTRAMUSCULAR | Status: AC
  Filled 2018-03-23: qty 5

## 2018-03-23 MED ORDER — MIDAZOLAM HCL 2 MG/2ML IJ SOLN
1.0000 mg | INTRAMUSCULAR | Status: DC | PRN
  Administered 2018-03-23 – 2018-03-28 (×22): 2 mg via INTRAVENOUS
  Filled 2018-03-23 (×22): qty 2

## 2018-03-23 MED ORDER — PIPERACILLIN-TAZOBACTAM 3.375 G IVPB
3.3750 g | Freq: Three times a day (TID) | INTRAVENOUS | Status: DC
  Administered 2018-03-24 (×2): 3.375 g via INTRAVENOUS
  Filled 2018-03-23 (×3): qty 50

## 2018-03-23 MED ORDER — MIDAZOLAM HCL 2 MG/2ML IJ SOLN
2.0000 mg | Freq: Once | INTRAMUSCULAR | Status: AC
  Administered 2018-03-23: 2 mg via INTRAVENOUS

## 2018-03-23 MED ORDER — LIDOCAINE HCL (CARDIAC) PF 100 MG/5ML IV SOSY
PREFILLED_SYRINGE | INTRAVENOUS | Status: AC
  Filled 2018-03-23: qty 5

## 2018-03-23 MED ORDER — FENTANYL CITRATE (PF) 100 MCG/2ML IJ SOLN
25.0000 ug | INTRAMUSCULAR | Status: DC | PRN
  Administered 2018-03-23 – 2018-03-27 (×10): 50 ug via INTRAVENOUS
  Filled 2018-03-23 (×2): qty 2

## 2018-03-23 MED ORDER — SODIUM CHLORIDE 0.9 % IV BOLUS
500.0000 mL | Freq: Once | INTRAVENOUS | Status: AC
  Administered 2018-03-23: 500 mL via INTRAVENOUS

## 2018-03-23 MED ORDER — ALBUTEROL SULFATE (2.5 MG/3ML) 0.083% IN NEBU
2.5000 mg | INHALATION_SOLUTION | RESPIRATORY_TRACT | Status: DC | PRN
  Administered 2018-03-31: 2.5 mg via RESPIRATORY_TRACT
  Filled 2018-03-23: qty 3

## 2018-03-23 MED ORDER — VANCOMYCIN HCL IN DEXTROSE 1-5 GM/200ML-% IV SOLN
1000.0000 mg | INTRAVENOUS | Status: DC
  Administered 2018-03-24: 1000 mg via INTRAVENOUS
  Filled 2018-03-23: qty 200

## 2018-03-23 MED ORDER — DEXMEDETOMIDINE HCL IN NACL 400 MCG/100ML IV SOLN
0.0000 ug/kg/h | INTRAVENOUS | Status: AC
  Administered 2018-03-23 (×2): 0.4 ug/kg/h via INTRAVENOUS
  Administered 2018-03-24 (×2): 1.2 ug/kg/h via INTRAVENOUS
  Administered 2018-03-24 – 2018-03-25 (×2): 0.7 ug/kg/h via INTRAVENOUS
  Administered 2018-03-25: 1.2 ug/kg/h via INTRAVENOUS
  Administered 2018-03-25: 1 ug/kg/h via INTRAVENOUS
  Administered 2018-03-25: 1.2 ug/kg/h via INTRAVENOUS
  Filled 2018-03-23 (×9): qty 100

## 2018-03-23 MED ORDER — MIDAZOLAM HCL 2 MG/2ML IJ SOLN
INTRAMUSCULAR | Status: AC
  Filled 2018-03-23: qty 2

## 2018-03-23 MED ORDER — SODIUM CHLORIDE 3 % IN NEBU
4.0000 mL | INHALATION_SOLUTION | Freq: Two times a day (BID) | RESPIRATORY_TRACT | Status: AC
  Administered 2018-03-23 – 2018-03-25 (×5): 4 mL via RESPIRATORY_TRACT
  Filled 2018-03-23 (×7): qty 4

## 2018-03-23 NOTE — Progress Notes (Signed)
eLink Physician-Brief Progress Note Patient Name: ALEECE ZACARIAS DOB: 2000/04/28 MRN: [id number]   Date of Service  03/23/2018  HPI/Events of Note  Temp to 103.  HD stable on no pressors but most recent WBC elevated to 11.  LFT elevated.  On acyclovir for known h/o of herpes  eICU Interventions  Plan: Blood and urine cultures Check lactate/PCT Cooling blanket hold on tylenol due to elevated LFTs Stat PCXR Low threshold to initiate broad coverage ABX is data supports move.     Intervention Category Major Interventions: Other:  DETERDING,ELIZABETH 03/23/2018, 8:59 PM

## 2018-03-23 NOTE — Progress Notes (Signed)
eLink Physician-Brief Progress Note Patient Name: Lindsay Lara DOB: 2000-07-09 MRN: [id number]   Date of Service  03/23/2018  HPI/Events of Note  Issues with severe agitation while intubated.  Not following commands also with large volume secretions and coarse breath sounds.  Orders for restraints given and increases in prn fentanyl along with prn anxiolytic  eICU Interventions  Plan: In addition to above have added precedex IV for ongoing delirium.     Intervention Category Major Interventions: Delirium, psychosis, severe agitation - evaluation and management  Dshaun Reppucci 03/23/2018, 12:19 AM

## 2018-03-23 NOTE — Progress Notes (Signed)
CPT not done at this time per RN at bedside.  Patient will not tolerate. Patient agitated and anxious. RT will continue to monitor.

## 2018-03-23 NOTE — Progress Notes (Signed)
Pt attempting to self-extubate multiple times this shift despite sedation, titrated in accordance with optimum vital signs, and soft wrist restraints.  Handoff communication will be with suicide sitter in attendance this am per [facility].  Will continue to monitor.

## 2018-03-23 NOTE — Plan of Care (Signed)
PCCM Plan of Care  Evaluated patient at bedside for desaturation with increasing oxygen requirements and agitation  She is a 18 YO female with history significant for ADHD, Anxiety, Bipolar disorder, polysubstance abuse and prior suicidal attempts who was admitted to the MICU on 1/16 after intentional overdose. She is intubated. Her hospital course has been complicated with left pneumothorax for which she underwent a left sided pigtail.  She has had episodes of desaturation tonight necessitating bagging and her FIO2 has been increased from 40% to 100%. Precedex has also been initiated for agitation.  CXR has been obtained and shows Left lung collapse with leftward mediastinal shift consistent with mucus plugging per my read. Pigtail remains in similar position as prior CXR  Plan Discussed with patient's mother at bedside that we would need to perform a bedside bronchoscopy for suctioning to relieve the mucus.Discussed the procedure, risks and benefits and she provided consent.  Ambuscope used for bronchoscopy. Copious amounts of greenish yellowish secretions found and suctioned out.  After procedure CXR obtained which shows reexpansion of left lung.  FIO2 and PEEP weaned down to 40% and 5 respectively  -Aggressive pulmonary toilet including chest physiotherapy etc -Hypertonic saline nebs BID for 3 days -Obtain tracheal aspirate for culture -maintain chest tube to suction  The patient is critically ill with multiple organ systems failure and requires high complexity decision making for assessment and support, frequent evaluation and titration of therapies, application of advanced monitoring technologies and extensive interpretation of multiple databases.   Critical Care Time devoted to patient care services described in this note is  30 Minutes. This time reflects time of care of this signee Dr. Larna Daughters. This critical care time does not reflect procedure time, or teaching time or  supervisory time of PA/NP/Med student/Med Resident etc but could involve care discussion time.  Larna Daughters, M.D. [facility] Pulmonary/Critical Care Medicine.  After hours pager: [phone].

## 2018-03-23 NOTE — Progress Notes (Signed)
NAME:  Lindsay Lara, MRN:  [id number], DOB:  07-Apr-2000, LOS: 2 ADMISSION DATE:  03/21/2018, CONSULTATION DATE:  03/21/2018 REFERRING MD:  Dr. [name] Carl, CHIEF COMPLAINT:  Altered Mental Status   Brief History   18 yr old female w/ PMHx ADHD, Anxiety, Bipolar depression, previous suicide attempts, Polysubstance abuse (cocaine, Marijuana, heroin and ETOH) presents via EMS after Mom found pt unresponsive. Bradycardic to 40s and hypotensive with SBP in the 40s. Intubated. Given Glucagon. Started on epi gtt. PCCM consulted for Intentional OD (propanolol, xanax and Seroquel)  Past Medical History   . Suicidal ideation 05/08/2012  . ADHD (attention deficit hyperactivity disorder) 05/08/2012  . Oppositional defiant disorder 05/08/2012  . Parent-child relational problem 05/08/2012  . Contraception 06/18/2012  . Idiopathic scoliosis 05/05/2015  . MDD (major depressive disorder) 09/17/2015  . Substance induced mood disorder (HCC) 09/18/2015  . Polysubstance abuse (HCC) 09/18/2015  . Chlamydia 09/21/2015  . Contact dermatitis 11/12/2015  . 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 Bipolar affective disorder, current episode hypomanic (HCC) 05/08/2016  . Overdose of benzodiazepine 01/14/2017  . Borderline personality disorder in adult [facility]) 01/14/2017  . Self-inflicted laceration of wrist/arms c/w known h/o "cutting" 01/14/2017  . 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 Severe bipolar I disorder, current or most recent episode depressed (HCC) 01/17/2017  . 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 MDD (major depressive disorder), recurrent severe, without psychosis (HCC) 02/07/2018   Significant Hospital Events   1/16>Admission  1/16>Seen by Neurology - recommend MRI for possible lateralizing signs  Consults:  PCCM Neurology   Procedures:  1/16> Intubated 1/16>CVL placed  1/17> chest  tube placed  Significant Diagnostic Tests:  CT head: Unremarkable  EKG: RBBB, Prolonged QTc 528 EEG: possible right focal cerebral dysfunction. No clear seizure activity.  Micro Data:  1/16 Urine Cx>  Antimicrobials:  None   Interim history/subjective:  Required bronchoscopy overnight for mucus plugging. Patient periodically agitated.  Objective   Blood pressure 112/66, pulse 83, temperature 98.6 F (37 C), temperature source Axillary, resp. rate 18, height 5\' 5"  (1.651 m), weight 54.2 kg, SpO2 99 %. CVP:  [15 mmHg-16 mmHg] 15 mmHg  Vent Mode: PRVC FiO2 (%):  [40 %-100 %] 100 % Set Rate:  [18 bmp] 18 bmp Vt Set:  [450 mL] 450 mL PEEP:  [5 cmH20-10 cmH20] 5 cmH20 Pressure Support:  [12 cmH20] 12 cmH20 Plateau Pressure:  [23 cmH20-28 cmH20] 26 cmH20   Intake/Output Summary (Last 24 hours) at 03/23/2018 0803 Last data filed at 03/23/2018 0600 Gross per 24 hour  Intake 1882.35 ml  Output 463 ml  Net 1419.35 ml   Filed Weights   03/22/18 0500  Weight: 54.2 kg    Examination: General: Agitated when awake, In NAD HENT: ETT in place, OGT in place Lungs: CTA, equal on both sides. Cardiovascular: RRR, no MGR Abdomen: BS+, Soft, NTTP Extremities: Old scars on left forearm Neuro: Alert, awake, agitated, follows commands with symmetric strength. GU: Foley in place  Resolved Hospital Problem list     Assessment & Plan:  Critically ill due to respiratory failure requiring mechanical ventilation.  Respiratory failure secondary to altered mental status due to overdose of Propranolol and Seroquel on background of Polysubstance abuse (cocaine, Marijuana, heroin and ETOH) presents following. Critically ill due to periodic agitation requiring titration of dexmedetomidine and fentanyl to maintain comfort and prevent self-harm. Possible focal deficits appear to have resolved. Pneumothorax  iatrogenic Hypotension and bradycardia from B-blocker overdose have resolved. History of ADHD,  Anxiety, Bipolar depression, previous suicide attempts Prior high risk sexual behavior  Plan:  MRI today to rule out structural brain disease. Maintain sedation until after MRI Wean sedation and extubate after brief SBT once MRI complete  Chest tube to water seal once extubated. Psychiatric consultation once extubated. Needs assessment for ongoing suicidality.   Best practice:  Diet: NPO Pain/Anxiety/Delirium protocol (if indicated): sedation until after MRI VAP protocol (if indicated): yes DVT prophylaxis: Lovenox  GI prophylaxis: PPI Glucose control: if BG exceeds >180mg /dl will start ISS  Mobility: bedrest Code Status: FULL Family Communication: mother updated at bedside Disposition: ICU with potential extubation today.  Labs   CBC: Recent Labs  Lab 03/21/18 2106 03/23/18 0626  WBC 4.9 11.2*  NEUTROABS 3.1  --   HGB 8.9* 10.5*  HCT 27.8* 32.3*  MCV 93.0 92.6  PLT 172 141*    Basic Metabolic Panel: Recent Labs  Lab 03/21/18 2106  NA 140  K 3.5  CL 111  CO2 22  GLUCOSE 204*  BUN 6  CREATININE 1.32*  CALCIUM 9.7   GFR: Estimated Creatinine Clearance: 58.7 mL/min (A) (by C-G formula based on SCr of 1.32 mg/dL (H)). Recent Labs  Lab 03/21/18 2106 03/21/18 2122 03/22/18 0959 03/23/18 0626  WBC 4.9  --   --  11.2*  LATICACIDVEN  --  2.65* 1.4  --     Liver Function Tests: Recent Labs  Lab 03/21/18 2106  AST 108*  ALT 61*  ALKPHOS 27*  BILITOT 0.5  PROT 3.7*  ALBUMIN 2.2*   No results for input(s): LIPASE, AMYLASE in the last 168 hours. No results for input(s): AMMONIA in the last 168 hours.  ABG    Component Value Date/Time   PHART 7.395 03/23/2018 0213   PCO2ART 40.7 03/23/2018 0213   PO2ART 93.0 03/23/2018 0213   HCO3 24.9 03/23/2018 0213   TCO2 26 03/23/2018 0213   O2SAT 97.0 03/23/2018 0213     Coagulation Profile: No results for input(s): INR, PROTIME in the last 168 hours.  Cardiac Enzymes: Recent Labs  Lab 03/21/18 2106    TROPONINI <0.03    HbA1C: Hgb A1c MFr Bld  Date/Time Value Ref Range Status  02/10/2018 06:55 AM 5.0 4.8 - 5.6 % Final    Comment:    (NOTE) Pre diabetes:          5.7%-6.4% Diabetes:              >6.4% Glycemic control for   <7.0% adults with diabetes   09/17/2015 06:42 PM 5.2 4.8 - 5.6 % Final    Comment:    (NOTE)         Pre-diabetes: 5.7 - 6.4         Diabetes: >6.4         Glycemic control for adults with diabetes: <7.0     CBG: Recent Labs  Lab 03/21/18 2059 03/22/18 0100 03/22/18 0344  GLUCAP 189* 178* 129*   CRITICAL CARE Performed by: Lynnell Catalanavi Lakiyah Arntson   Total critical care time: 40 minutes  Critical care time was exclusive of separately billable procedures and treating other patients.  Critical care was necessary to treat or prevent imminent or life-threatening deterioration.  Critical care was time spent personally by me on the following activities: development of treatment plan with patient and/or surrogate as well as nursing, discussions with consultants, evaluation of patient's response to treatment, examination of patient, obtaining  history from patient or surrogate, ordering and performing treatments and interventions, ordering and review of laboratory studies, ordering and review of radiographic studies, pulse oximetry, re-evaluation of patient's condition and participation in multidisciplinary rounds.  Lynnell Catalanavi Latrisa Hellums, MD [facility] ICU Physician [facility]  Critical Care  Pager: [phone] Mobile: [phone] After hours: [phone] 03/23/2018, 8:03 AM

## 2018-03-23 NOTE — Progress Notes (Signed)
eLink Physician-Brief Progress Note Patient Name: GAILYN MONZO DOB: 08-28-2000 MRN: [id number]   Date of Service  03/23/2018  HPI/Events of Note  Patient with new fever to 103 in the setting of new infiltrate in right upper lobe and elevated WBC.  Lactate pending.  BP soft but in part due to sedation while on vent.  eICU Interventions  Plan: Pharmacy consult for broad spec ABX for HCAP F/U on lactate and UA 500 cc NS bolus for BP support     Intervention Category Major Interventions: Sepsis - evaluation and management  Korra Christine 03/23/2018, 10:25 PM

## 2018-03-23 NOTE — Progress Notes (Signed)
CRITICAL VALUE ALERT  Critical Value: worsening lt pneumothorax, lt hemothorax, lt pleural effusion    Date & Time Notied:  03/23/18 0206  Provider Notified: Dr. Darrick Pennaeterding  Orders Received/Actions taken: bedside assessment per CCM per Dr. Darrick Pennaeterding

## 2018-03-23 NOTE — Progress Notes (Signed)
EEG report interpretation:   This EEG is suggestive of a focal cerebral dysfunction in the right posterior quadrant. Though there is a possibility that this could be artifactual in nature, I would recommend correlation with imaging studies, preferably MRI. There was no seizure or evidence of seizure predisposition recorded on this study.  MRI brain: Question diffuse cortical edema right hemisphere on axial T2 images. While this could be due to artifact, consider seizure related cortical edema.  A/R: Acute encephalolpathy in the context of suicide attempt 1. Images from her MRI were reviewed. Although the increased signal in the right hemisphere on the T2-weighted images appears more likely to be artifactual than due to cortical edema (as there is no corresponding abnormal signal on DWI), it could represent a transient seizure-related phenomenon.  2. The EEG also correlated with the above finding, with focal cerebral dysfunction in the right posterior quadrant.  3. Of note, her mother reported some sort of shaking when the patient was found unresponsive at home. Given report of "using a lot of Xanax" over the past few weeks, benzodiazepine withdrawal seizure is relatively high on the DDx 4. Would start the patient on a scheduled rather than PRN benzodiazepine for possible benzo withdrawal: Ativan 2 mg IV q6h x 1 day, then Ativan 1 mg IV q6h x 2 days, then switch to PRN.   Electronically signed: Dr. Caryl Pina

## 2018-03-23 NOTE — Progress Notes (Signed)
CPT held at this time as the patient is thrashing in the bed and trying to pull at lines at tubes. The patient's RN and sitter are at the bedside at this time.

## 2018-03-23 NOTE — Procedures (Signed)
Bronchoscopy Procedure Note TANEYAH ABDELAZIZ [id number] 05/18/2000  Procedure: Bronchoscopy Indications: Diagnostic evaluation of the airways  Procedure Details Consent: Risks of procedure as well as the alternatives and risks of each were explained to the (patient/caregiver).  Consent for procedure obtained.   Time Out: Verified patient identification, verified procedure, site/side was marked, verified correct patient position, special equipment/implants available, medications/allergies/relevent history reviewed, required imaging and test results available.  Performed  In preparation for procedure, patient was given 100% FiO2 and bronchoscope lubricated. Sedation: Benzodiazepines (midazolam 2mg  and fentanyl )  Airway entered and the following bronchi were examined: RUL, RML, RLL, LUL, LLL and Bronchi.  Copious amounts of greenish yellowish secretions suctioned out of the Left airways as well as the trachea. The airways were patent with no endobronchial lesions seen Procedures performed: Therapeutic suctioning Bronchoscope removed.  FIO2 weaned down to 40%  Evaluation Hemodynamic Status: BP stable throughout; O2 sats: stable throughout Patient's Current Condition: stable Specimens:  None Complications: No apparent complications Patient did tolerate procedure well.   Larna Daughters BIYAKI 03/23/2018

## 2018-03-23 NOTE — Progress Notes (Signed)
Pharmacy Antibiotic Note  Lindsay Lara is a 18 y.o. female admitted on 03/21/2018 with overdose.  Pharmacy has been consulted for Vancomycin/Zosyn dosing. Now with fever and infiltrate on CXR. WBC increasing. Mild bump in Scr.   Plan: Vancomycin 1000 mg IV q24h >>Esitmated AUC: 456 Zosyn 3.375G IV q8h to be infused over 4 hours Trend WBC, temp, renal function  F/U infectious work-up Drug levels as indicated  Height: 5\' 5"  (165.1 cm) Weight: 119 lb 7.8 oz (54.2 kg) IBW/kg (Calculated) : 57  Temp (24hrs), Avg:100 F (37.8 C), Min:97.5 F (36.4 C), Max:103 F (39.4 C)  Recent Labs  Lab 03/21/18 2106 03/21/18 2122 03/22/18 0959 03/23/18 0626 03/23/18 2140 03/23/18 2149  WBC 4.9  --   --  11.2* 13.9*  --   CREATININE 1.32*  --   --  1.23*  --   --   LATICACIDVEN  --  2.65* 1.4  --   --  1.2    Estimated Creatinine Clearance: 62.9 mL/min (A) (by C-G formula based on SCr of 1.23 mg/dL (H)).    Allergies  Allergen Reactions  . Latex Other (See Comments)    Irritation    Abran Duke 03/23/2018 11:31 PM

## 2018-03-24 ENCOUNTER — Inpatient Hospital Stay (HOSPITAL_COMMUNITY): Payer: PRIVATE HEALTH INSURANCE

## 2018-03-24 DIAGNOSIS — R609 Edema, unspecified: Secondary | ICD-10-CM

## 2018-03-24 LAB — CBC
HCT: 30.7 % — ABNORMAL LOW (ref 36.0–46.0)
HEMOGLOBIN: 9.8 g/dL — AB (ref 12.0–15.0)
MCH: 29.7 pg (ref 26.0–34.0)
MCHC: 31.9 g/dL (ref 30.0–36.0)
MCV: 93 fL (ref 80.0–100.0)
Platelets: 126 10*3/uL — ABNORMAL LOW (ref 150–400)
RBC: 3.3 MIL/uL — ABNORMAL LOW (ref 3.87–5.11)
RDW: 12.6 % (ref 11.5–15.5)
WBC: 13.2 10*3/uL — ABNORMAL HIGH (ref 4.0–10.5)
nRBC: 0 % (ref 0.0–0.2)

## 2018-03-24 LAB — BASIC METABOLIC PANEL
Anion gap: 9 (ref 5–15)
BUN: 9 mg/dL (ref 6–20)
CO2: 22 mmol/L (ref 22–32)
Calcium: 7.5 mg/dL — ABNORMAL LOW (ref 8.9–10.3)
Chloride: 104 mmol/L (ref 98–111)
Creatinine, Ser: 1.05 mg/dL — ABNORMAL HIGH (ref 0.44–1.00)
GFR calc Af Amer: >60 mL/min (ref >60)
GFR calc non Af Amer: >60 mL/min (ref >60)
Glucose, Bld: 76 mg/dL (ref 70–99)
POTASSIUM: 3.9 mmol/L (ref 3.5–5.1)
Sodium: 135 mmol/L (ref 135–145)

## 2018-03-24 LAB — MAGNESIUM
Magnesium: 1.7 mg/dL (ref 1.7–2.4)
Magnesium: 1.6 mg/dL — ABNORMAL LOW (ref 1.7–2.4)

## 2018-03-24 LAB — GLUCOSE, CAPILLARY
GLUCOSE-CAPILLARY: 89 mg/dL (ref 70–99)
Glucose-Capillary: 74 mg/dL (ref 70–99)

## 2018-03-24 LAB — PHOSPHORUS
Phosphorus: 2.7 mg/dL (ref 2.5–4.6)
Phosphorus: 2.8 mg/dL (ref 2.5–4.6)

## 2018-03-24 LAB — LACTIC ACID, PLASMA: Lactic Acid, Venous: 1.2 mmol/L (ref 0.5–1.9)

## 2018-03-24 MED ORDER — MIDAZOLAM HCL 2 MG/2ML IJ SOLN
INTRAMUSCULAR | Status: AC
  Filled 2018-03-24: qty 4

## 2018-03-24 MED ORDER — NOREPINEPHRINE-SODIUM CHLORIDE 4-0.9 MG/250ML-% IV SOLN
0.0000 ug/min | INTRAVENOUS | Status: DC
  Administered 2018-03-24: 2 ug/min via INTRAVENOUS
  Filled 2018-03-24: qty 250

## 2018-03-24 MED ORDER — VITAL HIGH PROTEIN PO LIQD
1000.0000 mL | ORAL | Status: DC
  Administered 2018-03-24 – 2018-03-25 (×4): 1000 mL

## 2018-03-24 MED ORDER — MAGNESIUM SULFATE IN D5W 1-5 GM/100ML-% IV SOLN
1.0000 g | Freq: Once | INTRAVENOUS | Status: AC
  Administered 2018-03-24: 1 g via INTRAVENOUS
  Filled 2018-03-24: qty 100

## 2018-03-24 MED ORDER — QUETIAPINE FUMARATE 300 MG PO TABS
300.0000 mg | ORAL_TABLET | Freq: Every day | ORAL | Status: DC
  Administered 2018-03-24 – 2018-04-01 (×9): 300 mg
  Filled 2018-03-24 (×9): qty 1

## 2018-03-24 MED ORDER — SODIUM CHLORIDE 0.9 % IV SOLN
3.0000 g | Freq: Four times a day (QID) | INTRAVENOUS | Status: DC
  Administered 2018-03-24 – 2018-03-26 (×8): 3 g via INTRAVENOUS
  Filled 2018-03-24 (×9): qty 3

## 2018-03-24 MED ORDER — SENNA 8.6 MG PO TABS
1.0000 | ORAL_TABLET | Freq: Every day | ORAL | Status: DC | PRN
  Administered 2018-03-24 – 2018-03-25 (×2): 8.6 mg
  Filled 2018-03-24 (×3): qty 1

## 2018-03-24 MED ORDER — PRO-STAT SUGAR FREE PO LIQD
30.0000 mL | Freq: Two times a day (BID) | ORAL | Status: DC
  Administered 2018-03-24 – 2018-03-25 (×3): 30 mL
  Filled 2018-03-24 (×3): qty 30

## 2018-03-24 MED ORDER — MIDAZOLAM HCL 2 MG/2ML IJ SOLN
4.0000 mg | Freq: Once | INTRAMUSCULAR | Status: AC
  Administered 2018-03-24: 4 mg via INTRAVENOUS

## 2018-03-24 MED ORDER — SODIUM PHOSPHATES 45 MMOLE/15ML IV SOLN
20.0000 mmol | Freq: Once | INTRAVENOUS | Status: AC
  Administered 2018-03-24: 20 mmol via INTRAVENOUS
  Filled 2018-03-24: qty 6.67

## 2018-03-24 MED ORDER — GLYCERIN (LAXATIVE) 2.1 G RE SUPP
1.0000 | Freq: Every day | RECTAL | Status: DC | PRN
  Filled 2018-03-24: qty 1

## 2018-03-24 MED ORDER — LORAZEPAM 0.5 MG PO TABS
0.5000 mg | ORAL_TABLET | Freq: Two times a day (BID) | ORAL | Status: DC
  Administered 2018-03-24 – 2018-03-28 (×9): 1 mg
  Filled 2018-03-24 (×9): qty 2

## 2018-03-24 NOTE — Progress Notes (Signed)
Patient asleep and calm CPT not given at this time RN aware.

## 2018-03-24 NOTE — Progress Notes (Signed)
Patient febrile to 101F due to agitation waited 30 minutes to recheck. Febrile to 103F. Elink notified. See new orders.   Cooling blanket applied, labs completed, xray completed and antibiotics started.   Due to agitation, plan for RASS -1. Will titrate sedation accordingly and notified Elink if blood pressure is unable to sustain normotensive.  Will continue to closely monitor.

## 2018-03-24 NOTE — Progress Notes (Signed)
Pharmacy Antibiotic Note  Lindsay Lara is a 18 y.o. female admitted on 03/21/2018 with pneumonia. Patient has been on vancomycin and Zosyn. Zosyn has been discontinued and pharmacy has now been consulted for Unasyn dosing. WBC stable 13.2, temp to 103 overnight and currently AF. Scr trending down, now 1.05 and estimated CrCl ~74 mL/min.  Plan: Unasyn 3g IV q6h Continue vancomycin 1000mg  IV q24h F/u clinical status, C&S, renal function, de-escalation, LOT, vancomycin levels as appropriate  Height: 5\' 5"  (165.1 cm) Weight: 119 lb 7.8 oz (54.2 kg) IBW/kg (Calculated) : 57  Temp (24hrs), Avg:100.1 F (37.8 C), Min:97.5 F (36.4 C), Max:103 F (39.4 C)  Recent Labs  Lab 03/21/18 2106 03/21/18 2122 03/22/18 0959 03/23/18 0626 03/23/18 2140 03/23/18 2149 03/23/18 2353 03/24/18 0650  WBC 4.9  --   --  11.2* 13.9*  --   --  13.2*  CREATININE 1.32*  --   --  1.23*  --   --   --  1.05*  LATICACIDVEN  --  2.65* 1.4  --   --  1.2 1.2  --     Estimated Creatinine Clearance: 73.7 mL/min (A) (by C-G formula based on SCr of 1.05 mg/dL (H)).    Allergies  Allergen Reactions  . Latex Other (See Comments)    Irritation    Antimicrobials this admission: Vancomycin 1/18 >>  Zosyn 1/18 >> 1/19 Unasyn 1/19 >>  Dose adjustments this admission: None  Microbiology results: 1/16 UCx: NG final 1/17 MRSA PCR: negative 1/18 BCx: collected 1/18 UCx: collected 1/18 TA Cx: pending  Thank you for allowing pharmacy to be a part of this patient's care.  Roderic Scarce Zigmund Daniel, PharmD, BCPS PGY2 Infectious Diseases Pharmacy Resident Phone: [phone] 03/24/2018 10:03 AM

## 2018-03-24 NOTE — Progress Notes (Signed)
CPT held at this time. The patient is combative.

## 2018-03-24 NOTE — Progress Notes (Signed)
VASCULAR LAB PRELIMINARY  PRELIMINARY  PRELIMINARY  PRELIMINARY  Right upper extremity venous duplex completed.    Preliminary report:  See results in CV Proc  Sherren Kerns, RVT 03/24/2018, 12:49 PM

## 2018-03-24 NOTE — Plan of Care (Signed)
Will continue to educate patient and family.

## 2018-03-24 NOTE — Progress Notes (Addendum)
NAME:  Lindsay Lara, MRN:  [id number], DOB:  Aug 01, 2000, LOS: 3 ADMISSION DATE:  03/21/2018, CONSULTATION DATE:  03/21/2018 REFERRING MD:  Dr. [name] CarlScheider CHIEF COMPLAINT:  Altered Mental Status    Brief History   18 yr old female w/ PMHx ADHD, Anxiety, Bipolar depression,previous suicide attempts,Polysubstance abuse (cocaine, Marijuana,heroin and ETOH) presentsvia EMS after Mom found pt unresponsive. Bradycardic to 40sandhypotensive with SBP in the 40s. Intubated.Given Glucagon.Started on epi gtt. PCCM consulted for Intentional OD (propanolol, xanax and Seroquel)  Past Medical History   . Suicidal ideation 05/08/2012  . ADHD (attention deficit hyperactivity disorder) 05/08/2012  . Oppositional defiant disorder 05/08/2012  . Parent-child relational problem 05/08/2012  . Contraception 06/18/2012  . Idiopathic scoliosis 05/05/2015  . MDD (major depressive disorder) 09/17/2015  . Substance induced mood disorder (HCC) 09/18/2015  . Polysubstance abuse (HCC) 09/18/2015  . Chlamydia 09/21/2015  . Contact dermatitis 11/12/2015  . 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 Bipolar affective disorder, current episode hypomanic (HCC) 05/08/2016  . Overdose of benzodiazepine 01/14/2017  . Borderline personality disorder in adult [facility](HCC) 01/14/2017  . Self-inflicted laceration of wrist/arms c/w known h/o "cutting" 01/14/2017  . 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 Severe bipolar I disorder, current or most recent episode depressed (HCC) 01/17/2017  . 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 MDD (major depressive disorder), recurrent severe, without psychosis (HCC)     Significant Hospital Events   1/16>Admission  1/16>Seen by Neurology - MRI for possible lateralizing signs  Consults:  PCCM Neurology   Procedures:  1/16> Intubated 1/16>CVL placed  1/17> chest tube  placed 1/18>Bedside bronchoscopy , Therapeutic suctioning   Significant Diagnostic Tests:  CT head: Unremarkable  EKG: RBBB, Prolonged QTc 528 EEG: possible right focal cerebral dysfunction. No clear seizure activity. MRI: Negative for acute infarct or mass, Question diffuse cortical edema right hemisphere on axial T2 images. While this could be due to artifact, consider seizure related cortical edema.  Micro Data:  1/18 Urine Cx> 1/18 Urinalysis> 1/18 Blood Cx> 1/18 Tracheal Aspirate Cx> Rare GNR, pending sensitivity/cx   Antimicrobials:  1/18 Zosyn>1/19 1/18 Vancomycin>1/19 1/19 Unasyn>  Interim history/subjective:  Overnight: Febrile with Tmax 103, Hypotensive to 90s/40s, 500cc NS Bolus, cooling blanket ordered, labs ordered, CXR and antibiotics were started.  Today, [name] has episodes of intermittent agitation and remains alert.   Objective   Blood pressure 116/66, pulse 72, temperature 100.2 F (37.9 C), temperature source Oral, resp. rate 18, height 5\' 5"  (1.651 m), weight 54.2 kg, SpO2 100 %. CVP:  [5 mmHg-10 mmHg] 9 mmHg  Vent Mode: PRVC FiO2 (%):  [40 %-100 %] 40 % Set Rate:  [18 bmp] 18 bmp Vt Set:  [450 mL] 450 mL PEEP:  [5 cmH20] 5 cmH20 Plateau Pressure:  [17 cmH20-26 cmH20] 26 cmH20   Intake/Output Summary (Last 24 hours) at 03/24/2018 0615 Last data filed at 03/24/2018 [id number] Gross per 24 hour  Intake 2555.38 ml  Output 910 ml  Net 1645.38 ml   Filed Weights   03/22/18 0500  Weight: 54.2 kg    Examination: General: Somewhat calm, In NAD HENT: ETT in place, OGT in place Lungs: CTABL anteriorly  Cardiovascular: RRR, no MGR Abdomen: BS+, Soft, NTTP Extremities: Old scars on left forearm Neuro: Alert, awake, agitated, follows commands with symmetric strength. GU: Foley in place  Resolved Hospital Problem list     Assessment & Plan:  17 yr  old female w/ PMHx ADHD, Anxiety, Bipolar depression,previous suicide attempts,Polysubstance abuse  (cocaine, Marijuana,heroin and ETOH) presentsfollowing intentional drug overdose with Propranolol and Seroquel   #Acute toxic encephalopathy: Patient with multiple psychiatric diagnoses and previous attempts of overdose presented with altered mental status and suspected to have overdosed on propranolol, Seroquel and Xanax.  U tox on presentation positive for benzodiazepine and THC.  Status post glucagon and epinephrine gtt. by EMS -Frequent neurochecks -Appreciate neurology recommendation -For agitation, continue fentanyl and precedex  -Ativan 0.5-1mg  BID per tube   #Acute  respiratory failure secondary to respiratory depression from intentional overdose #Left apical pneumothorax -Continue full mechanical ventilation support -WUA/SBT daily -Continue chest tube support to seal -Albuterol prn wheezing  -Continue chest physiotherapy   #Healthcare Associated Pneumonia -RUL consolidation on CXR -Start Unasyn  -Discontinue Vancomycin, Zosyn  -Follow up CBC, lactic acid    #Hypotension and bradycardia secondary to propranolol overdose -Status post glucagon 1 mg IV x2, calcium, magnesium and epinephrine gtt. -Vital signs stable at this moment  #Alcohol use disorder #Marijuana use disorder #Opioid use disorder #Cocaine use disorder -Continue thiamine and folic acid -Nicotine patch in place  #High risk behavior -Known history of genital herpes -Continue acyclovir 400 mg twice daily.  #ADHD, bipolar disorder depression, anxiety disorder, previous suicidal attempt -Seroquel 300mg  daily  -Once stable, she will warrant psychiatry evaluation.  Best practice:  Diet:NPO Pain/Anxiety/Delirium protocol (if indicated):Fentanyl, Precedex VAP protocol (if indicated):yes DVT prophylaxis:Lovenox GI prophylaxis:PPI Glucose control:if BG exceeds >180mg /dl will start ISS Mobility:bedrest Code Status:FULL Family Communication: mother updated at bedside Disposition:ICU with  potential extubation today.  Labs   CBC: Recent Labs  Lab 03/21/18 2106 03/23/18 0626 03/23/18 2140  WBC 4.9 11.2* 13.9*  NEUTROABS 3.1  --  10.7*  HGB 8.9* 10.5* 9.9*  HCT 27.8* 32.3* 31.2*  MCV 93.0 92.6 94.0  PLT 172 141* 132*    Basic Metabolic Panel: Recent Labs  Lab 03/21/18 2106 03/23/18 0626  NA 140 140  K 3.5 3.6  CL 111 109  CO2 22 22  GLUCOSE 204* 71  BUN 6 13  CREATININE 1.32* 1.23*  CALCIUM 9.7 7.7*  MG  --  1.4*  PHOS  --  2.4*   GFR: Estimated Creatinine Clearance: 62.9 mL/min (A) (by C-G formula based on SCr of 1.23 mg/dL (H)). Recent Labs  Lab 03/21/18 2106 03/21/18 2122 03/22/18 0959 03/23/18 0626 03/23/18 2140 03/23/18 2149 03/23/18 2353  WBC 4.9  --   --  11.2* 13.9*  --   --   LATICACIDVEN  --  2.65* 1.4  --   --  1.2 1.2    Liver Function Tests: Recent Labs  Lab 03/21/18 2106 03/23/18 0626  AST 108* 361*  ALT 61* 370*  ALKPHOS 27* 61  BILITOT 0.5 1.1  PROT 3.7* 4.3*  ALBUMIN 2.2* 2.4*   No results for input(s): LIPASE, AMYLASE in the last 168 hours. No results for input(s): AMMONIA in the last 168 hours.  ABG    Component Value Date/Time   PHART 7.395 03/23/2018 0213   PCO2ART 40.7 03/23/2018 0213   PO2ART 93.0 03/23/2018 0213   HCO3 24.9 03/23/2018 0213   TCO2 26 03/23/2018 0213   O2SAT 97.0 03/23/2018 0213     Coagulation Profile: No results for input(s): INR, PROTIME in the last 168 hours.  Cardiac Enzymes: Recent Labs  Lab 03/21/18 2106  TROPONINI <0.03    HbA1C: Hgb A1c MFr Bld  Date/Time Value Ref Range Status  02/10/2018 06:55 AM  5.0 4.8 - 5.6 % Final    Comment:    (NOTE) Pre diabetes:          5.7%-6.4% Diabetes:              >6.4% Glycemic control for   <7.0% adults with diabetes   09/17/2015 06:42 PM 5.2 4.8 - 5.6 % Final    Comment:    (NOTE)         Pre-diabetes: 5.7 - 6.4         Diabetes: >6.4         Glycemic control for adults with diabetes: <7.0     CBG: Recent Labs  Lab  03/21/18 2059 03/22/18 0100 03/22/18 0344  GLUCAP 189* 178* 129*

## 2018-03-25 ENCOUNTER — Inpatient Hospital Stay (HOSPITAL_COMMUNITY): Payer: PRIVATE HEALTH INSURANCE

## 2018-03-25 LAB — BASIC METABOLIC PANEL
Anion gap: 7 (ref 5–15)
BUN: 6 mg/dL (ref 6–20)
CO2: 25 mmol/L (ref 22–32)
Calcium: 7.7 mg/dL — ABNORMAL LOW (ref 8.9–10.3)
Chloride: 109 mmol/L (ref 98–111)
Creatinine, Ser: 0.88 mg/dL (ref 0.44–1.00)
GFR calc Af Amer: >60 mL/min (ref >60)
GFR calc non Af Amer: >60 mL/min (ref >60)
Glucose, Bld: 116 mg/dL — ABNORMAL HIGH (ref 70–99)
POTASSIUM: 3.5 mmol/L (ref 3.5–5.1)
Sodium: 141 mmol/L (ref 135–145)

## 2018-03-25 LAB — PHOSPHORUS: Phosphorus: 2.3 mg/dL — ABNORMAL LOW (ref 2.5–4.6)

## 2018-03-25 LAB — HIV-1 RNA QUANT-NO REFLEX-BLD
HIV 1 RNA Quant: <20 copies/mL
LOG10 HIV-1 RNA: UNDETERMINED {Log_copies}/mL

## 2018-03-25 LAB — CBC
HCT: 30.5 % — ABNORMAL LOW (ref 36.0–46.0)
Hemoglobin: 9.9 g/dL — ABNORMAL LOW (ref 12.0–15.0)
MCH: 29.7 pg (ref 26.0–34.0)
MCHC: 32.5 g/dL (ref 30.0–36.0)
MCV: 91.6 fL (ref 80.0–100.0)
Platelets: 163 10*3/uL (ref 150–400)
RBC: 3.33 MIL/uL — AB (ref 3.87–5.11)
RDW: 12.4 % (ref 11.5–15.5)
WBC: 12.4 10*3/uL — AB (ref 4.0–10.5)
nRBC: 0.2 % (ref 0.0–0.2)

## 2018-03-25 LAB — GLUCOSE, CAPILLARY
Glucose-Capillary: 98 mg/dL (ref 70–99)
Glucose-Capillary: 97 mg/dL (ref 70–99)
Glucose-Capillary: 99 mg/dL (ref 70–99)
Glucose-Capillary: 71 mg/dL (ref 70–99)
Glucose-Capillary: 63 mg/dL — ABNORMAL LOW (ref 70–99)

## 2018-03-25 LAB — CULTURE, RESPIRATORY W GRAM STAIN

## 2018-03-25 LAB — MAGNESIUM: Magnesium: 1.6 mg/dL — ABNORMAL LOW (ref 1.7–2.4)

## 2018-03-25 LAB — CULTURE, RESPIRATORY

## 2018-03-25 LAB — TROPONIN I: Troponin I: 0.05 ng/mL (ref <0.03)

## 2018-03-25 MED ORDER — K PHOS MONO-SOD PHOS DI & MONO 155-852-130 MG PO TABS
250.0000 mg | ORAL_TABLET | Freq: Once | ORAL | Status: AC
  Administered 2018-03-25: 250 mg
  Filled 2018-03-25: qty 1

## 2018-03-25 MED ORDER — HALOPERIDOL LACTATE 5 MG/ML IJ SOLN
5.0000 mg | Freq: Four times a day (QID) | INTRAMUSCULAR | Status: DC | PRN
  Administered 2018-03-25 – 2018-03-27 (×3): 5 mg via INTRAVENOUS
  Filled 2018-03-25 (×4): qty 1

## 2018-03-25 MED ORDER — SODIUM CHLORIDE 0.9 % IV BOLUS
500.0000 mL | Freq: Once | INTRAVENOUS | Status: AC
  Administered 2018-03-25: 500 mL via INTRAVENOUS

## 2018-03-25 MED ORDER — FENTANYL CITRATE (PF) 100 MCG/2ML IJ SOLN
75.0000 ug | Freq: Once | INTRAMUSCULAR | Status: AC
  Administered 2018-03-25: 75 ug via INTRAVENOUS

## 2018-03-25 MED ORDER — MAGNESIUM SULFATE IN D5W 1-5 GM/100ML-% IV SOLN
1.0000 g | Freq: Once | INTRAVENOUS | Status: AC
  Administered 2018-03-25: 1 g via INTRAVENOUS
  Filled 2018-03-25: qty 100

## 2018-03-25 MED ORDER — SODIUM CHLORIDE 0.9% FLUSH
10.0000 mL | Freq: Two times a day (BID) | INTRAVENOUS | Status: DC
  Administered 2018-03-25 – 2018-04-05 (×19): 10 mL
  Administered 2018-04-06: 30 mL
  Administered 2018-04-06: 10 mL
  Administered 2018-04-07: 30 mL
  Administered 2018-04-07: 10 mL
  Administered 2018-04-08 (×2): 20 mL
  Administered 2018-04-09 – 2018-04-10 (×3): 10 mL

## 2018-03-25 MED ORDER — LORAZEPAM 2 MG/ML IJ SOLN
INTRAMUSCULAR | Status: AC
  Administered 2018-03-25: 0.5 mg
  Filled 2018-03-25: qty 1

## 2018-03-25 MED ORDER — ETOMIDATE 2 MG/ML IV SOLN
20.0000 mg | Freq: Once | INTRAVENOUS | Status: AC
  Administered 2018-03-25: 20 mg via INTRAVENOUS

## 2018-03-25 MED ORDER — FENTANYL CITRATE (PF) 100 MCG/2ML IJ SOLN
25.0000 ug | Freq: Once | INTRAMUSCULAR | Status: AC
  Administered 2018-03-25: 25 ug via INTRAVENOUS

## 2018-03-25 MED ORDER — MIDAZOLAM HCL 2 MG/2ML IJ SOLN
6.0000 mg | Freq: Once | INTRAMUSCULAR | Status: AC
  Administered 2018-03-25: 6 mg via INTRAVENOUS

## 2018-03-25 MED ORDER — SODIUM CHLORIDE 0.9% FLUSH: 10.0000 mL | INTRAVENOUS | Status: DC | PRN

## 2018-03-25 MED ORDER — MIDAZOLAM HCL 2 MG/2ML IJ SOLN
INTRAMUSCULAR | Status: AC
  Filled 2018-03-25: qty 6

## 2018-03-25 MED ORDER — NALOXONE HCL 0.4 MG/ML IJ SOLN
INTRAMUSCULAR | Status: AC
  Administered 2018-03-25: 12:00:00
  Filled 2018-03-25: qty 1

## 2018-03-25 MED ORDER — CHLORHEXIDINE GLUCONATE CLOTH 2 % EX PADS
6.0000 | MEDICATED_PAD | Freq: Every day | CUTANEOUS | Status: DC
  Administered 2018-03-25 – 2018-03-29 (×5): 6 via TOPICAL

## 2018-03-25 MED ORDER — ROCURONIUM BROMIDE 50 MG/5ML IV SOLN
50.0000 mg | Freq: Once | INTRAVENOUS | Status: AC
  Administered 2018-03-25: 50 mg via INTRAVENOUS

## 2018-03-25 MED ORDER — HALOPERIDOL LACTATE 5 MG/ML IJ SOLN
INTRAMUSCULAR | Status: AC
  Administered 2018-03-25: 5 mg
  Filled 2018-03-25: qty 1

## 2018-03-25 NOTE — Progress Notes (Signed)
NAME:  Lindsay Lara, MRN:  [id number], DOB:  2001/02/24, LOS: 4 ADMISSION DATE:  03/21/2018, CONSULTATION DATE:  03/21/2018 REFERRING MD:  Dr. [name] CarlScheider, CHIEF COMPLAINT:  Altered Mental Status    Brief History   18 yr old female w/ PMHx ADHD, Anxiety, Bipolar depression,previous suicide attempts,Polysubstance abuse (cocaine, Marijuana,heroin and ETOH) presentsvia EMS after Mom found pt unresponsive. Bradycardic to 40sandhypotensive with SBP in the 40s. Intubated.Given Glucagon.Started on epi gtt. PCCM consulted for Intentional OD (propanolol, xanax and Seroquel)  Past Medical History   . Suicidal ideation 05/08/2012  . ADHD (attention deficit hyperactivity disorder) 05/08/2012  . Oppositional defiant disorder 05/08/2012  . Parent-child relational problem 05/08/2012  . Contraception 06/18/2012  . Idiopathic scoliosis 05/05/2015  . MDD (major depressive disorder) 09/17/2015  . Substance induced mood disorder (HCC) 09/18/2015  . Polysubstance abuse (HCC) 09/18/2015  . Chlamydia 09/21/2015  . Contact dermatitis 11/12/2015  . 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 Bipolar affective disorder, current episode hypomanic (HCC) 05/08/2016  . Overdose of benzodiazepine 01/14/2017  . Borderline personality disorder in adult [facility](HCC) 01/14/2017  . Self-inflicted laceration of wrist/arms c/w known h/o "cutting" 01/14/2017  . 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 Severe bipolar I disorder, current or most recent episode depressed (HCC) 01/17/2017  . 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 MDD (major depressive disorder), recurrent severe, without psychosis (HCC)     Significant Hospital Events   1/16>Admission 1/16>Seen by Neurology - MRI for possible lateralizing signs  Consults:  PCCM Neurology   Procedures:  1/16> Intubated 1/16>CVL placed  1/17> chest tube  placed 1/18>Bedside bronchoscopy , Therapeutic suctioning   Significant Diagnostic Tests:  CT head: Unremarkable  EKG: RBBB, Prolonged QTc 528 EEG: possible right focal cerebral dysfunction. No clear seizure activity. MRI: Negative for acute infarct or mass, Question diffuse cortical edema right hemisphere on axial T2 images. While this could be due to artifact, consider seizure related cortical edema.  Micro Data:  1/18 Urine Cx> No growth 1/18 Blood Cx> NGTD 1/18 Tracheal Aspirate Cx> Rare GNR, Strep Agalactiae    Antimicrobials:  1/18 Zosyn>1/19 1/18 Vancomycin>1/19 1/19 Unasyn>  Interim history/subjective:  Overnight: No acute events reported. Afebrile   Today, [name] remains sedated with precedex and fentanyl  Objective   Blood pressure 124/78, pulse 71, temperature 97.9 F (36.6 C), resp. rate 18, height 5\' 5"  (1.651 m), weight 59.4 kg, last menstrual period 03/21/2018, SpO2 99 %. CVP:  [8 mmHg-11 mmHg] 11 mmHg  Vent Mode: PRVC FiO2 (%):  [30 %-60 %] 30 % Set Rate:  [18 bmp] 18 bmp Vt Set:  [450 mL] 450 mL PEEP:  [5 cmH20] 5 cmH20 Pressure Support:  [5 cmH20] 5 cmH20 Plateau Pressure:  [18 cmH20-23 cmH20] 23 cmH20   Intake/Output Summary (Last 24 hours) at 03/25/2018 0605 Last data filed at 03/25/2018 0600 Gross per 24 hour  Intake 4711.51 ml  Output 3660 ml  Net 1051.51 ml   Filed Weights   03/22/18 0500 03/25/18 0328  Weight: 54.2 kg 59.4 kg    Examination: General:  In NAD, sedated HENT: ETT in place, OGT in place Lungs: left sided Crackles anteriorly   Cardiovascular: RRR, no MGR Abdomen: BS+, Soft, NTTP Extremities: Old scars on left forearm Neuro: Alert, awake, agitated, follows commands with symmetric strength. GU: Foley in place  Resolved Hospital Problem list     Assessment & Plan:  18 yr old  female w/ PMHx ADHD, Anxiety, Bipolar depression,previous suicide attempts,Polysubstance abuse (cocaine, Marijuana,heroin and ETOH)  presentsfollowing intentional drug overdose with Propranolol and Seroquel  #Acute toxic encephalopathy:Patient with multiple psychiatric diagnoses and previous attempts of overdose presented with altered mental status and suspected to have overdosed on propranolol, Seroquel and Xanax. U tox on presentation positive for benzodiazepine and THC. Status post glucagon and epinephrine gtt. by EMS -Frequent neurochecks -Appreciate neurologyrecommendation -Discontinue fentanyl and precedex  -Ativan 0.5-1mg  BID per tube   #Acute respiratory failure secondary to respiratory depression from intentional overdose #Left apical pneumothorax -Extubate today  -Albuterol prn wheezing  -Continue chest physiotherapy   #Healthcare Associated Pneumonia -RUL consolidation on CXR. Tracheal culture grew Strep Agalactiae -Continue Unasyn   -Follow up CBC   #Hypotension and bradycardia secondary to propranolol overdose - resolved -Status post glucagon 1 mg IV x2, calcium,magnesium and epinephrine gtt. -Vital signs stable at this moment  #Alcohol use disorder #Marijuana use disorder #Opioid use disorder #Cocaine use disorder -Continue thiamine and folic acid -Nicotine patch in place  #High risk behavior -Known history of genital herpes -Continue acyclovir 400 mg twice daily.  #ADHD, bipolar disorder depression, anxiety disorder, previous suicidal attempt -Seroquel 300mg  daily  -Once stable, she will warrantpsychiatryevaluation.  Best practice:  Diet: NPO, tube feeds Pain/Anxiety/Delirium protocol (if indicated):Fentanyl, Precedex, Ativa, Seroquel  VAP protocol (if indicated):yes DVT prophylaxis:Lovenox GI prophylaxis:PPI Glucose control:if BG exceeds >180mg /dl will start ISS Mobility:bedrest Code Status:FULL Family Communication:Mother Marchelle Folks) updated at bedside Disposition:Remain in ICU  Labs   CBC: Recent Labs  Lab 03/21/18 2106 03/23/18 0626 03/23/18 2140  03/24/18 0650 03/25/18 0404  WBC 4.9 11.2* 13.9* 13.2* 12.4*  NEUTROABS 3.1  --  10.7*  --   --   HGB 8.9* 10.5* 9.9* 9.8* 9.9*  HCT 27.8* 32.3* 31.2* 30.7* 30.5*  MCV 93.0 92.6 94.0 93.0 91.6  PLT 172 141* 132* 126* 163    Basic Metabolic Panel: Recent Labs  Lab 03/21/18 2106 03/23/18 0626 03/24/18 0650 03/24/18 1407 03/24/18 1713 03/25/18 0404  NA 140 140 135  --   --  141  K 3.5 3.6 3.9  --   --  3.5  CL 111 109 104  --   --  109  CO2 22 22 22   --   --  25  GLUCOSE 204* 71 76  --   --  116*  BUN 6 13 9   --   --  6  CREATININE 1.32* 1.23* 1.05*  --   --  0.88  CALCIUM 9.7 7.7* 7.5*  --   --  7.7*  MG  --  1.4*  --  1.7 1.6* 1.6*  PHOS  --  2.4*  --  2.7 2.8 2.3*   GFR: Estimated Creatinine Clearance: 92.5 mL/min (by C-G formula based on SCr of 0.88 mg/dL). Recent Labs  Lab 03/21/18 2122 03/22/18 0959 03/23/18 0626 03/23/18 2140 03/23/18 2149 03/23/18 2353 03/24/18 0650 03/25/18 0404  WBC  --   --  11.2* 13.9*  --   --  13.2* 12.4*  LATICACIDVEN 2.65* 1.4  --   --  1.2 1.2  --   --     Liver Function Tests: Recent Labs  Lab 03/21/18 2106 03/23/18 0626  AST 108* 361*  ALT 61* 370*  ALKPHOS 27* 61  BILITOT 0.5 1.1  PROT 3.7* 4.3*  ALBUMIN 2.2* 2.4*   No results for input(s): LIPASE, AMYLASE in the last 168 hours. No results for input(s): AMMONIA in the last 168 hours.  ABG    Component Value Date/Time   PHART 7.395 03/23/2018 0213   PCO2ART 40.7 03/23/2018 0213   PO2ART 93.0 03/23/2018 0213   HCO3 24.9 03/23/2018 0213   TCO2 26 03/23/2018 0213   O2SAT 97.0 03/23/2018 0213     Coagulation Profile: No results for input(s): INR, PROTIME in the last 168 hours.  Cardiac Enzymes: Recent Labs  Lab 03/21/18 2106  TROPONINI <0.03    HbA1C: Hgb A1c MFr Bld  Date/Time Value Ref Range Status  02/10/2018 06:55 AM 5.0 4.8 - 5.6 % Final    Comment:    (NOTE) Pre diabetes:          5.7%-6.4% Diabetes:              >6.4% Glycemic control for    <7.0% adults with diabetes   09/17/2015 06:42 PM 5.2 4.8 - 5.6 % Final    Comment:    (NOTE)         Pre-diabetes: 5.7 - 6.4         Diabetes: >6.4         Glycemic control for adults with diabetes: <7.0     CBG: Recent Labs  Lab 03/22/18 0100 03/22/18 0344 03/24/18 1522 03/24/18 2337 03/25/18 0344  GLUCAP 178* 129* 74 89 98

## 2018-03-25 NOTE — Progress Notes (Signed)
SLP Cancellation Note  Patient Details Name: Lindsay Lara MRN: [id number] DOB: 28-Sep-2000   Cancelled treatment:       Reason Eval/Treat Not Completed: Medical issues which prohibited therapy. Pt reintubated shortly after extubation. Will follow up.  Rondel Baton, [place], CCC-SLP Speech-Language Pathologist Acute Rehabilitation Services Pager: [phone] Office: [phone]    [name] 03/25/2018, 4:38 PM

## 2018-03-25 NOTE — Progress Notes (Signed)
After extubation, pt remained somnolent. Could barely keep her eyes open. Pt's BP dropped (MAP=50). Dr. Dortha Schwalbe alerted to BP change. 500 cc NS bolus given per verbal order. Narcan 0.4 mg administered. Pt then became awake and combative. Team had difficulty getting accurate O2 sat on her but was able to obtain. O2 sat= 55% on NRB mask. Pt appeared to be in resp distress. Intubated pt.

## 2018-03-25 NOTE — Procedures (Signed)
Intubation Procedure Note Lindsay Lara [id number] [birth year]  Procedure: Intubation Indications: Respiratory insufficiency  Procedure Details Consent: Unable to obtain consent because of altered level of consciousness. Time Out: Verified patient identification, verified procedure, site/side was marked, verified correct patient position, special equipment/implants available, medications/allergies/relevent history reviewed, required imaging and test results available.  Performed  Maximum sterile technique was used including gloves, hand hygiene, mask and sheet.  MAC and 4    Evaluation Hemodynamic Status: BP stable throughout; O2 sats: stable throughout Patient's Current Condition: stable Complications: No apparent complications Patient did tolerate procedure well. Chest X-ray ordered to verify placement.  CXR: pending.   Gonzella Lex 03/25/2018

## 2018-03-25 NOTE — Procedures (Signed)
Extubation Procedure Note  Patient Details:   Name: Lindsay Lara DOB: 2000/06/30 MRN: [id number]   Airway Documentation:    Vent end date: 03/25/18 Vent end time: 1130   Evaluation  O2 sats: stable throughout Complications: No apparent complications Patient did tolerate procedure well. Bilateral Breath Sounds: Rhonchi   Yes, Placed on 4L [place]  Spo2 94%  Toula Moos 03/25/2018, 11:33 AM

## 2018-03-25 NOTE — Progress Notes (Addendum)
Nutrition Follow-up  DOCUMENTATION CODES:   Not applicable  INTERVENTION:    Monitor for adequacy of oral intake as diet is advanced. RD to add PO supplements as needed.  NUTRITION DIAGNOSIS:   Inadequate oral intake related to inability to eat as evidenced by NPO status.  Ongoing  GOAL:   Patient will meet greater than or equal to 90% of their needs  Progressing  MONITOR:   Diet advancement, PO intake  REASON FOR ASSESSMENT:   Consult Enteral/tube feeding initiation and management  ASSESSMENT:   18 year old female with past medical hx of ADHD, anxiety, bipolar depression, previous suicide attempts, polysubstance abuse (cocaine, marijuana, heroin and alcohol abuse). She presented to the ED via EMS after mom found patient unresponsive. Bradycardic and hypotensive. Intubated. Given glucagon. Started on epi gtt. PCCM consulted for intentional OD (propanolol, xanax and Seroquel).  Received MD Consult for TF initiation and management. Patient to be extubated this morning; TF has been stopped.  Labs reviewed. Phosphorus 2.3 (L), magnesium 1.6 (L) Medications reviewed and include folic acid, thiamine.    Diet Order:   Diet Order            Diet NPO time specified  Diet effective now              EDUCATION NEEDS:   No education needs have been identified at this time  Skin:  Skin Assessment: Reviewed RN Assessment  Last BM:  PTA  Height:   Ht Readings from Last 1 Encounters:  03/21/18 5\' 5"  (1.651 m) (61 %, Z= 0.27)*   * Growth percentiles are based on CDC (Girls, 2-20 Years) data.    Weight:   Wt Readings from Last 1 Encounters:  03/25/18 59.4 kg (55 %, Z= 0.12)*   * Growth percentiles are based on CDC (Girls, 2-20 Years) data.    Ideal Body Weight:  56.82 kg  BMI:  Body mass index is 21.79 kg/m.  Estimated Nutritional Needs:   Kcal:  1800-2000  Protein:  70-90 gm  Fluid:  1.8-2 L    Joaquin Courts, RD, LDN, CNSC Pager  [phone] After Hours Pager [phone]

## 2018-03-25 NOTE — Procedures (Signed)
Intubation Procedure Note Lindsay Lara [id number] 11-14-2000  Procedure: Intubation Indications: Airway protection and maintenance  Procedure Details Consent: Risks of procedure as well as the alternatives and risks of each were explained to the (patient/caregiver).  Consent for procedure obtained. Time Out: Verified patient identification, verified procedure, site/side was marked, verified correct patient position, special equipment/implants available, medications/allergies/relevent history reviewed, required imaging and test results available.  Performed  Maximum sterile technique was used including gloves, gown and hand hygiene.  MAC    Evaluation Hemodynamic Status: BP stable throughout; O2 sats: currently acceptable Patient's Current Condition: stable Complications: No apparent complications Patient did tolerate procedure well. Chest X-ray ordered to verify placement.  CXR: pending.   Lindsay Lara 03/25/2018

## 2018-03-26 ENCOUNTER — Inpatient Hospital Stay (HOSPITAL_COMMUNITY): Payer: PRIVATE HEALTH INSURANCE

## 2018-03-26 LAB — CBC
HCT: 29.4 % — ABNORMAL LOW (ref 36.0–46.0)
HEMOGLOBIN: 9.7 g/dL — AB (ref 12.0–15.0)
MCH: 30.6 pg (ref 26.0–34.0)
MCHC: 33 g/dL (ref 30.0–36.0)
MCV: 92.7 fL (ref 80.0–100.0)
Platelets: 178 10*3/uL (ref 150–400)
RBC: 3.17 MIL/uL — ABNORMAL LOW (ref 3.87–5.11)
RDW: 13 % (ref 11.5–15.5)
WBC: 13.3 10*3/uL — ABNORMAL HIGH (ref 4.0–10.5)
nRBC: 0 % (ref 0.0–0.2)

## 2018-03-26 LAB — GLUCOSE, CAPILLARY
Glucose-Capillary: 141 mg/dL — ABNORMAL HIGH (ref 70–99)
Glucose-Capillary: 60 mg/dL — ABNORMAL LOW (ref 70–99)
Glucose-Capillary: 72 mg/dL (ref 70–99)
Glucose-Capillary: 74 mg/dL (ref 70–99)
Glucose-Capillary: 63 mg/dL — ABNORMAL LOW (ref 70–99)
Glucose-Capillary: 121 mg/dL — ABNORMAL HIGH (ref 70–99)
Glucose-Capillary: 68 mg/dL — ABNORMAL LOW (ref 70–99)
Glucose-Capillary: 182 mg/dL — ABNORMAL HIGH (ref 70–99)

## 2018-03-26 LAB — MAGNESIUM
Magnesium: 1.6 mg/dL — ABNORMAL LOW (ref 1.7–2.4)
Magnesium: 2.1 mg/dL (ref 1.7–2.4)

## 2018-03-26 LAB — BASIC METABOLIC PANEL
Anion gap: 9 (ref 5–15)
BUN: <5 mg/dL — ABNORMAL LOW (ref 6–20)
CO2: 24 mmol/L (ref 22–32)
Calcium: 7.5 mg/dL — ABNORMAL LOW (ref 8.9–10.3)
Chloride: 110 mmol/L (ref 98–111)
Creatinine, Ser: 0.77 mg/dL (ref 0.44–1.00)
GFR calc non Af Amer: >60 mL/min (ref >60)
Glucose, Bld: 67 mg/dL — ABNORMAL LOW (ref 70–99)
Potassium: 3.4 mmol/L — ABNORMAL LOW (ref 3.5–5.1)
Sodium: 143 mmol/L (ref 135–145)

## 2018-03-26 LAB — URINE CULTURE: Culture: 70000 — AB

## 2018-03-26 LAB — EXPECTORATED SPUTUM ASSESSMENT W GRAM STAIN, RFLX TO RESP C

## 2018-03-26 LAB — PHOSPHORUS
Phosphorus: 3.8 mg/dL (ref 2.5–4.6)
Phosphorus: 3.4 mg/dL (ref 2.5–4.6)

## 2018-03-26 MED ORDER — HYDROXYZINE HCL 25 MG PO TABS
25.0000 mg | ORAL_TABLET | Freq: Three times a day (TID) | ORAL | Status: DC
  Administered 2018-03-26 – 2018-03-28 (×6): 25 mg
  Filled 2018-03-26 (×7): qty 1

## 2018-03-26 MED ORDER — SODIUM CHLORIDE 0.9 % IV SOLN
2.0000 g | Freq: Four times a day (QID) | INTRAVENOUS | Status: DC
  Administered 2018-03-26 – 2018-03-28 (×8): 2 g via INTRAVENOUS
  Filled 2018-03-26 (×9): qty 2000

## 2018-03-26 MED ORDER — DEXTROSE 50 % IV SOLN
INTRAVENOUS | Status: AC
  Administered 2018-03-26: 50 mL
  Filled 2018-03-26: qty 50

## 2018-03-26 MED ORDER — VITAL AF 1.2 CAL PO LIQD
1000.0000 mL | ORAL | Status: DC
  Administered 2018-03-26 – 2018-03-28 (×3): 1000 mL

## 2018-03-26 MED ORDER — VITAL HIGH PROTEIN PO LIQD: 1000.0000 mL | ORAL | Status: DC

## 2018-03-26 MED ORDER — MAGNESIUM SULFATE 2 GM/50ML IV SOLN
2.0000 g | Freq: Once | INTRAVENOUS | Status: AC
  Administered 2018-03-26: 2 g via INTRAVENOUS
  Filled 2018-03-26: qty 50

## 2018-03-26 MED ORDER — DEXMEDETOMIDINE HCL IN NACL 400 MCG/100ML IV SOLN
0.0000 ug/kg/h | INTRAVENOUS | Status: DC
  Administered 2018-03-26 (×3): 1.2 ug/kg/h via INTRAVENOUS
  Administered 2018-03-26: 1 ug/kg/h via INTRAVENOUS
  Administered 2018-03-27 – 2018-03-28 (×8): 1.2 ug/kg/h via INTRAVENOUS
  Filled 2018-03-26 (×10): qty 100
  Filled 2018-03-26: qty 200

## 2018-03-26 MED ORDER — DIPHENHYDRAMINE HCL 50 MG/ML IJ SOLN
12.5000 mg | Freq: Four times a day (QID) | INTRAMUSCULAR | Status: DC | PRN
  Administered 2018-03-26 – 2018-04-05 (×7): 12.5 mg via INTRAVENOUS
  Filled 2018-03-26 (×7): qty 1

## 2018-03-26 MED ORDER — POTASSIUM CHLORIDE 20 MEQ/15ML (10%) PO SOLN
40.0000 meq | Freq: Once | ORAL | Status: AC
  Administered 2018-03-26: 40 meq
  Filled 2018-03-26: qty 30

## 2018-03-26 MED ORDER — IPRATROPIUM-ALBUTEROL 0.5-2.5 (3) MG/3ML IN SOLN
3.0000 mL | RESPIRATORY_TRACT | Status: DC
  Administered 2018-03-26 – 2018-03-30 (×24): 3 mL via RESPIRATORY_TRACT
  Filled 2018-03-26 (×23): qty 3

## 2018-03-26 MED ORDER — FUROSEMIDE 10 MG/ML IJ SOLN
20.0000 mg | Freq: Once | INTRAMUSCULAR | Status: AC
  Administered 2018-03-26: 20 mg via INTRAVENOUS
  Filled 2018-03-26: qty 2

## 2018-03-26 NOTE — Progress Notes (Signed)
PT Cancellation Note  Patient Details Name: Lindsay Lara MRN: [id number] DOB: Jan 14, 2001   Cancelled Treatment:    Reason Eval/Treat Not Completed: Patient not medically ready   Reviewed chart;  Noted in OT note: Spoke with RN this AM and pt is completely sedated at this time;  Will hold today and follow for appropriateness for eval;   Van Clines, PT  Acute Rehabilitation Services Pager [phone] Office [phone]    [name] 03/26/2018, 10:36 AM

## 2018-03-26 NOTE — Progress Notes (Signed)
Hypoglycemic Event  CBG: 60  Treatment: Amp D50  Symptoms: None  Follow-up CBG: Time: 0506 CBG Result:151  Possible Reasons for Event:NPO    Lindsay Lara

## 2018-03-26 NOTE — Progress Notes (Signed)
OT Cancellation Note  Patient Details Name: Lindsay Lara MRN: [id number] DOB: 12-Mar-2000   Cancelled Treatment:    Reason Eval/Treat Not Completed: Patient not medically ready. Pt extubated and re-intubated with sedation yesterday. Spoke with RN this AM and pt is completely sedated at this time. Will follow for appropriateness for eval.  Ignacia Palma, OTR/L Acute Rehab Services Pager [phone] Office [phone]     Evette Georges 03/26/2018, 9:24 AM

## 2018-03-26 NOTE — Progress Notes (Signed)
SLP Cancellation Note  Patient Details Name: Lindsay Lara MRN: [id number] DOB: 09/14/2000   Cancelled treatment:       Reason Eval/Treat Not Completed: Patient not medically ready - reintubated, sedated per chart review. Will follow for readiness.   [name] 03/26/2018, 10:31 AM  [name], M.A. CCC-SLP Acute Herbalist [phone] Office [phone]

## 2018-03-26 NOTE — Progress Notes (Signed)
Hypoglycemic Event  CBG: 68  Treatment: Amp D50  Symptoms: None  Follow-up CBG: Time:1952 CBG Result:182  Possible Reasons for Event: decreased intake  [name]

## 2018-03-26 NOTE — Progress Notes (Addendum)
Nutrition Follow-up / Consult  DOCUMENTATION CODES:   Not applicable  INTERVENTION:    Vital AF 1.2 at 20 ml/h via OGT, increase by 10 ml every 6 hours to goal rate of 60 ml/h (1440 ml per day)   Provides 1728 kcal, 108 gm protein, 1168 ml free water daily   Consider placing Cortrak tube post-pyloric to reduce risk of aspiration (Cortrak available Wednesday)  NUTRITION DIAGNOSIS:   Inadequate oral intake related to inability to eat as evidenced by NPO status.  Ongoing  GOAL:   Patient will meet greater than or equal to 90% of their needs  Being addressed with TF  MONITOR:   Vent status, Labs, TF tolerance, I & O's  REASON FOR ASSESSMENT:   Consult Enteral/tube feeding initiation and management  ASSESSMENT:   18 year old female with past medical hx of ADHD, anxiety, bipolar depression, previous suicide attempts, polysubstance abuse (cocaine, marijuana, heroin and alcohol abuse). She presented to the ED via EMS after mom found patient unresponsive. Bradycardic and hypotensive. Intubated. Given glucagon. Started on epi gtt. PCCM consulted for intentional OD (propanolol, xanax and Seroquel).  Patient was extubated 1/20, but required re-intubation. Received MD Consult for TF initiation and management. OGT in place.   RN concerned that patient will not stay upright because she is moving around a lot. Will start TF at low rate and advance as tolerated. May need post-pyloric Cortrak tube to decrease risk of aspiration.   Patient is currently intubated on ventilator support MV: 8 L/min Temp (24hrs), Avg:98.4 F (36.9 C), Min:97.5 F (36.4 C), Max:100.9 F (38.3 C)   Labs reviewed. Potassium 3.4 (L), magnesium 1.6 (L) CBG's: [phone] Medications reviewed and include folic acid, thiamine, levophed.  Mild RUE & LUE edema noted.    Diet Order:   Diet Order            Diet NPO time specified  Diet effective now              EDUCATION NEEDS:   No education  needs have been identified at this time  Skin:  Skin Assessment: Reviewed RN Assessment  Last BM:  PTA  Height:   Ht Readings from Last 1 Encounters:  03/21/18 5\' 5"  (1.651 m) (61 %, Z= 0.27)*   * Growth percentiles are based on CDC (Girls, 2-20 Years) data.    Weight:   Wt Readings from Last 1 Encounters:  03/26/18 58.2 kg (50 %, Z= 0.00)*   * Growth percentiles are based on CDC (Girls, 2-20 Years) data.    Ideal Body Weight:  56.82 kg  BMI:  Body mass index is 21.35 kg/m.  Estimated Nutritional Needs:   Kcal:  1735  Protein:  80-95 gm  Fluid:  1.8-2 L    Joaquin Courts, RD, LDN, CNSC Pager [phone] After Hours Pager [phone]

## 2018-03-26 NOTE — Progress Notes (Signed)
Hypoglycemic Event  CBG: 63  Treatment: Amp D50  Symptoms:None  Follow-up CBG: Time: 1640 CBG Result: 121  Possible Reasons for Event: NPO  Comments/MD notified: CCM Resident Notified. Will continue to monitor.     Shenekia Riess R Zykera Abella

## 2018-03-26 NOTE — Progress Notes (Signed)
NAME:  Lindsay Lara, MRN:  [id number], DOB:  05-29-2000, LOS: 5 ADMISSION DATE:  03/21/2018, CONSULTATION DATE:  03/21/2018 REFERRING MD:  Dr. [name] Carl CHIEF COMPLAINT:  Altered Mental Status    Brief History   18 yr old female w/ PMHx ADHD, Anxiety, Bipolar depression,previous suicide attempts,Polysubstance abuse (cocaine, Marijuana,heroin and ETOH) presentsvia EMS after Mom found pt unresponsive. Bradycardic to 40sandhypotensive with SBP in the 40s. Intubated.Given Glucagon.Started on epi gtt. PCCM consulted for Intentional OD (propanolol, xanax and Seroquel)  Past Medical History   . Suicidal ideation 05/08/2012  . ADHD (attention deficit hyperactivity disorder) 05/08/2012  . Oppositional defiant disorder 05/08/2012  . Parent-child relational problem 05/08/2012  . Contraception 06/18/2012  . Idiopathic scoliosis 05/05/2015  . MDD (major depressive disorder) 09/17/2015  . Substance induced mood disorder (HCC) 09/18/2015  . Polysubstance abuse (HCC) 09/18/2015  . Chlamydia 09/21/2015  . Contact dermatitis 11/12/2015  . 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 Bipolar affective disorder, current episode hypomanic (HCC) 05/08/2016  . Overdose of benzodiazepine 01/14/2017  . Borderline personality disorder in adult [facility]) 01/14/2017  . Self-inflicted laceration of wrist/arms c/w known h/o "cutting" 01/14/2017  . 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 Severe bipolar I disorder, current or most recent episode depressed (HCC) 01/17/2017  . 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 MDD (major depressive disorder), recurrent severe, without psychosis (HCC)     Significant Hospital Events   1/16>Admission  1/16>Seen by Neurology - MRI for possible lateralizing signs  Consults:  PCCM Neurology   Procedures:  1/16> Intubated 1/16>CVL placed  1/17> chest tube  placed 1/18>Bedside bronchoscopy , Therapeutic suctioning  1/21>extubated 1/21>reintubated   Significant Diagnostic Tests:  CT head: Unremarkable  EKG: RBBB, Prolonged QTc 528 EEG: possible right focal cerebral dysfunction. No clear seizure activity. MRI: Negative for acute infarct or mass, Question diffuse cortical edema right hemisphere on axial T2 images.While this could be due to artifact, consider seizure related cortical edema.  Micro Data:  1/18 Urine Cx> 1/18 Urinalysis> 1/18 Blood Cx> 1/18 Tracheal Aspirate Cx> Rare GNR, pending sensitivity/cx  1/21 Repeat Resp culture   Antimicrobials:  1/18 Zosyn>1/19 1/18 Vancomycin>1/19 1/19 Unasyn> 1/21 1/21 Ampicillin   Interim history/subjective:  Extubation attempted yesterday with reintubation shortly thereafter. Afebrile overnight. On full sedation with fentanyl and precedex. Moving to physical stimuli and attempting to open eyes to command.   Objective   Blood pressure (!) 103/49, pulse 77, temperature 98.2 F (36.8 C), resp. rate 18, height 5\' 5"  (1.651 m), weight 58.2 kg, last menstrual period 03/21/2018, SpO2 94 %. CVP:  [7 mmHg-13 mmHg] 9 mmHg  Vent Mode: PRVC FiO2 (%):  [30 %-100 %] 60 % Set Rate:  [18 bmp] 18 bmp Vt Set:  [450 mL] 450 mL PEEP:  [5 cmH20-10 cmH20] 5 cmH20 Plateau Pressure:  [22 cmH20-32 cmH20] 25 cmH20   Intake/Output Summary (Last 24 hours) at 03/26/2018 0630 Last data filed at 03/26/2018 0600 Gross per 24 hour  Intake 3673.39 ml  Output 3630 ml  Net 43.39 ml   Filed Weights   03/22/18 0500 03/25/18 0328 03/26/18 0211  Weight: 54.2 kg 59.4 kg 58.2 kg    Examination: General: intubated, full sedation, responds to physical stimuli  HENT: ETT in place, OGT in place Lungs: course breath sounds, wheezing on left  Cardiovascular: decreased heart sounds, no m/r/g Abdomen: BS+, Soft, NTTP Extremities: Old scars on left forearm, no edema  Neuro: sedated, not following commands  GU: Foley in  place  Resolved Hospital Problem list   Left apical Pneumothorax  Assessment & Plan:  18 yr old female w/ PMHx ADHD, Anxiety, Bipolar depression,previous suicide attempts,Polysubstance abuse (cocaine, Marijuana,heroin and ETOH) presentsfollowing intentional drug overdose with Propranolol and Seroquel. Utox positive for benzos and THC.    Acute  respiratory failure secondary to respiratory depression from intentional overdose Acute toxic encephalopathy  Extubation yesterday. Patient was unable to protect airway and requiring reintubation and narcan. Will need to balance medications for productive SBT.   -WUA/SBT daily -dc chest tube -Albuterol prn wheezing  -Continue chest physiotherapy  -Frequent neurochecks -For agitation, continue fentanyl and precedex  -Ativan 0.5-1mg  BID per tube   Healthcare Associated Pneumonia -RUL consolidation on CXR -narrow to ampicillin  -Discontinue Vancomycin, Zosyn  -Follow up CBC, lactic acid  -repeat resp culture   Hypotension and bradycardia secondary to propranolol overdose -Status post glucagon 1 mg IV x2, calcium, magnesium and epinephrine gtt. -Vital signs stable at this moment  Hypomagnesemia Hypophosphatemia  Repleted yesterday. Today's lab's pending.   Alcohol use disorder Marijuana use disorder Opioid use disorder Cocaine use disorder -Continue thiamine and folic acid -Nicotine patch in place  High risk behavior -Known history of genital herpes -Continue acyclovir 400 mg twice daily.  ADHD, bipolar disorder depression, anxiety disorder, previous suicidal attempt -Seroquel 300mg  daily  -Once stable, she will warrant psychiatry evaluation. - add home atarax 25 tid  Best practice:  Diet:NPO Pain/Anxiety/Delirium protocol (if indicated):Precedex, fentanyl, versed VAP protocol (if indicated):yes DVT prophylaxis:Lovenox GI prophylaxis:PPI Glucose control:if BG exceeds >180mg /dl will start ISS Mobility:  intubated  Code Status:FULL Family Communication: mom and dad at bedside Disposition:ICU   Labs   CBC: Recent Labs  Lab 03/21/18 2106 03/23/18 0626 03/23/18 2140 03/24/18 0650 03/25/18 0404  WBC 4.9 11.2* 13.9* 13.2* 12.4*  NEUTROABS 3.1  --  10.7*  --   --   HGB 8.9* 10.5* 9.9* 9.8* 9.9*  HCT 27.8* 32.3* 31.2* 30.7* 30.5*  MCV 93.0 92.6 94.0 93.0 91.6  PLT 172 141* 132* 126* 163    Basic Metabolic Panel: Recent Labs  Lab 03/21/18 2106 03/23/18 0626 03/24/18 0650 03/24/18 1407 03/24/18 1713 03/25/18 0404  NA 140 140 135  --   --  141  K 3.5 3.6 3.9  --   --  3.5  CL 111 109 104  --   --  109  CO2 22 22 22   --   --  25  GLUCOSE 204* 71 76  --   --  116*  BUN 6 13 9   --   --  6  CREATININE 1.32* 1.23* 1.05*  --   --  0.88  CALCIUM 9.7 7.7* 7.5*  --   --  7.7*  MG  --  1.4*  --  1.7 1.6* 1.6*  PHOS  --  2.4*  --  2.7 2.8 2.3*   GFR: Estimated Creatinine Clearance: 92.5 mL/min (by C-G formula based on SCr of 0.88 mg/dL). Recent Labs  Lab 03/21/18 2122 03/22/18 0959 03/23/18 0626 03/23/18 2140 03/23/18 2149 03/23/18 2353 03/24/18 0650 03/25/18 0404  WBC  --   --  11.2* 13.9*  --   --  13.2* 12.4*  LATICACIDVEN 2.65* 1.4  --   --  1.2 1.2  --   --     Liver Function Tests: Recent Labs  Lab 03/21/18 2106 03/23/18 0626  AST 108* 361*  ALT 61* 370*  ALKPHOS  27* 61  BILITOT 0.5 1.1  PROT 3.7* 4.3*  ALBUMIN 2.2* 2.4*   No results for input(s): LIPASE, AMYLASE in the last 168 hours. No results for input(s): AMMONIA in the last 168 hours.  ABG    Component Value Date/Time   PHART 7.395 03/23/2018 0213   PCO2ART 40.7 03/23/2018 0213   PO2ART 93.0 03/23/2018 0213   HCO3 24.9 03/23/2018 0213   TCO2 26 03/23/2018 0213   O2SAT 97.0 03/23/2018 0213     Coagulation Profile: No results for input(s): INR, PROTIME in the last 168 hours.  Cardiac Enzymes: Recent Labs  Lab 03/21/18 2106 03/25/18 1220  TROPONINI <0.03 0.05*    HbA1C: Hgb A1c MFr  Bld  Date/Time Value Ref Range Status  02/10/2018 06:55 AM 5.0 4.8 - 5.6 % Final    Comment:    (NOTE) Pre diabetes:          5.7%-6.4% Diabetes:              >6.4% Glycemic control for   <7.0% adults with diabetes   09/17/2015 06:42 PM 5.2 4.8 - 5.6 % Final    Comment:    (NOTE)         Pre-diabetes: 5.7 - 6.4         Diabetes: >6.4         Glycemic control for adults with diabetes: <7.0     CBG: Recent Labs  Lab 03/25/18 1103 03/25/18 1516 03/25/18 1912 03/26/18 0435 03/26/18 0506  GLUCAP 99 71 63* 60* 141*     Raelan Burgoon A, DO 03/26/2018, 6:31 AM Pager: [phone]

## 2018-03-26 NOTE — Progress Notes (Signed)
eLink Physician-Brief Progress Note Patient Name: Lindsay Lara DOB: [birth year] MRN: [id number]   Date of Service  03/26/2018  HPI/Events of Note  Itching  eICU Interventions  Will order: 1. Benadryl 12.5 mg IV Q 6 hours PRN itching.      Intervention Category Major Interventions: Other:  [name] 03/26/2018, 9:13 PM

## 2018-03-26 NOTE — Progress Notes (Signed)
eLink Physician-Brief Progress Note Patient Name: Lindsay Lara DOB: 2000/07/15 MRN: [id number]   Date of Service  03/26/2018  HPI/Events of Note  Patient extubated today and promptly re-intubated. Request to renew Precedex orders.   eICU Interventions  Will order: 1. Precedex IV infusion. Titrate to RASS = 0 to -1.     Intervention Category Minor Interventions: Agitation / anxiety - evaluation and management  Lindsay Lara 03/26/2018, 1:04 AM

## 2018-03-26 NOTE — Care Management Note (Signed)
Case Management Note  Patient Details  Name: Lindsay Lara MRN: [id number] Date of Birth: 2000/11/29  Subjective/Objective:  Admitted post drug overdose - pt has hx of suicide attempts                  Action/Plan:   PTA from home with father.  Pt remains on ventilator and will require pysch eval    Expected Discharge Date:                  [facility]  In-House Referral:  Clinical Social Work  Discharge planning Services  CM Consult  Post Acute Care Choice:    Choice offered to:     DME Arranged:    DME Agency:     HH Arranged:    HH Agency:     Status of Service:  In process, will continue to follow  If discussed at Long Length of Stay Meetings, dates discussed:    Additional Comments:  Cherylann Parr, RN 03/26/2018, 1:58 PM

## 2018-03-27 ENCOUNTER — Inpatient Hospital Stay (HOSPITAL_COMMUNITY): Payer: PRIVATE HEALTH INSURANCE

## 2018-03-27 LAB — CBC
HCT: 28 % — ABNORMAL LOW (ref 36.0–46.0)
Hemoglobin: 9.2 g/dL — ABNORMAL LOW (ref 12.0–15.0)
MCH: 30.1 pg (ref 26.0–34.0)
MCHC: 32.9 g/dL (ref 30.0–36.0)
MCV: 91.5 fL (ref 80.0–100.0)
Platelets: 166 10*3/uL (ref 150–400)
RBC: 3.06 MIL/uL — AB (ref 3.87–5.11)
RDW: 13.2 % (ref 11.5–15.5)
WBC: 11.9 10*3/uL — ABNORMAL HIGH (ref 4.0–10.5)
nRBC: 0 % (ref 0.0–0.2)

## 2018-03-27 LAB — GLUCOSE, CAPILLARY
GLUCOSE-CAPILLARY: 100 mg/dL — AB (ref 70–99)
Glucose-Capillary: 105 mg/dL — ABNORMAL HIGH (ref 70–99)
Glucose-Capillary: 108 mg/dL — ABNORMAL HIGH (ref 70–99)
Glucose-Capillary: 70 mg/dL (ref 70–99)
Glucose-Capillary: 76 mg/dL (ref 70–99)
Glucose-Capillary: 89 mg/dL (ref 70–99)
Glucose-Capillary: 89 mg/dL (ref 70–99)

## 2018-03-27 LAB — BASIC METABOLIC PANEL
Anion gap: 6 (ref 5–15)
BUN: <5 mg/dL — ABNORMAL LOW (ref 6–20)
CALCIUM: 7.6 mg/dL — AB (ref 8.9–10.3)
CO2: 25 mmol/L (ref 22–32)
CREATININE: 0.96 mg/dL (ref 0.44–1.00)
Chloride: 111 mmol/L (ref 98–111)
GFR calc Af Amer: >60 mL/min (ref >60)
Glucose, Bld: 88 mg/dL (ref 70–99)
Potassium: 3.2 mmol/L — ABNORMAL LOW (ref 3.5–5.1)
Sodium: 142 mmol/L (ref 135–145)

## 2018-03-27 LAB — MAGNESIUM: Magnesium: 1.8 mg/dL (ref 1.7–2.4)

## 2018-03-27 LAB — PHOSPHORUS: Phosphorus: 3.2 mg/dL (ref 2.5–4.6)

## 2018-03-27 MED ORDER — POTASSIUM CHLORIDE 20 MEQ/15ML (10%) PO SOLN
30.0000 meq | ORAL | Status: AC
  Administered 2018-03-27 (×2): 30 meq
  Filled 2018-03-27 (×2): qty 30

## 2018-03-27 MED ORDER — MAGNESIUM SULFATE 2 GM/50ML IV SOLN
2.0000 g | Freq: Once | INTRAVENOUS | Status: AC
  Administered 2018-03-27: 2 g via INTRAVENOUS
  Filled 2018-03-27: qty 50

## 2018-03-27 MED ORDER — PHENYLEPHRINE HCL-NACL 10-0.9 MG/250ML-% IV SOLN
0.0000 ug/min | INTRAVENOUS | Status: DC
  Filled 2018-03-27: qty 250

## 2018-03-27 MED ORDER — NYSTATIN 100000 UNIT/ML MT SUSP
5.0000 mL | Freq: Four times a day (QID) | OROMUCOSAL | Status: AC
  Administered 2018-03-27 – 2018-04-01 (×20): 500000 [IU] via ORAL
  Filled 2018-03-27 (×20): qty 5

## 2018-03-27 MED ORDER — ACETAMINOPHEN 160 MG/5ML PO SOLN
650.0000 mg | Freq: Three times a day (TID) | ORAL | Status: DC | PRN
  Administered 2018-03-27 – 2018-03-29 (×4): 650 mg via ORAL
  Filled 2018-03-27 (×4): qty 20.3

## 2018-03-27 MED ORDER — SENNA 8.6 MG PO TABS
1.0000 | ORAL_TABLET | Freq: Every day | ORAL | Status: DC
  Administered 2018-03-27 – 2018-03-28 (×2): 8.6 mg
  Filled 2018-03-27 (×3): qty 1

## 2018-03-27 NOTE — Progress Notes (Signed)
NAME:  Lindsay Lara, MRN:  [id number], DOB:  [birth year], LOS: 6 ADMISSION DATE:  03/21/2018, CONSULTATION DATE:  03/21/2018 REFERRING MD:  Dr. [name] CarlScheider CHIEF COMPLAINT:  Altered Mental Status    Brief History   18 yr old female w/ PMHx ADHD, Anxiety, Bipolar depression,previous suicide attempts,Polysubstance abuse (cocaine, Marijuana,heroin and ETOH) presentsvia EMS after Mom found pt unresponsive. Bradycardic to 40sandhypotensive with SBP in the 40s. Intubated.Given Glucagon.Started on epi gtt. PCCM consulted for Intentional OD (propanolol, xanax and Seroquel)  Past Medical History   . Suicidal ideation 05/08/2012  . ADHD (attention deficit hyperactivity disorder) 05/08/2012  . Oppositional defiant disorder 05/08/2012  . Parent-child relational problem 05/08/2012  . Contraception 06/18/2012  . Idiopathic scoliosis 05/05/2015  . MDD (major depressive disorder) 09/17/2015  . Substance induced mood disorder (HCC) 09/18/2015  . Polysubstance abuse (HCC) 09/18/2015  . Chlamydia 09/21/2015  . Contact dermatitis 11/12/2015  . 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 Bipolar affective disorder, current episode hypomanic (HCC) 05/08/2016  . Overdose of benzodiazepine 01/14/2017  . Borderline personality disorder in adult [facility](HCC) 01/14/2017  . Self-inflicted laceration of wrist/arms c/w known h/o "cutting" 01/14/2017  . 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 Severe bipolar I disorder, current or most recent episode depressed (HCC) 01/17/2017  . 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 MDD (major depressive disorder), recurrent severe, without psychosis (HCC)     Significant Hospital Events   1/16>Admission  1/16>Seen by Neurology - MRI for possible lateralizing signs  Consults:  PCCM Neurology   Procedures:  1/16> Intubated 1/16>CVL placed  1/17> chest tube  placed 1/18>Bedside bronchoscopy , Therapeutic suctioning  1/21>extubated 1/21>reintubated   Significant Diagnostic Tests:  CT head: Unremarkable  EKG: RBBB, Prolonged QTc 528 EEG: possible right focal cerebral dysfunction. No clear seizure activity. MRI: Negative for acute infarct or mass, Question diffuse cortical edema right hemisphere on axial T2 images.While this could be due to artifact, consider seizure related cortical edema.  Micro Data:  1/18 Urine Cx> 1/18 Urinalysis> 1/18 Blood Cx> 1/18 Tracheal Aspirate Cx> Rare GNR, pending sensitivity/cx  1/21 Repeat Resp culture >  Antimicrobials:  1/18 Zosyn>1/19 1/18 Vancomycin>1/19 1/19 Unasyn> 1/21 1/21 Ampicillin >  Interim history/subjective:  Intubated and sedated.   Objective   Blood pressure (!) 109/55, pulse 75, temperature (!) 97.5 F (36.4 C), resp. rate 19, height 5\' 5"  (1.651 m), weight 55.6 kg, last menstrual period 03/21/2018, SpO2 98 %. CVP:  [9 mmHg-62 mmHg] 11 mmHg  Vent Mode: PRVC FiO2 (%):  [50 %] 50 % Set Rate:  [18 bmp] 18 bmp Vt Set:  [450 mL] 450 mL PEEP:  [5 cmH20-8 cmH20] 8 cmH20 Plateau Pressure:  [22 cmH20-31 cmH20] 27 cmH20   Intake/Output Summary (Last 24 hours) at 03/27/2018 [id number] Last data filed at 03/27/2018 0600 Gross per 24 hour  Intake 4268.26 ml  Output 2420 ml  Net 1848.26 ml   Filed Weights   03/25/18 0328 03/26/18 0211 03/27/18 0411  Weight: 59.4 kg 58.2 kg 55.6 kg    Examination: General: intubated, full sedation, responds to physical stimuli  HENT: ETT in place, OGT in place Lungs: CTA, no wheezing rales or rhonchi Cardiovascular: split s2, no m/r/g Abdomen: BS+, Soft, NTTP Extremities: Old scars on left forearm, no edema  Neuro: sedated, not following commands  GU: Foley in place  Resolved Hospital Problem list   Left apical Pneumothorax    Assessment &  Plan:  18 yr old female w/ PMHx ADHD, Anxiety, Bipolar depression,previous suicide attempts,Polysubstance  abuse (cocaine, Marijuana,heroin and ETOH) presentsfollowing intentional drug overdose with Propranolol and Seroquel. Utox positive for benzos and THC.   Acute respiratory failure secondary to respiratory depression from intentional overdose  PRVC 450 8 15 50% at 94%. Decreased PEEP to 5 and patient desaturated but improved breath sounds on physical exam. Difficulty weaning due to patient agitation with decreased sedation and ongoing hypotension 2/2 sedation  -start levo if unable to extubate  -WUA/SBT daily -Albuterol prn wheezing  -Continue chest physiotherapy  -Frequent neurochecks -For agitation, continue fentanyl and precedex - wean as able - home vistaril and ativan added prn with goal to wean IV sedation  -Ativan 0.5-1mg  BID per tube, atarax 25mg  TID   Healthcare Associated Pneumonia Pulmonary Edema  Chest xray today showing slightly worse patchy airspace disease. Received 20mg  lasix yesterday. Repeat resp gram stain 1/22 showing rare g+ clusters.  -f/u cultures  -narrowed to ampicillin yesterday  -Follow up CBC, - repleting K   Hypomagnesemia - resolved Hypophosphatemia - resolved Hypokalemia  Repleting potassium. Cont. To monitor electrolytes   Alcohol use disorder Marijuana use disorder Opioid use disorder Cocaine use disorder -Continue thiamine and folic acid -Nicotine patch in place  High risk behavior -Known history of genital herpes -Continue acyclovir 400 mg twice daily.  ADHD, bipolar disorder depression, anxiety disorder, previous suicidal attempt -Seroquel 300mg  daily  -Once stable, she will warrant psychiatry evaluation.  Constipation Last BM prior to admission  - senokot scheduled  - glycerin suppository prn   Best practice:  Diet:NPO Pain/Anxiety/Delirium protocol (if indicated):Precedex, fentanyl, versed VAP protocol (if indicated):yes DVT prophylaxis:Lovenox GI prophylaxis:PPI Glucose control:none Mobility: intubated  Code  Status:FULL Family Communication: mom and dad at bedside Disposition:ICU   Labs   CBC: Recent Labs  Lab 03/21/18 2106  03/23/18 2140 03/24/18 0650 03/25/18 0404 03/26/18 0841 03/27/18 0408  WBC 4.9   < > 13.9* 13.2* 12.4* 13.3* 11.9*  NEUTROABS 3.1  --  10.7*  --   --   --   --   HGB 8.9*   < > 9.9* 9.8* 9.9* 9.7* 9.2*  HCT 27.8*   < > 31.2* 30.7* 30.5* 29.4* 28.0*  MCV 93.0   < > 94.0 93.0 91.6 92.7 91.5  PLT 172   < > 132* 126* 163 178 166   < > = values in this interval not displayed.    Basic Metabolic Panel: Recent Labs  Lab 03/23/18 0626 03/24/18 0650  03/24/18 1713 03/25/18 0404 03/26/18 0841 03/26/18 1807 03/27/18 0408  NA 140 135  --   --  141 143  --  142  K 3.6 3.9  --   --  3.5 3.4*  --  3.2*  CL 109 104  --   --  109 110  --  111  CO2 22 22  --   --  25 24  --  25  GLUCOSE 71 76  --   --  116* 67*  --  88  BUN 13 9  --   --  6 <5*  --  <5*  CREATININE 1.23* 1.05*  --   --  0.88 0.77  --  0.96  CALCIUM 7.7* 7.5*  --   --  7.7* 7.5*  --  7.6*  MG 1.4*  --    < > 1.6* 1.6* 1.6* 2.1 1.8  PHOS 2.4*  --    < > 2.8 2.3* 3.8  3.4 3.2   < > = values in this interval not displayed.   GFR: Estimated Creatinine Clearance: 82.7 mL/min (by C-G formula based on SCr of 0.96 mg/dL). Recent Labs  Lab 03/21/18 2122 03/22/18 0959  03/23/18 2149 03/23/18 2353 03/24/18 0650 03/25/18 0404 03/26/18 0841 03/27/18 0408  WBC  --   --    < >  --   --  13.2* 12.4* 13.3* 11.9*  LATICACIDVEN 2.65* 1.4  --  1.2 1.2  --   --   --   --    < > = values in this interval not displayed.    Liver Function Tests: Recent Labs  Lab 03/21/18 2106 03/23/18 0626  AST 108* 361*  ALT 61* 370*  ALKPHOS 27* 61  BILITOT 0.5 1.1  PROT 3.7* 4.3*  ALBUMIN 2.2* 2.4*   No results for input(s): LIPASE, AMYLASE in the last 168 hours. No results for input(s): AMMONIA in the last 168 hours.  ABG    Component Value Date/Time   PHART 7.395 03/23/2018 0213   PCO2ART 40.7 03/23/2018  0213   PO2ART 93.0 03/23/2018 0213   HCO3 24.9 03/23/2018 0213   TCO2 26 03/23/2018 0213   O2SAT 97.0 03/23/2018 0213     Coagulation Profile: No results for input(s): INR, PROTIME in the last 168 hours.  Cardiac Enzymes: Recent Labs  Lab 03/21/18 2106 03/25/18 1220  TROPONINI <0.03 0.05*    HbA1C: Hgb A1c MFr Bld  Date/Time Value Ref Range Status  02/10/2018 06:55 AM 5.0 4.8 - 5.6 % Final    Comment:    (NOTE) Pre diabetes:          5.7%-6.4% Diabetes:              >6.4% Glycemic control for   <7.0% adults with diabetes   09/17/2015 06:42 PM 5.2 4.8 - 5.6 % Final    Comment:    (NOTE)         Pre-diabetes: 5.7 - 6.4         Diabetes: >6.4         Glycemic control for adults with diabetes: <7.0     CBG: Recent Labs  Lab 03/26/18 1647 03/26/18 1916 03/26/18 1951 03/27/18 0009 03/27/18 0341  GLUCAP 121* 68* 182* 76 70     Lindsay Nachtigal A, DO 03/27/2018, 6:23 AM Pager: [phone]

## 2018-03-27 NOTE — Progress Notes (Signed)
PT Cancellation Note  Patient Details Name: NIKIAH DOMINSKI MRN: [id number] DOB: [birth year]   Cancelled Treatment:    Reason Eval/Treat Not Completed: Patient not medically ready discussed with RN, patient still not appropriate at this time, lethargic and hypotensive. PT to hold will cont to follow.   Etta Grandchild, PT, DPT Acute Rehabilitation Services Pager: [phone] Office: [phone]     Etta Grandchild 03/27/2018, 10:18 AM

## 2018-03-27 NOTE — Progress Notes (Signed)
The [facility] ADULT ICU REPLACEMENT PROTOCOL FOR AM LAB REPLACEMENT ONLY  The patient does apply for the [facility] Adult ICU Electrolyte Replacment Protocol based on the criteria listed below:   1. Is GFR >/= 40 ml/min? Yes.    Patient's GFR today is >60 2. Is urine output >/= 0.5 ml/kg/hr for the last 6 hours? Yes.   Patient's UOP is 0.6 ml/kg/hr 3. Is BUN < 60 mg/dL? Yes.    Patient's BUN today is <5 4. Abnormal electrolyte(s)K3.2,Mg1.8 5. Ordered repletion with: per protocol 6. If a panic level lab has been reported, has the CCM MD in charge been notified? Yes.  .   Physician:  Laural Benes MD  Melrose Nakayama 03/27/2018 6:36 AM

## 2018-03-27 NOTE — Progress Notes (Signed)
eLink Physician-Brief Progress Note Patient Name: Lindsay Lara DOB: 2000/07/05 MRN: [id number]   Date of Service  03/27/2018  HPI/Events of Note  PEEP increased to 8 yesterday. However, PEEP = 5 ordered.   eICU Interventions  Will change PEEP order to 8.     Intervention Category Major Interventions: Respiratory failure - evaluation and management  Sommer,Steven Eugene 03/27/2018, 2:05 AM

## 2018-03-28 ENCOUNTER — Inpatient Hospital Stay (HOSPITAL_COMMUNITY): Payer: PRIVATE HEALTH INSURANCE

## 2018-03-28 DIAGNOSIS — R569 Unspecified convulsions: Secondary | ICD-10-CM

## 2018-03-28 DIAGNOSIS — R9401 Abnormal electroencephalogram [EEG]: Secondary | ICD-10-CM

## 2018-03-28 LAB — COMPREHENSIVE METABOLIC PANEL
ALK PHOS: 71 U/L (ref 38–126)
ALT: 76 U/L — AB (ref 0–44)
ANION GAP: 3 — AB (ref 5–15)
AST: 22 U/L (ref 15–41)
Albumin: 1.5 g/dL — ABNORMAL LOW (ref 3.5–5.0)
BUN: 6 mg/dL (ref 6–20)
CALCIUM: 7.5 mg/dL — AB (ref 8.9–10.3)
CO2: 24 mmol/L (ref 22–32)
Chloride: 114 mmol/L — ABNORMAL HIGH (ref 98–111)
Creatinine, Ser: 0.83 mg/dL (ref 0.44–1.00)
GFR calc non Af Amer: >60 mL/min (ref >60)
Glucose, Bld: 102 mg/dL — ABNORMAL HIGH (ref 70–99)
Potassium: 3.9 mmol/L (ref 3.5–5.1)
Sodium: 141 mmol/L (ref 135–145)
Total Bilirubin: 0.4 mg/dL (ref 0.3–1.2)
Total Protein: 4.3 g/dL — ABNORMAL LOW (ref 6.5–8.1)

## 2018-03-28 LAB — CULTURE, RESPIRATORY

## 2018-03-28 LAB — CULTURE, BLOOD (ROUTINE X 2)
Culture: NO GROWTH
Culture: NO GROWTH
SPECIAL REQUESTS: ADEQUATE

## 2018-03-28 LAB — BLOOD GAS, ARTERIAL
ACID-BASE DEFICIT: 1 mmol/L (ref 0.0–2.0)
Bicarbonate: 23.8 mmol/L (ref 20.0–28.0)
Drawn by: 414221
FIO2: 60
MECHVT: 450 mL
O2 Saturation: 96.2 %
PEEP: 8 cmH2O
PO2 ART: 85 mmHg (ref 83.0–108.0)
Patient temperature: 98.8
RATE: 18 resp/min
pCO2 arterial: 44.1 mmHg (ref 32.0–48.0)
pH, Arterial: 7.351 (ref 7.350–7.450)

## 2018-03-28 LAB — CULTURE, RESPIRATORY W GRAM STAIN

## 2018-03-28 LAB — CBC
HEMATOCRIT: 30 % — AB (ref 36.0–46.0)
Hemoglobin: 9.5 g/dL — ABNORMAL LOW (ref 12.0–15.0)
MCH: 29.1 pg (ref 26.0–34.0)
MCHC: 31.7 g/dL (ref 30.0–36.0)
MCV: 92 fL (ref 80.0–100.0)
Platelets: 159 10*3/uL (ref 150–400)
RBC: 3.26 MIL/uL — ABNORMAL LOW (ref 3.87–5.11)
RDW: 13.9 % (ref 11.5–15.5)
WBC: 12.6 10*3/uL — ABNORMAL HIGH (ref 4.0–10.5)
nRBC: 0.2 % (ref 0.0–0.2)

## 2018-03-28 LAB — MAGNESIUM: Magnesium: 1.9 mg/dL (ref 1.7–2.4)

## 2018-03-28 LAB — GLUCOSE, CAPILLARY
Glucose-Capillary: 98 mg/dL (ref 70–99)
Glucose-Capillary: 87 mg/dL (ref 70–99)
Glucose-Capillary: 101 mg/dL — ABNORMAL HIGH (ref 70–99)
Glucose-Capillary: 82 mg/dL (ref 70–99)
Glucose-Capillary: 85 mg/dL (ref 70–99)
Glucose-Capillary: 77 mg/dL (ref 70–99)

## 2018-03-28 LAB — TRIGLYCERIDES: Triglycerides: 147 mg/dL (ref <150)

## 2018-03-28 LAB — PHOSPHORUS: Phosphorus: 3.3 mg/dL (ref 2.5–4.6)

## 2018-03-28 MED ORDER — SODIUM CHLORIDE 0.9 % IV SOLN
3.0000 g | Freq: Four times a day (QID) | INTRAVENOUS | Status: DC
  Administered 2018-03-28 – 2018-03-31 (×14): 3 g via INTRAVENOUS
  Filled 2018-03-28 (×16): qty 3

## 2018-03-28 MED ORDER — LORAZEPAM 1 MG PO TABS
1.0000 mg | ORAL_TABLET | Freq: Two times a day (BID) | ORAL | Status: DC
  Administered 2018-03-28: 1 mg
  Filled 2018-03-28: qty 1

## 2018-03-28 MED ORDER — LORAZEPAM 2 MG/ML IJ SOLN
1.0000 mg | INTRAMUSCULAR | Status: DC | PRN
  Filled 2018-03-28: qty 1

## 2018-03-28 MED ORDER — ADULT MULTIVITAMIN W/MINERALS CH
1.0000 | ORAL_TABLET | Freq: Every day | ORAL | Status: DC
  Administered 2018-03-29: 1 via ORAL
  Filled 2018-03-28 (×2): qty 1

## 2018-03-28 MED ORDER — FOLIC ACID 1 MG PO TABS
1.0000 mg | ORAL_TABLET | Freq: Every day | ORAL | Status: DC
  Administered 2018-03-29: 1 mg via ORAL
  Filled 2018-03-28: qty 1

## 2018-03-28 MED ORDER — PROPOFOL 1000 MG/100ML IV EMUL
INTRAVENOUS | Status: AC
  Administered 2018-03-28: 20 ug/kg/min via INTRAVENOUS
  Filled 2018-03-28: qty 100

## 2018-03-28 MED ORDER — PROPOFOL 1000 MG/100ML IV EMUL
5.0000 ug/kg/min | INTRAVENOUS | Status: DC
  Administered 2018-03-28: 20 ug/kg/min via INTRAVENOUS
  Administered 2018-03-29: 50 ug/kg/min via INTRAVENOUS
  Filled 2018-03-28: qty 100

## 2018-03-28 MED ORDER — HYDROXYZINE HCL 25 MG PO TABS
25.0000 mg | ORAL_TABLET | Freq: Three times a day (TID) | ORAL | Status: DC
  Administered 2018-03-28: 25 mg
  Filled 2018-03-28 (×2): qty 1

## 2018-03-28 NOTE — Progress Notes (Signed)
Obtaining repeat EEG to assess for resolution of the right posterior quadrant slowing seen on the prior EEG.   Also should obtain repeat MRI while still sedated to confirm that the subtle T2 hyperintensity seen on the prior MRI in the right hemisphere (of note, there was no associated DWI signal abnormality, which militates against seizure-related edema) was artifactual.   Electronically signed: Dr. Caryl Pina

## 2018-03-28 NOTE — Progress Notes (Signed)
EEG Completed; Results Pending  

## 2018-03-28 NOTE — Progress Notes (Signed)
eLink Physician-Brief Progress Note Patient Name: Lindsay Lara DOB: [birth year] MRN: [id number]   Date of Service  03/28/2018  HPI/Events of Note  Pt with continued extreme agitation on max dose Fentanyl and Precedex  eICU Interventions  Begin Diprivan then wean off Precedex as tolerated, CIWA protocol with Ativan        Thomasene Lot Jhon Mallozzi 03/28/2018, 10:44 PM

## 2018-03-28 NOTE — Progress Notes (Signed)
NAME:  Lindsay Lara, MRN:  [id number], DOB:  29-Jan-2001, LOS: 7 ADMISSION DATE:  03/21/2018, CONSULTATION DATE:  03/21/2018 REFERRING MD:  Dr. [name] CarlScheider CHIEF COMPLAINT:  Altered Mental Status    Brief History   18 yr old female w/ PMHx ADHD, Anxiety, Bipolar depression,previous suicide attempts,Polysubstance abuse (cocaine, Marijuana,heroin and ETOH) presentsvia EMS after Mom found pt unresponsive. Bradycardic to 40sandhypotensive with SBP in the 40s. Intubated.Given Glucagon.Started on epi gtt. PCCM consulted for Intentional OD (propanolol, xanax and Seroquel)  Significant Hospital Events   1/16>Admission  1/16>Seen by Neurology - MRI for possible lateralizing signs  Consults:  PCCM Neurology   Procedures:  1/16> Intubated 1/16>CVL placed  1/17> chest tube placed 1/18>Bedside bronchoscopy , Therapeutic suctioning  1/21>extubated 1/21>reintubated   Significant Diagnostic Tests:  CT head: Unremarkable  EKG: RBBB, Prolonged QTc 528 EEG: possible right focal cerebral dysfunction. No clear seizure activity. MRI: Negative for acute infarct or mass, Question diffuse cortical edema right hemisphere on axial T2 images.While this could be due to artifact, consider seizure related cortical edema.  Micro Data:  1/18 Urine Cx> 1/18 Urinalysis> 1/18 Blood Cx> 1/18 Tracheal Aspirate Cx> Rare GNR, pending sensitivity/cx  1/21 Repeat Resp culture >  Antimicrobials:  1/18 Zosyn>1/19 1/18 Vancomycin>1/19 1/19 Unasyn> 1/21 1/21 Ampicillin > 1/23 1/23 Unasyn >   Interim history/subjective:  Intubated and sedated with max fentanyl and precedex. Following some commands.  Objective   Blood pressure (!) 103/59, pulse 84, temperature (!) 100.4 F (38 C), resp. rate (!) 25, height 5\' 5"  (1.651 m), weight 57.3 kg, last menstrual period 03/21/2018, SpO2 95 %. CVP:  [12 mmHg-19 mmHg] 19 mmHg  Vent Mode: PRVC FiO2 (%):  [50 %-60 %] 50 % Set Rate:  [18 bmp] 18 bmp Vt Set:  [450 mL]  450 mL PEEP:  [8 cmH20] 8 cmH20 Plateau Pressure:  [22 cmH20-33 cmH20] 22 cmH20   Intake/Output Summary (Last 24 hours) at 03/28/2018 13080608 Last data filed at 03/28/2018 0400 Gross per 24 hour  Intake 2208.06 ml  Output 1575 ml  Net 633.06 ml   Filed Weights   03/26/18 0211 03/27/18 0411 03/28/18 0446  Weight: 58.2 kg 55.6 kg 57.3 kg    Examination: General: intubated, full sedation, responds to physical stimuli  HENT: ETT in place, OGT in place Lungs: Course breath sounds, some rhonchi no wheezing or rales Cardiovascular: split s2, no m/r/g Abdomen: BS+, Soft, NTTP Extremities: Old scars on left forearm, trace edema  Neuro: sedated, not following commands  GU: Foley in place  Resolved Hospital Problem list   Left apical Pneumothorax    Assessment & Plan:  18 yr old female w/ PMHx ADHD, Anxiety, Bipolar depression,previous suicide attempts,Polysubstance abuse (cocaine, Marijuana,heroin and ETOH) presentsfollowing intentional drug overdose with Propranolol and Seroquel. Utox positive for benzos and THC.   Acute respiratory failure secondary to respiratory depression from intentional overdose  HAP/Aspiration Pneumonia  Pulmonary Edema  Spiking fevers the past two days. Mild leukocytosis. CXR slightly improved from yesterday.  -resp cultures pending   -WUA/SBT daily -Albuterol prn wheezing, Continue chest physiotherapy, VAP  -For agitation, continue fentanyl and precedex - wean as able - home vistaril and ativan added prn with goal to wean IV sedation  -switch back to. unasyn  -am labs, cxr  Alcohol use disorder Marijuana use disorder Opioid use disorder Cocaine use disorder -Continue thiamine and folic acid -Nicotine patch in place  High risk behavior Labial Swelling  Allergy to latex, will switch foley catheter to non-latex.  Discussed ED note with mother concerning possible vaginal trauma. Vaginal exam done in ED without acute findings or injury.  -Known history  of genital herpes -Continue acyclovir 400 mg twice daily. -switch foley catheter   ADHD, bipolar disorder depression, anxiety disorder, previous suicidal attempt -Seroquel 300mg  daily  -Once stable, she will warrant psychiatry evaluation.  Constipation Last BM 1/20 per chart.   - senokot scheduled  - glycerin suppository prn   Best practice:  Diet:NPO Pain/Anxiety/Delirium protocol (if indicated):Precedex, fentanyl, versed VAP protocol (if indicated):yes DVT prophylaxis:Lovenox GI prophylaxis:PPI Glucose control:none Mobility: intubated  Code Status:FULL Family Communication: mom at bedside Disposition:ICU   Labs   CBC: Recent Labs  Lab 03/21/18 2106  03/23/18 2140 03/24/18 0650 03/25/18 0404 03/26/18 0841 03/27/18 0408  WBC 4.9   < > 13.9* 13.2* 12.4* 13.3* 11.9*  NEUTROABS 3.1  --  10.7*  --   --   --   --   HGB 8.9*   < > 9.9* 9.8* 9.9* 9.7* 9.2*  HCT 27.8*   < > 31.2* 30.7* 30.5* 29.4* 28.0*  MCV 93.0   < > 94.0 93.0 91.6 92.7 91.5  PLT 172   < > 132* 126* 163 178 166   < > = values in this interval not displayed.    Basic Metabolic Panel: Recent Labs  Lab 03/24/18 0650  03/25/18 0404 03/26/18 0841 03/26/18 1807 03/27/18 0408 03/28/18 0430  NA 135  --  141 143  --  142 141  K 3.9  --  3.5 3.4*  --  3.2* 3.9  CL 104  --  109 110  --  111 114*  CO2 22  --  25 24  --  25 24  GLUCOSE 76  --  116* 67*  --  88 102*  BUN 9  --  6 <5*  --  <5* 6  CREATININE 1.05*  --  0.88 0.77  --  0.96 0.83  CALCIUM 7.5*  --  7.7* 7.5*  --  7.6* 7.5*  MG  --    < > 1.6* 1.6* 2.1 1.8 1.9  PHOS  --    < > 2.3* 3.8 3.4 3.2 3.3   < > = values in this interval not displayed.   GFR: Estimated Creatinine Clearance: 98.1 mL/min (by C-G formula based on SCr of 0.83 mg/dL). Recent Labs  Lab 03/21/18 2122 03/22/18 0959  03/23/18 2149 03/23/18 2353 03/24/18 0650 03/25/18 0404 03/26/18 0841 03/27/18 0408  WBC  --   --    < >  --   --  13.2* 12.4* 13.3* 11.9*    LATICACIDVEN 2.65* 1.4  --  1.2 1.2  --   --   --   --    < > = values in this interval not displayed.    Liver Function Tests: Recent Labs  Lab 03/21/18 2106 03/23/18 0626 03/28/18 0430  AST 108* 361* 22  ALT 61* 370* 76*  ALKPHOS 27* 61 71  BILITOT 0.5 1.1 0.4  PROT 3.7* 4.3* 4.3*  ALBUMIN 2.2* 2.4* 1.5*   No results for input(s): LIPASE, AMYLASE in the last 168 hours. No results for input(s): AMMONIA in the last 168 hours.  ABG    Component Value Date/Time   PHART 7.351 03/28/2018 0420   PCO2ART 44.1 03/28/2018 0420   PO2ART 85.0 03/28/2018 0420   HCO3 23.8 03/28/2018 0420   TCO2 26 03/23/2018 0213   ACIDBASEDEF 1.0 03/28/2018 0420   O2SAT 96.2 03/28/2018 0420  Coagulation Profile: No results for input(s): INR, PROTIME in the last 168 hours.  Cardiac Enzymes: Recent Labs  Lab 03/21/18 2106 03/25/18 1220  TROPONINI <0.03 0.05*    HbA1C: Hgb A1c MFr Bld  Date/Time Value Ref Range Status  02/10/2018 06:55 AM 5.0 4.8 - 5.6 % Final    Comment:    (NOTE) Pre diabetes:          5.7%-6.4% Diabetes:              >6.4% Glycemic control for   <7.0% adults with diabetes   09/17/2015 06:42 PM 5.2 4.8 - 5.6 % Final    Comment:    (NOTE)         Pre-diabetes: 5.7 - 6.4         Diabetes: >6.4         Glycemic control for adults with diabetes: <7.0     CBG: Recent Labs  Lab 03/27/18 1107 03/27/18 1512 03/27/18 1905 03/27/18 2329 03/28/18 0300  GLUCAP 100* 89 108* 89 98     Adya Wirz A, DO 03/28/2018, 6:08 AM Pager: [phone]

## 2018-03-28 NOTE — Progress Notes (Signed)
eLink Physician-Brief Progress Note Patient Name: Lindsay Lara DOB: 09/01/2000 MRN: [id number]   Date of Service  03/28/2018  HPI/Events of Note  Episodic agitation despite Fentanyl infusion and Precedex maxed out.  eICU Interventions  Patients scheduled Seroquel and Ativan ordered to be given 2 hours early. Lights ordered dimmed and soothing music suggested.        Migdalia Dk 03/28/2018, 8:41 PM

## 2018-03-28 NOTE — Progress Notes (Signed)
Subjective: Intubated and sedated. Will arouse to a semi-awake state with noxious stimuli.   Objective: Current vital signs: BP (!) 105/49   Pulse 86   Temp (!) 101.5 F (38.6 C)   Resp (!) 23   Ht 5' 5" (1.651 m)   Wt 57.3 kg   LMP 03/21/2018 (Exact Date) Comment: mother stated pt was having period 03/21/2018, neg preg test 03/21/2018  SpO2 93%   BMI 21.02 kg/m  Vital signs in last 24 hours: Temp:  [97.5 F (36.4 C)-101.8 F (38.8 C)] 101.5 F (38.6 C) (01/23 0700) Pulse Rate:  [72-90] 86 (01/23 0726) Resp:  [11-30] 23 (01/23 0726) BP: (75-141)/(22-92) 105/49 (01/23 0726) SpO2:  [88 %-100 %] 93 % (01/23 0726) FiO2 (%):  [50 %-60 %] 60 % (01/23 0726) Weight:  [57.3 kg] 57.3 kg (01/23 0446)  Intake/Output from previous day: 01/22 0701 - 01/23 0700 In: 2088.1 [I.V.:1192.7; NG/GT:450; IV Piggyback:445.3] Out: 1875 [Urine:1875] Intake/Output this shift: No intake/output data recorded. Nutritional status:  Diet Order    None     HEENT: [place]/AT Lungs: Intubated Ext: Mild edema of upper extremities noted. No cyanosis. Scars from old cut marks noted.   Neurologic Exam: Ment: Intubated and sedated. Will arouse to a semi-awake state with some verbal as well as with noxious stimuli. CN: PERRL. Eyes conjugately at midline. No nystagmus. Face flaccidly symmetric.  Motor/Sensory: Withdraws all 4 extremities semipurposefully to noxious.  Reflexes: 2+ bilateral biceps and brachioradialis. 3+ patellae, 2+ achilles. Toes downgoing.    Lab Results: Results for orders placed or performed during the hospital encounter of 03/21/18 (from the past 48 hour(s))  CBC     Status: Abnormal   Collection Time: 03/26/18  8:41 AM  Result Value Ref Range   WBC 13.3 (H) 4.0 - 10.5 K/uL   RBC 3.17 (L) 3.87 - 5.11 MIL/uL   Hemoglobin 9.7 (L) 12.0 - 15.0 g/dL   HCT 29.4 (L) 36.0 - 46.0 %   MCV 92.7 80.0 - 100.0 fL   MCH 30.6 26.0 - 34.0 pg   MCHC 33.0 30.0 - 36.0 g/dL   RDW 13.0 11.5 - 15.5 %    Platelets 178 150 - 400 K/uL   nRBC 0.0 0.0 - 0.2 %    Comment: Performed at [facility], [address] [place], [place] [address]  Basic metabolic panel     Status: Abnormal   Collection Time: 03/26/18  8:41 AM  Result Value Ref Range   Sodium 143 135 - 145 mmol/L   Potassium 3.4 (L) 3.5 - 5.1 mmol/L   Chloride 110 98 - 111 mmol/L   CO2 24 22 - 32 mmol/L   Glucose, Bld 67 (L) 70 - 99 mg/dL   BUN <5 (L) 6 - 20 mg/dL   Creatinine, Ser 0.77 0.44 - 1.00 mg/dL   Calcium 7.5 (L) 8.9 - 10.3 mg/dL   GFR calc non Af Amer >60 >60 mL/min   GFR calc Af Amer >60 >60 mL/min   Anion gap 9 5 - 15    Comment: Performed at [facility], [address] [place], [place] [address]  Magnesium     Status: Abnormal   Collection Time: 03/26/18  8:41 AM  Result Value Ref Range   Magnesium 1.6 (L) 1.7 - 2.4 mg/dL    Comment: Performed at [facility], [address] [place], [place] [address]  Phosphorus     Status: None   Collection Time: 03/26/18    8:41 AM  Result Value Ref Range   Phosphorus 3.8 2.5 - 4.6 mg/dL    Comment: Performed at [facility], [address] [place], [place] [address]  Glucose, capillary     Status: None   Collection Time: 03/26/18 11:07 AM  Result Value Ref Range   Glucose-Capillary 74 70 - 99 mg/dL   Comment 1 Notify RN    Comment 2 Document in Chart   Glucose, capillary     Status: Abnormal   Collection Time: 03/26/18  3:44 PM  Result Value Ref Range   Glucose-Capillary 63 (L) 70 - 99 mg/dL   Comment 1 Notify RN   Glucose, capillary     Status: Abnormal   Collection Time: 03/26/18  4:47 PM  Result Value Ref Range   Glucose-Capillary 121 (H) 70 - 99 mg/dL   Comment 1 Notify RN    Comment 2 Document in Chart   Magnesium     Status: None   Collection Time: 03/26/18  6:07 PM  Result Value Ref Range   Magnesium 2.1 1.7 - 2.4 mg/dL    Comment: Performed at [facility], [address] [place], [place] [address]  Phosphorus     Status:  None   Collection Time: 03/26/18  6:07 PM  Result Value Ref Range   Phosphorus 3.4 2.5 - 4.6 mg/dL    Comment: Performed at [facility], [address] [place], [place] [address]  Glucose, capillary     Status: Abnormal   Collection Time: 03/26/18  7:16 PM  Result Value Ref Range   Glucose-Capillary 68 (L) 70 - 99 mg/dL   Comment 1 Notify RN   Glucose, capillary     Status: Abnormal   Collection Time: 03/26/18  7:51 PM  Result Value Ref Range   Glucose-Capillary 182 (H) 70 - 99 mg/dL  Glucose, capillary     Status: None   Collection Time: 03/27/18 12:09 AM  Result Value Ref Range   Glucose-Capillary 76 70 - 99 mg/dL  Glucose, capillary     Status: None   Collection Time: 03/27/18  3:41 AM  Result Value Ref Range   Glucose-Capillary 70 70 - 99 mg/dL  CBC     Status: Abnormal   Collection Time: 03/27/18  4:08 AM  Result Value Ref Range   WBC 11.9 (H) 4.0 - 10.5 K/uL   RBC 3.06 (L) 3.87 - 5.11 MIL/uL   Hemoglobin 9.2 (L) 12.0 - 15.0 g/dL   HCT 28.0 (L) 36.0 - 46.0 %   MCV 91.5 80.0 - 100.0 fL   MCH 30.1 26.0 - 34.0 pg   MCHC 32.9 30.0 - 36.0 g/dL   RDW 13.2 11.5 - 15.5 %   Platelets 166 150 - 400 K/uL   nRBC 0.0 0.0 - 0.2 %    Comment: Performed at [facility], [address] [place], [place] [address]  Basic metabolic panel     Status: Abnormal   Collection Time: 03/27/18  4:08 AM  Result Value Ref Range   Sodium 142 135 - 145 mmol/L   Potassium 3.2 (L) 3.5 - 5.1 mmol/L   Chloride 111 98 - 111 mmol/L   CO2 25 22 - 32 mmol/L   Glucose, Bld 88 70 - 99 mg/dL   BUN <5 (L) 6 - 20 mg/dL   Creatinine, Ser 0.96 0.44 - 1.00 mg/dL   Calcium 7.6 (L) 8.9 - 10.3 mg/dL   GFR calc non   Af Amer >60 >60 mL/min   GFR calc Af Amer >60 >60 mL/min   Anion gap 6 5 - 15    Comment: Performed at [facility], [address] [place], [place] [address]  Magnesium     Status: None   Collection Time: 03/27/18  4:08 AM  Result Value Ref Range   Magnesium 1.8 1.7 - 2.4  mg/dL    Comment: Performed at [facility], [address] [place], [place] [address]  Phosphorus     Status: None   Collection Time: 03/27/18  4:08 AM  Result Value Ref Range   Phosphorus 3.2 2.5 - 4.6 mg/dL    Comment: Performed at [facility], [address] [place], [place] [address]  Glucose, capillary     Status: Abnormal   Collection Time: 03/27/18  7:11 AM  Result Value Ref Range   Glucose-Capillary 105 (H) 70 - 99 mg/dL  Glucose, capillary     Status: Abnormal   Collection Time: 03/27/18 11:07 AM  Result Value Ref Range   Glucose-Capillary 100 (H) 70 - 99 mg/dL  Glucose, capillary     Status: None   Collection Time: 03/27/18  3:12 PM  Result Value Ref Range   Glucose-Capillary 89 70 - 99 mg/dL  Glucose, capillary     Status: Abnormal   Collection Time: 03/27/18  7:05 PM  Result Value Ref Range   Glucose-Capillary 108 (H) 70 - 99 mg/dL  Glucose, capillary     Status: None   Collection Time: 03/27/18 11:29 PM  Result Value Ref Range   Glucose-Capillary 89 70 - 99 mg/dL  Glucose, capillary     Status: None   Collection Time: 03/28/18  3:00 AM  Result Value Ref Range   Glucose-Capillary 98 70 - 99 mg/dL  Blood gas, arterial     Status: None   Collection Time: 03/28/18  4:20 AM  Result Value Ref Range   FIO2 60.00    Delivery systems VENTILATOR    Mode PRESSURE REGULATED VOLUME CONTROL    VT 450 mL   LHR 18.0 resp/min   Peep/cpap 8.0 cm H20   pH, Arterial 7.351 7.350 - 7.450   pCO2 arterial 44.1 32.0 - 48.0 mmHg   pO2, Arterial 85.0 83.0 - 108.0 mmHg   Bicarbonate 23.8 20.0 - 28.0 mmol/L   Acid-base deficit 1.0 0.0 - 2.0 mmol/L   O2 Saturation 96.2 %   Patient temperature 98.8    Collection site LEFT RADIAL    Drawn by [id number]    Sample type ARTERIAL DRAW    Allens test (pass/fail) PASS PASS  Comprehensive metabolic panel     Status: Abnormal   Collection Time: 03/28/18  4:30 AM  Result Value Ref Range   Sodium 141 135 - 145 mmol/L   Potassium  3.9 3.5 - 5.1 mmol/L    Comment: NO VISIBLE HEMOLYSIS   Chloride 114 (H) 98 - 111 mmol/L   CO2 24 22 - 32 mmol/L   Glucose, Bld 102 (H) 70 - 99 mg/dL   BUN 6 6 - 20 mg/dL   Creatinine, Ser 0.83 0.44 - 1.00 mg/dL   Calcium 7.5 (L) 8.9 - 10.3 mg/dL   Total Protein 4.3 (L) 6.5 - 8.1 g/dL   Albumin 1.5 (L) 3.5 - 5.0 g/dL   AST 22 15 - 41 U/L   ALT 76 (H) 0 - 44 U/L   Alkaline Phosphatase 71 38 - 126 U/L     Total Bilirubin 0.4 0.3 - 1.2 mg/dL   GFR calc non Af Amer >60 >60 mL/min   GFR calc Af Amer >60 >60 mL/min   Anion gap 3 (L) 5 - 15    Comment: Performed at [facility], [address] [place], [place] [address]  Magnesium     Status: None   Collection Time: 03/28/18  4:30 AM  Result Value Ref Range   Magnesium 1.9 1.7 - 2.4 mg/dL    Comment: Performed at [facility], [address] [place], [place] [address]  Phosphorus     Status: None   Collection Time: 03/28/18  4:30 AM  Result Value Ref Range   Phosphorus 3.3 2.5 - 4.6 mg/dL    Comment: Performed at [facility], [address] [place], [place] [address]  CBC     Status: Abnormal   Collection Time: 03/28/18  6:27 AM  Result Value Ref Range   WBC 12.6 (H) 4.0 - 10.5 K/uL   RBC 3.26 (L) 3.87 - 5.11 MIL/uL   Hemoglobin 9.5 (L) 12.0 - 15.0 g/dL   HCT 30.0 (L) 36.0 - 46.0 %   MCV 92.0 80.0 - 100.0 fL   MCH 29.1 26.0 - 34.0 pg   MCHC 31.7 30.0 - 36.0 g/dL   RDW 13.9 11.5 - 15.5 %   Platelets 159 150 - 400 K/uL   nRBC 0.2 0.0 - 0.2 %    Comment: Performed at [facility], [address] [place], [place] [address]  Glucose, capillary     Status: None   Collection Time: 03/28/18  7:16 AM  Result Value Ref Range   Glucose-Capillary 87 70 - 99 mg/dL    Recent Results (from the past 240 hour(s))  Urine culture     Status: None   Collection Time: 03/21/18  9:09 PM  Result Value Ref Range Status   Specimen Description URINE, CATHETERIZED  Final   Special Requests NONE  Final   Culture   Final     NO GROWTH Performed at [facility], [address] [place], [place] [address]    Report Status 03/23/2018 FINAL  Final  MRSA PCR Screening     Status: None   Collection Time: 03/22/18 12:55 AM  Result Value Ref Range Status   MRSA by PCR NEGATIVE NEGATIVE Final    Comment:        The GeneXpert MRSA Assay (FDA approved for NASAL specimens only), is one component of a comprehensive MRSA colonization surveillance program. It is not intended to diagnose MRSA infection nor to guide or monitor treatment for MRSA infections. Performed at [facility], [address] [place], [place] [address]   Culture, Urine     Status: Abnormal   Collection Time: 03/23/18 12:05 AM  Result Value Ref Range Status   Specimen Description URINE, CATHETERIZED  Final   Special Requests   Final    NONE Performed at [facility], [address] [place], [place] [address]    Culture 70,000 COLONIES/mL ESCHERICHIA COLI (A)  Final   Report Status 03/26/2018 FINAL  Final   Organism ID, Bacteria ESCHERICHIA COLI (A)  Final      Susceptibility   Escherichia coli - MIC*    AMPICILLIN <=2 SENSITIVE Sensitive     CEFAZOLIN <=4 SENSITIVE Sensitive     CEFTRIAXONE <=1 SENSITIVE Sensitive     CIPROFLOXACIN <=0.25 SENSITIVE Sensitive       GENTAMICIN <=1 SENSITIVE Sensitive     IMIPENEM <=0.25 SENSITIVE Sensitive     NITROFURANTOIN <=16 SENSITIVE Sensitive     TRIMETH/SULFA <=20 SENSITIVE Sensitive     AMPICILLIN/SULBACTAM <=2 SENSITIVE Sensitive     PIP/TAZO <=4 SENSITIVE Sensitive     Extended ESBL NEGATIVE Sensitive     * 70,000 COLONIES/mL ESCHERICHIA COLI  Culture, respiratory (non-expectorated)     Status: None   Collection Time: 03/23/18  3:03 AM  Result Value Ref Range Status   Specimen Description TRACHEAL ASPIRATE  Final   Special Requests NONE  Final   Gram Stain   Final    ABUNDANT WBC PRESENT, PREDOMINANTLY PMN NO SQUAMOUS EPITHELIAL CELLS SEEN RARE GRAM NEGATIVE  RODS RARE GRAM POSITIVE COCCI IN PAIRS    Culture   Final    RARE GROUP B STREP(S.AGALACTIAE)ISOLATED TESTING AGAINST S. AGALACTIAE NOT ROUTINELY PERFORMED DUE TO PREDICTABILITY OF AMP/PEN/VAN SUSCEPTIBILITY. Performed at [facility], [place] [address] [place], [place] [address]    Report Status 03/25/2018 FINAL  Final  Culture, blood (Routine X 2) w Reflex to ID Panel     Status: None (Preliminary result)   Collection Time: 03/23/18 11:04 PM  Result Value Ref Range Status   Specimen Description BLOOD LEFT ANTECUBITAL  Final   Special Requests   Final    BOTTLES DRAWN AEROBIC ONLY Blood Culture adequate volume   Culture   Final    NO GROWTH 4 DAYS Performed at [facility], [place] [address] [place], [place] [address]    Report Status PENDING  Incomplete  Culture, blood (Routine X 2) w Reflex to ID Panel     Status: None (Preliminary result)   Collection Time: 03/23/18 11:18 PM  Result Value Ref Range Status   Specimen Description BLOOD LEFT ARM  Final   Special Requests   Final    BOTTLES DRAWN AEROBIC ONLY Blood Culture results may not be optimal due to an inadequate volume of blood received in culture bottles   Culture   Final    NO GROWTH 4 DAYS Performed at [facility], [place] [address], [place], [place] [address]    Report Status PENDING  Incomplete  Expectorated sputum assessment w rflx to resp cult     Status: None   Collection Time: 03/26/18  8:09 AM  Result Value Ref Range Status   Specimen Description EXPECTORATED SPUTUM  Final   Special Requests NONE  Final   Sputum evaluation   Final    THIS SPECIMEN IS ACCEPTABLE FOR SPUTUM CULTURE Performed at [facility], [place] [address] [place], [place] [address]    Report Status 03/26/2018 FINAL  Final  Culture, respiratory     Status: None (Preliminary result)   Collection Time: 03/26/18  8:09 AM  Result Value Ref Range Status   Specimen Description EXPECTORATED SPUTUM  Final   Special Requests NONE  Reflexed from [phone]  Final   Gram Stain   Final    MODERATE WBC PRESENT, PREDOMINANTLY PMN RARE GRAM POSITIVE COCCI IN CLUSTERS Performed at [facility], [place] [address] [place], [place] [address]    Culture PENDING  Incomplete   Report Status PENDING  Incomplete    Lipid Panel No results for input(s): CHOL, TRIG, HDL, CHOLHDL, VLDL, LDLCALC in the last 72 hours.  Studies/Results: Dg Chest Port 1 View  Result Date: 03/28/2018 CLINICAL DATA:  Endotracheal intubation EXAM: PORTABLE CHEST 1 VIEW COMPARISON:  Yesterday FINDINGS:  Endotracheal tube tip at the clavicular heads. Left subclavian line with tip at the upper cavoatrial junction. The orogastric tube reaches the stomach. Unchanged diffuse hazy lung opacity. No pneumothorax. Evidence of layering pleural effusions. IMPRESSION: 1. Stable and unremarkable hardware positioning. 2. Unchanged extensive airspace disease with layering pleural effusions. Electronically Signed   By: Monte Fantasia M.D.   On: 03/28/2018 07:56   Dg Chest Port 1 View  Result Date: 03/27/2018 CLINICAL DATA:  Endotracheal intubation EXAM: PORTABLE CHEST 1 VIEW COMPARISON:  Yesterday FINDINGS: Endotracheal tube tip at the clavicular heads. The orogastric tube reaches the stomach. Left subclavian line with tip at the upper cavoatrial junction. Dense bilateral airspace disease with pleural fluid on the right at least. No evident pneumothorax. Normal heart size. IMPRESSION: 1. Stable hardware in good position. 2. Unchanged extensive airspace disease with right pleural effusion. Electronically Signed   By: Monte Fantasia M.D.   On: 03/27/2018 05:43    Medications:  Scheduled: . acyclovir  400 mg Per Tube BID  . chlorhexidine gluconate (MEDLINE KIT)  15 mL Mouth Rinse BID  . Chlorhexidine Gluconate Cloth  6 each Topical Q0600  . enoxaparin (LOVENOX) injection  40 mg Subcutaneous Daily  . folic acid  1 mg Intravenous Daily  . hydrOXYzine  25 mg Per Tube TID  .  ipratropium-albuterol  3 mL Nebulization Q4H  . LORazepam  0.5-1 mg Per Tube BID  . mouth rinse  15 mL Mouth Rinse 10 times per day  . nicotine  14 mg Transdermal Daily  . nystatin  5 mL Oral QID  . pantoprazole (PROTONIX) IV  40 mg Intravenous Daily  . QUEtiapine  300 mg Per Tube QHS  . senna  1 tablet Per Tube Daily  . sodium chloride flush  10-40 mL Intracatheter Q12H  . thiamine injection  100 mg Intravenous Daily   Continuous: . sodium chloride 10 mL/hr at 03/28/18 0400  . sodium chloride Stopped (03/22/18 0057)  . ampicillin-sulbactam (UNASYN) IV    . dexmedetomidine (PRECEDEX) IV infusion 1.2 mcg/kg/hr (03/28/18 0415)  . feeding supplement (VITAL AF 1.2 CAL) 1,000 mL (03/28/18 0128)  . fentaNYL infusion INTRAVENOUS 400 mcg/hr (03/28/18 0400)  . norepinephrine (LEVOPHED) Adult infusion Stopped (03/24/18 2306)  . phenylephrine (NEO-SYNEPHRINE) Adult infusion Stopped (03/27/18 1325)     Assessment: 18 year old female presenting after intentional drug overdose. Neurology was consulted for continued unresponsiveness concerning for possible seizure.  1. On initial Neurology consultation examination, with noxious stimulation, there was strong localization with the left upper extremity and left lower extremity and weak localization/withdrawal on the right upper extremity and lower extremity. 2. Initial EEG was suggestive of a focal cerebral dysfunction in the right posterior quadrant, which was not consistent with the brisker responses on her right side during exam. 3. MRI brain showed equivocal finding suggestive of possible diffuse cortical edema in the right hemisphere on axial T2 images. While this could be due to artifact, there was consideration for possible seizure related cortical edema. 4. Has significant pulmonary morbidity from the overdose: Acuterespiratory failure secondary to respiratory depression from intentional overdose; HAP/Aspiration Pneumonia; Pulmonary Edema.  5.  History of polysubstance abuse, including marijuana, opiods, benzodiazepines, cocaine and EtOH. She is on thiamine and folate. Per family, her drug of choice is street Xanax which she uses at high doses, including the consumption of several "bars" at times when under emotional stress. At significant risk for benzodiazepine withdrawal syndrome.  6. Psychiatric history includes bipolar disorder, anxiety disorder,  depression and previous suicide attempt. CCM is planning on obtaining a psychiatry evaluation after the patient is extubated, stable and awake. 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 If this diagnosis is confirmed, she may benefit from Dialectic Behavioral Therapy (DBT). Discussed with family at the bedside.   Recommendations: 1. Obtaining repeat EEG to assess for resolution of the right posterior quadrant slowing seen on the prior EEG.  2. Also should obtain repeat MRI to confirm that the subtle T2 hyperintensity seen on the prior MRI in the right hemisphere (of note, there was no associated DWI signal abnormality, which militates against seizure-related edema) was artifactual.  3. Scheduled benzodiazepine. 4. Wean off the vent as tolerated. 5. Inpatient psychiatry and psychology consultations when she is awake and lucid.  40 minutes spent in the neurological evaluation and management of this critically ill patient. 50% of this time was spent discussing the diagnostic and treatment plan with the family.    LOS: 7 days   @Electronically signed: Dr.   @ 03/28/2018  8:13 AM   

## 2018-03-28 NOTE — Procedures (Signed)
History: 18 yo F with overdose.   Sedation: Precedex/fentanyl  Technique: This is a 21 channel routine scalp EEG performed at the bedside with bipolar and monopolar montages arranged in accordance to the international 10/20 system of electrode placement. One channel was dedicated to EKG recording.    Background: There is a posterior dominant rhythm of 8 - 9 Hz which is well formed. With drowsiness, which is present for most of the EEG, there are bifrontally predominant bursts of irregular delta activity. No asymmetry or epileptiform activity was seen.   Photic stimulation: Physiologic driving is Not performed  EEG Abnormalities: None  Clinical Interpretation: This normal EEG is recorded in the waking and drowsy state. There was no seizure or seizure predisposition recorded on this study. Please note that lack of epileptiform activity on EEG does not preclude the possibility of epilepsy.   Ritta Slot, MD [facility] [phone]  If 7pm- 7am, please page neurology on call as listed in AMION.

## 2018-03-28 NOTE — Progress Notes (Signed)
650mg  Tylenol give via tube for temp

## 2018-03-29 ENCOUNTER — Inpatient Hospital Stay (HOSPITAL_COMMUNITY): Payer: PRIVATE HEALTH INSURANCE

## 2018-03-29 DIAGNOSIS — L8990 Pressure ulcer of unspecified site, unspecified stage: Secondary | ICD-10-CM

## 2018-03-29 LAB — BASIC METABOLIC PANEL
Anion gap: 3 — ABNORMAL LOW (ref 5–15)
BUN: 7 mg/dL (ref 6–20)
CO2: 25 mmol/L (ref 22–32)
Calcium: 7.6 mg/dL — ABNORMAL LOW (ref 8.9–10.3)
Chloride: 113 mmol/L — ABNORMAL HIGH (ref 98–111)
Creatinine, Ser: 0.97 mg/dL (ref 0.44–1.00)
GFR calc Af Amer: >60 mL/min (ref >60)
GFR calc non Af Amer: >60 mL/min (ref >60)
GLUCOSE: 102 mg/dL — AB (ref 70–99)
Potassium: 4.2 mmol/L (ref 3.5–5.1)
Sodium: 141 mmol/L (ref 135–145)

## 2018-03-29 LAB — CBC
HEMATOCRIT: 30.7 % — AB (ref 36.0–46.0)
Hemoglobin: 9.4 g/dL — ABNORMAL LOW (ref 12.0–15.0)
MCH: 28.7 pg (ref 26.0–34.0)
MCHC: 30.6 g/dL (ref 30.0–36.0)
MCV: 93.9 fL (ref 80.0–100.0)
Platelets: 147 10*3/uL — ABNORMAL LOW (ref 150–400)
RBC: 3.27 MIL/uL — ABNORMAL LOW (ref 3.87–5.11)
RDW: 14.4 % (ref 11.5–15.5)
WBC: 14.7 10*3/uL — ABNORMAL HIGH (ref 4.0–10.5)
nRBC: 0 % (ref 0.0–0.2)

## 2018-03-29 LAB — GLUCOSE, CAPILLARY
Glucose-Capillary: 104 mg/dL — ABNORMAL HIGH (ref 70–99)
Glucose-Capillary: 83 mg/dL (ref 70–99)
Glucose-Capillary: 99 mg/dL (ref 70–99)
Glucose-Capillary: 92 mg/dL (ref 70–99)

## 2018-03-29 LAB — MAGNESIUM: Magnesium: 1.8 mg/dL (ref 1.7–2.4)

## 2018-03-29 MED ORDER — FENTANYL CITRATE (PF) 100 MCG/2ML IJ SOLN: 50.0000 ug | Freq: Once | INTRAMUSCULAR | Status: DC

## 2018-03-29 MED ORDER — FENTANYL CITRATE (PF) 100 MCG/2ML IJ SOLN
INTRAMUSCULAR | Status: AC
  Administered 2018-03-29: 25 ug via INTRAVENOUS
  Filled 2018-03-29: qty 2

## 2018-03-29 MED ORDER — FENTANYL CITRATE (PF) 100 MCG/2ML IJ SOLN
25.0000 ug | Freq: Once | INTRAMUSCULAR | Status: AC
  Administered 2018-03-29: 25 ug via INTRAVENOUS

## 2018-03-29 MED ORDER — FUROSEMIDE 10 MG/ML IJ SOLN
40.0000 mg | Freq: Once | INTRAMUSCULAR | Status: AC
  Administered 2018-03-29: 40 mg via INTRAVENOUS
  Filled 2018-03-29: qty 4

## 2018-03-29 MED ORDER — FENTANYL 2500MCG IN NS 250ML (10MCG/ML) PREMIX INFUSION
25.0000 ug/h | INTRAVENOUS | Status: DC
  Administered 2018-03-30 (×3): 350 ug/h via INTRAVENOUS
  Administered 2018-03-30: 400 ug/h via INTRAVENOUS
  Administered 2018-03-31: 350 ug/h via INTRAVENOUS
  Filled 2018-03-29 (×5): qty 250

## 2018-03-29 MED ORDER — ACETAMINOPHEN 650 MG RE SUPP
650.0000 mg | Freq: Four times a day (QID) | RECTAL | Status: DC | PRN
  Filled 2018-03-29: qty 1

## 2018-03-29 MED ORDER — MIDAZOLAM HCL 2 MG/2ML IJ SOLN: 2.0000 mg | INTRAMUSCULAR | Status: DC | PRN

## 2018-03-29 MED ORDER — DEXTROSE 50 % IV SOLN
INTRAVENOUS | Status: AC
  Administered 2018-03-30: 25 mL
  Filled 2018-03-29: qty 50

## 2018-03-29 MED ORDER — DEXMEDETOMIDINE HCL IN NACL 400 MCG/100ML IV SOLN
0.0000 ug/kg/h | INTRAVENOUS | Status: DC
  Administered 2018-03-29: 1.2 ug/kg/h via INTRAVENOUS
  Administered 2018-03-29: 1 ug/kg/h via INTRAVENOUS
  Filled 2018-03-29: qty 100

## 2018-03-29 MED ORDER — LORAZEPAM 1 MG PO TABS: 1.0000 mg | ORAL_TABLET | Freq: Four times a day (QID) | ORAL | Status: DC | PRN

## 2018-03-29 MED ORDER — SUCCINYLCHOLINE CHLORIDE 20 MG/ML IJ SOLN
60.0000 mg | Freq: Once | INTRAMUSCULAR | Status: AC
  Administered 2018-03-29: 60 mg via INTRAVENOUS
  Filled 2018-03-29: qty 3

## 2018-03-29 MED ORDER — ETOMIDATE 2 MG/ML IV SOLN
15.0000 mg | Freq: Once | INTRAVENOUS | Status: AC
  Administered 2018-03-29: 15 mg via INTRAVENOUS

## 2018-03-29 MED ORDER — MIDAZOLAM HCL 2 MG/2ML IJ SOLN
2.0000 mg | Freq: Once | INTRAMUSCULAR | Status: AC
  Administered 2018-03-29: 2 mg via INTRAVENOUS

## 2018-03-29 MED ORDER — FENTANYL CITRATE (PF) 100 MCG/2ML IJ SOLN: 100.0000 ug | INTRAMUSCULAR | Status: DC | PRN

## 2018-03-29 MED ORDER — LORAZEPAM 2 MG/ML IJ SOLN
1.0000 mg | Freq: Four times a day (QID) | INTRAMUSCULAR | Status: DC | PRN
  Filled 2018-03-29: qty 1

## 2018-03-29 MED ORDER — MIDAZOLAM HCL 2 MG/2ML IJ SOLN
INTRAMUSCULAR | Status: AC
  Filled 2018-03-29: qty 2

## 2018-03-29 MED ORDER — PROPOFOL 1000 MG/100ML IV EMUL
INTRAVENOUS | Status: AC
  Filled 2018-03-29: qty 100

## 2018-03-29 MED ORDER — BISACODYL 10 MG RE SUPP: 10.0000 mg | Freq: Every day | RECTAL | Status: DC | PRN

## 2018-03-29 MED ORDER — SENNOSIDES 8.8 MG/5ML PO SYRP
5.0000 mL | ORAL_SOLUTION | Freq: Two times a day (BID) | ORAL | Status: DC | PRN
  Filled 2018-03-29: qty 5

## 2018-03-29 MED ORDER — SENNOSIDES 8.8 MG/5ML PO SYRP
5.0000 mL | ORAL_SOLUTION | Freq: Two times a day (BID) | ORAL | Status: DC | PRN
  Administered 2018-03-29: 5 mL
  Filled 2018-03-29 (×2): qty 5

## 2018-03-29 MED ORDER — CLONAZEPAM 0.5 MG PO TBDP
3.0000 mg | ORAL_TABLET | Freq: Two times a day (BID) | ORAL | Status: DC
  Administered 2018-03-29: 3 mg
  Filled 2018-03-29: qty 6

## 2018-03-29 MED ORDER — DEXMEDETOMIDINE HCL IN NACL 400 MCG/100ML IV SOLN
0.0000 ug/kg/h | INTRAVENOUS | Status: DC
  Administered 2018-03-29: 1.2 ug/kg/h via INTRAVENOUS
  Administered 2018-03-30: 0.8 ug/kg/h via INTRAVENOUS
  Administered 2018-03-30: 1.2 ug/kg/h via INTRAVENOUS
  Administered 2018-03-30: 0.8 ug/kg/h via INTRAVENOUS
  Administered 2018-03-31: 1 ug/kg/h via INTRAVENOUS
  Administered 2018-03-31: 1.2 ug/kg/h via INTRAVENOUS
  Filled 2018-03-29 (×5): qty 100

## 2018-03-29 MED ORDER — FOLIC ACID 5 MG/ML IJ SOLN
1.0000 mg | Freq: Every day | INTRAMUSCULAR | Status: DC
  Administered 2018-03-29: 1 mg via INTRAVENOUS
  Filled 2018-03-29 (×2): qty 0.2

## 2018-03-29 MED ORDER — SENNA 8.6 MG PO TABS
2.0000 | ORAL_TABLET | Freq: Every day | ORAL | Status: DC
  Administered 2018-03-30 – 2018-03-31 (×2): 17.2 mg
  Filled 2018-03-29 (×3): qty 2

## 2018-03-29 MED ORDER — PROPOFOL 1000 MG/100ML IV EMUL
0.0000 ug/kg/min | INTRAVENOUS | Status: DC
  Administered 2018-03-29: 20 ug/kg/min via INTRAVENOUS
  Administered 2018-03-30: 40 ug/kg/min via INTRAVENOUS
  Administered 2018-03-30: 50 ug/kg/min via INTRAVENOUS
  Administered 2018-03-30: 40 ug/kg/min via INTRAVENOUS
  Administered 2018-03-30: 30 ug/kg/min via INTRAVENOUS
  Administered 2018-03-31: 50 ug/kg/min via INTRAVENOUS
  Filled 2018-03-29 (×5): qty 100

## 2018-03-29 MED ORDER — CLONAZEPAM 0.1 MG/ML ORAL SUSPENSION: 3.0000 mg | Freq: Two times a day (BID) | ORAL | Status: DC

## 2018-03-29 MED ORDER — FENTANYL BOLUS VIA INFUSION
50.0000 ug | INTRAVENOUS | Status: DC | PRN
  Administered 2018-03-29 – 2018-03-31 (×5): 50 ug via INTRAVENOUS
  Filled 2018-03-29: qty 50

## 2018-03-29 MED ORDER — MIDAZOLAM HCL 2 MG/2ML IJ SOLN
2.0000 mg | INTRAMUSCULAR | Status: DC | PRN
  Administered 2018-03-29: 2 mg via INTRAVENOUS
  Filled 2018-03-29: qty 2

## 2018-03-29 MED ORDER — LORAZEPAM 2 MG/ML IJ SOLN: 1.0000 mg | Freq: Two times a day (BID) | INTRAMUSCULAR | Status: DC

## 2018-03-29 MED ORDER — HYDROXYZINE HCL 25 MG PO TABS
25.0000 mg | ORAL_TABLET | Freq: Three times a day (TID) | ORAL | Status: DC
  Administered 2018-03-29 – 2018-04-09 (×27): 25 mg via ORAL
  Filled 2018-03-29 (×35): qty 1

## 2018-03-29 NOTE — Procedures (Signed)
Intubation Procedure Note Lindsay Lara [id number] Aug 23, 2000  Procedure: Intubation Indications: Respiratory insufficiency  Procedure Details Consent: Risks of procedure as well as the alternatives and risks of each were explained to the (patient/caregiver).  Consent for procedure obtained. Time Out: Verified patient identification, verified procedure, site/side was marked, verified correct patient position, special equipment/implants available, medications/allergies/relevent history reviewed, required imaging and test results available.  Performed  She was given 67mg of fentanyl, 118mof etomidate and 6045mf succinylcholine.   Vocal cords visualized with grade II view  Glidescope MAC 3 used  7.5 ETT placed. 23cm at the teeth  Evaluation Hemodynamic Status: Transient hypotension resolved spontaneously; O2 sats: transiently fell during during procedure Patient's Current Condition: stable Complications: No apparent complications Patient did tolerate procedure well. Chest X-ray ordered to verify placement.  CXR: tube position low-repostitioned by withdrawing the ETT 2cm.   Lindsay Lara.D. [facility] After hours pager: [phone]/24/2020

## 2018-03-29 NOTE — Progress Notes (Signed)
Pressed elink button in pt room to advise pt continues to thrash in bed, attempt to self extubated in spite of restraints, mittens and sitter and family present.  Received order to start continuous Propofol gtt.  Will initiate new orders and continue to monitor.

## 2018-03-29 NOTE — Progress Notes (Signed)
Just after being pulled and readjusted, pt sat straight up (90 degrees), and pulled out ETT. This RN called out for help, and O2 was subsequently put on pt. Pt put on 15L [place]. Fleet Contras, RN, turned off Fentanyl and Precedex gtt as well as tube feedings. Dr. Molli Knock paged about current situation. Will continue to closely monitor pt.

## 2018-03-29 NOTE — Progress Notes (Signed)
Collected ABG but sample was venous. Attempted to recollect, RN declined attempt at this time due to extreme pt agitation. Will draw sample once pt is calm.

## 2018-03-29 NOTE — Progress Notes (Signed)
Called elink to advise that pt maxed out on all continuous sedation and all PRNs given.  Pt still thrashing in bed and sitting upright.  Dr. Warrick Parisian advised to give scheduled Vistaril, Seroquel, and Ativan now.  Will administer medications and continue to monitor.

## 2018-03-29 NOTE — Progress Notes (Addendum)
NAME:  Lindsay Lara, MRN:  [id number], DOB:  [birth year], LOS: 8 ADMISSION DATE:  03/21/2018, CONSULTATION DATE:  03/21/2018 REFERRING MD:  Dr. [name] Carl CHIEF COMPLAINT:  Altered Mental Status    Brief History   18 yr old female w/ PMHx ADHD, Anxiety, Bipolar depression,previous suicide attempts,Polysubstance abuse (cocaine, Marijuana,heroin and ETOH) presentsvia EMS after Mom found pt unresponsive. Bradycardic to 40sandhypotensive with SBP in the 40s. Intubated.Given Glucagon.Started on epi gtt. PCCM consulted for Intentional OD (propanolol, xanax and Seroquel)  Significant Hospital Events   1/16>Admission  1/16>Seen by Neurology - MRI for possible lateralizing signs  Consults:  PCCM Neurology   Procedures:  1/16> Intubated 1/16>CVL placed  1/17> chest tube placed 1/18>Bedside bronchoscopy , Therapeutic suctioning  1/21>extubated 1/21>reintubated   Significant Diagnostic Tests:  CT head: Unremarkable  EKG: RBBB, Prolonged QTc 528 EEG: possible right focal cerebral dysfunction. No clear seizure activity. MRI: Negative for acute infarct or mass, Question diffuse cortical edema right hemisphere on axial T2 images.While this could be due to artifact, consider seizure related cortical edema. EEG 1/23: no seizure activity   Micro Data:  1/18 Urine Cx> 1/18 Urinalysis> 1/18 Blood Cx> 1/18 Tracheal Aspirate Cx> Rare GNR, pending sensitivity/cx  1/21 Repeat Resp culture > rare candida   Antimicrobials:  1/18 Zosyn>1/19 1/18 Vancomycin>1/19 1/19 Unasyn> 1/21 1/21 Ampicillin > 1/23 1/23 Unasyn >   Interim history/subjective:  Switched from precedex to propofol last night due to increased agitation.   Objective   Blood pressure (!) 102/39, pulse 87, temperature 97.9 F (36.6 C), temperature source Rectal, resp. rate 18, height 5\' 5"  (1.651 m), weight 57.3 kg, last menstrual period 03/21/2018, SpO2 91 %.    Vent Mode: PRVC FiO2 (%):  [50 %-70 %] 70 % Set Rate:  [18  bmp] 18 bmp Vt Set:  [450 mL] 450 mL PEEP:  [5 cmH20-8 cmH20] 8 cmH20 Plateau Pressure:  [23 cmH20-31 cmH20] 23 cmH20   Intake/Output Summary (Last 24 hours) at 03/29/2018 2060 Last data filed at 03/29/2018 0600 Gross per 24 hour  Intake 2994.48 ml  Output 2090 ml  Net 904.48 ml   Filed Weights   03/26/18 0211 03/27/18 0411 03/28/18 0446  Weight: 58.2 kg 55.6 kg 57.3 kg    Examination: General: intubated, sedated  HENT: ETT in place, OGT in place Lungs: mild wheezing and crackles Cardiovascular: split s2, no m/r/g Abdomen: BS+, Soft, NTTP, mildly distended  Extremities: Old scars on left forearm, trace edema  Neuro: sedated, not following commands  GU: Foley in place  Resolved Hospital Problem list   Left apical Pneumothorax    Assessment & Plan:  18 yr old female w/ PMHx ADHD, Anxiety, Bipolar depression,previous suicide attempts,Polysubstance abuse (cocaine, Marijuana,heroin and ETOH) presentsfollowing intentional drug overdose with Propranolol and Seroquel. Utox positive for benzos and THC.   Acute respiratory failure secondary to respiratory depression from intentional overdose  HAP/Aspiration Pneumonia  Pulmonary Edema  Chest x-ray with worsening patchy diffuse opacification and FiO2 requirements increased to 70% on Peep 8. Leukocytosis increased. Likely aspiration with recent extubation worsening her pneumonia. Possibly mild ARDs as well, although ABG yesterday demonstrated sufficient Pao2/fio2 ratio.  -optimize medications to decrease fluid administration  -lasix  -pressors prn  -WUA/SBT daily -Albuterol prn, Continue chest physiotherapy, VAP  -cont. unasyn  -am labs, cxr  Alcohol use disorder Marijuana use disorder Opioid use disorder Cocaine use disorder Increased agitation overnight, switched from precedex to propofol. Discussed with mother and nursing this morning and felt that she was  mentating more and following commands on precedex. Currently  completed sedated. Switched back to assess mentation, patient now communicating and following commands.  -cont precedex, fentanyl prn  -cont. Atarax q6h, klonipin scheduled q12h -Continue thiamine and folic acid -Nicotine patch in place  High risk behavior Labial Swelling Foley cath switched to non-latex yesterday. Discussed ED note with mother concerning possible vaginal trauma. Vaginal exam done in ED without acute findings or injury.  -Known history of genital herpes -Continue acyclovir 400 mg twice daily.  ADHD, bipolar disorder depression, anxiety disorder, previous suicidal attempt -Seroquel 300mg  daily  -Once stable, she will warrant psychiatry evaluation.  Constipation Last BM 1/20 per chart.   - increased senokot - glycerin suppository prn   Acute Encephalopathy at Admission Repeat EEG yesterday per neurology showed no seizure activite.  -MRI planned after extubation   Best practice:  Diet:NPO Pain/Anxiety/Delirium protocol (if indicated):Propofol, fentanyl VAP protocol (if indicated):yes DVT prophylaxis:Lovenox GI prophylaxis:PPI Glucose control:none Mobility: intubated  Code Status:FULL Family Communication: mom at bedside Disposition:ICU   Labs   CBC: Recent Labs  Lab 03/23/18 2140  03/25/18 0404 03/26/18 0841 03/27/18 0408 03/28/18 0627 03/29/18 0441  WBC 13.9*   < > 12.4* 13.3* 11.9* 12.6* 14.7*  NEUTROABS 10.7*  --   --   --   --   --   --   HGB 9.9*   < > 9.9* 9.7* 9.2* 9.5* 9.4*  HCT 31.2*   < > 30.5* 29.4* 28.0* 30.0* 30.7*  MCV 94.0   < > 91.6 92.7 91.5 92.0 93.9  PLT 132*   < > 163 178 166 159 147*   < > = values in this interval not displayed.    Basic Metabolic Panel: Recent Labs  Lab 03/25/18 0404 03/26/18 0841 03/26/18 1807 03/27/18 0408 03/28/18 0430 03/29/18 0441  NA 141 143  --  142 141 141  K 3.5 3.4*  --  3.2* 3.9 4.2  CL 109 110  --  111 114* 113*  CO2 25 24  --  25 24 25   GLUCOSE 116* 67*  --  88 102* 102*    BUN 6 <5*  --  <5* 6 7  CREATININE 0.88 0.77  --  0.96 0.83 0.97  CALCIUM 7.7* 7.5*  --  7.6* 7.5* 7.6*  MG 1.6* 1.6* 2.1 1.8 1.9 1.8  PHOS 2.3* 3.8 3.4 3.2 3.3  --    GFR: Estimated Creatinine Clearance: 83.9 mL/min (by C-G formula based on SCr of 0.97 mg/dL). Recent Labs  Lab 03/22/18 0959  03/23/18 2149 03/23/18 2353  03/26/18 0841 03/27/18 0408 03/28/18 0627 03/29/18 0441  WBC  --    < >  --   --    < > 13.3* 11.9* 12.6* 14.7*  LATICACIDVEN 1.4  --  1.2 1.2  --   --   --   --   --    < > = values in this interval not displayed.    Liver Function Tests: Recent Labs  Lab 03/23/18 0626 03/28/18 0430  AST 361* 22  ALT 370* 76*  ALKPHOS 61 71  BILITOT 1.1 0.4  PROT 4.3* 4.3*  ALBUMIN 2.4* 1.5*   No results for input(s): LIPASE, AMYLASE in the last 168 hours. No results for input(s): AMMONIA in the last 168 hours.  ABG    Component Value Date/Time   PHART 7.351 03/28/2018 0420   PCO2ART 44.1 03/28/2018 0420   PO2ART 85.0 03/28/2018 0420   HCO3 23.8 03/28/2018 0420  TCO2 26 03/23/2018 0213   ACIDBASEDEF 1.0 03/28/2018 0420   O2SAT 96.2 03/28/2018 0420     Coagulation Profile: No results for input(s): INR, PROTIME in the last 168 hours.  Cardiac Enzymes: Recent Labs  Lab 03/25/18 1220  TROPONINI 0.05*    HbA1C: Hgb A1c MFr Bld  Date/Time Value Ref Range Status  02/10/2018 06:55 AM 5.0 4.8 - 5.6 % Final    Comment:    (NOTE) Pre diabetes:          5.7%-6.4% Diabetes:              >6.4% Glycemic control for   <7.0% adults with diabetes   09/17/2015 06:42 PM 5.2 4.8 - 5.6 % Final    Comment:    (NOTE)         Pre-diabetes: 5.7 - 6.4         Diabetes: >6.4         Glycemic control for adults with diabetes: <7.0     CBG: Recent Labs  Lab 03/28/18 1147 03/28/18 1453 03/28/18 1912 03/28/18 2309 03/29/18 0331  GLUCAP 101* 82 85 77 104*     Addalynn Kumari A, DO 03/29/2018, 6:14 AM Pager: [phone]

## 2018-03-29 NOTE — Progress Notes (Signed)
EEG was normal.   The patient should have a repeat MRI brain when she is extubated and stable.   Neurology will sign off for now. Please call us when repeat MRI brain is available.   Electronically signed: Dr. Caryl Pina

## 2018-03-29 NOTE — Progress Notes (Signed)
Nutrition Follow-up  DOCUMENTATION CODES:   Not applicable  INTERVENTION:    Continue Vital AF 1.2 at 60 ml/h via OGT to provide 1728 kcal, 108 gm protein, 1168 ml free water daily.  NUTRITION DIAGNOSIS:   Inadequate oral intake related to inability to eat as evidenced by NPO status.  Ongoing  GOAL:   Patient will meet greater than or equal to 90% of their needs  Met with TF  MONITOR:   Vent status, Labs, TF tolerance, I & O's  ASSESSMENT:   17 year old female with past medical hx of ADHD, anxiety, bipolar depression, previous suicide attempts, polysubstance abuse (cocaine, marijuana, heroin and alcohol abuse). She presented to the ED via EMS after mom found patient unresponsive. Bradycardic and hypotensive. Intubated. Given glucagon. Started on epi gtt. PCCM consulted for intentional OD (propanolol, xanax and Seroquel).  Patient remains intubated on ventilator support. MV: 7.8 L/min Temp (24hrs), Avg:98.8 F (37.1 C), Min:97.9 F (36.6 C), Max:100.2 F (37.9 C)  MRI negative for acute infarct or mass. Plans to repeat MRI after extubation. I/O +8.3 L since admission  Patient is currently receiving Vital AF 1.2 via OGT at 60 ml/h providing 1728 kcals, 108 gm protein, 1168 ml free water daily. Tolerating TF without difficulty.   Labs reviewed.  Medications reviewed and include folic acid, MVI, thiamine, levophed. Propofol was started last night, currently off.   Diet Order:   Diet Order    None      EDUCATION NEEDS:   No education needs have been identified at this time  Skin:  Skin Assessment: Reviewed RN Assessment  Last BM:  1/20  Height:   Ht Readings from Last 1 Encounters:  03/21/18 5' 5"  (1.651 m) (61 %, Z= 0.27)*   * Growth percentiles are based on CDC (Girls, 2-20 Years) data.    Weight:   Wt Readings from Last 1 Encounters:  03/29/18 58.9 kg (53 %, Z= 0.07)*   * Growth percentiles are based on CDC (Girls, 2-20 Years) data.    Ideal  Body Weight:  56.82 kg  BMI:  Body mass index is 21.61 kg/m.  Estimated Nutritional Needs:   Kcal:  1670  Protein:  80-95 gm  Fluid:  1.8-2 L    Molli Barrows, RD, LDN, [place] Pager [phone] After Hours Pager [phone]

## 2018-03-30 ENCOUNTER — Inpatient Hospital Stay (HOSPITAL_COMMUNITY): Payer: PRIVATE HEALTH INSURANCE

## 2018-03-30 LAB — GLUCOSE, CAPILLARY
GLUCOSE-CAPILLARY: 69 mg/dL — AB (ref 70–99)
GLUCOSE-CAPILLARY: 87 mg/dL (ref 70–99)
Glucose-Capillary: 135 mg/dL — ABNORMAL HIGH (ref 70–99)
Glucose-Capillary: 58 mg/dL — ABNORMAL LOW (ref 70–99)
Glucose-Capillary: 86 mg/dL (ref 70–99)
Glucose-Capillary: 87 mg/dL (ref 70–99)
Glucose-Capillary: 88 mg/dL (ref 70–99)
Glucose-Capillary: 90 mg/dL (ref 70–99)
Glucose-Capillary: 91 mg/dL (ref 70–99)

## 2018-03-30 LAB — MAGNESIUM
Magnesium: 1.5 mg/dL — ABNORMAL LOW (ref 1.7–2.4)
Magnesium: 2.5 mg/dL — ABNORMAL HIGH (ref 1.7–2.4)
Magnesium: 3 mg/dL — ABNORMAL HIGH (ref 1.7–2.4)

## 2018-03-30 LAB — CBC WITH DIFFERENTIAL/PLATELET
Abs Immature Granulocytes: 0.15 10*3/uL — ABNORMAL HIGH (ref 0.00–0.07)
BASOS ABS: 0 10*3/uL (ref 0.0–0.1)
Basophils Relative: 0 %
EOS PCT: 3 %
Eosinophils Absolute: 0.6 10*3/uL — ABNORMAL HIGH (ref 0.0–0.5)
HCT: 30.6 % — ABNORMAL LOW (ref 36.0–46.0)
Hemoglobin: 9.5 g/dL — ABNORMAL LOW (ref 12.0–15.0)
Immature Granulocytes: 1 %
LYMPHS ABS: 2.1 10*3/uL (ref 0.7–4.0)
Lymphocytes Relative: 12 %
MCH: 28.7 pg (ref 26.0–34.0)
MCHC: 31 g/dL (ref 30.0–36.0)
MCV: 92.4 fL (ref 80.0–100.0)
Monocytes Absolute: 0.6 10*3/uL (ref 0.1–1.0)
Monocytes Relative: 3 %
Neutro Abs: 14.3 10*3/uL — ABNORMAL HIGH (ref 1.7–7.7)
Neutrophils Relative %: 81 %
Platelets: 152 10*3/uL (ref 150–400)
RBC: 3.31 MIL/uL — ABNORMAL LOW (ref 3.87–5.11)
RDW: 14.6 % (ref 11.5–15.5)
WBC: 17.8 10*3/uL — ABNORMAL HIGH (ref 4.0–10.5)
nRBC: 0.1 % (ref 0.0–0.2)

## 2018-03-30 LAB — BLOOD GAS, ARTERIAL
Acid-Base Excess: 3.8 mmol/L — ABNORMAL HIGH (ref 0.0–2.0)
Bicarbonate: 28.2 mmol/L — ABNORMAL HIGH (ref 20.0–28.0)
Drawn by: 414221
FIO2: 100
MECHVT: 450 mL
O2 Saturation: 92.1 %
PEEP: 10 cmH2O
Patient temperature: 98.6
RATE: 18 resp/min
pCO2 arterial: 45.7 mmHg (ref 32.0–48.0)
pH, Arterial: 7.406 (ref 7.350–7.450)
pO2, Arterial: 64.7 mmHg — ABNORMAL LOW (ref 83.0–108.0)

## 2018-03-30 LAB — BASIC METABOLIC PANEL
Anion gap: 12 (ref 5–15)
BUN: 7 mg/dL (ref 6–20)
CO2: 26 mmol/L (ref 22–32)
Calcium: 7.1 mg/dL — ABNORMAL LOW (ref 8.9–10.3)
Chloride: 107 mmol/L (ref 98–111)
Creatinine, Ser: 0.96 mg/dL (ref 0.44–1.00)
GFR calc Af Amer: >60 mL/min (ref >60)
Glucose, Bld: 84 mg/dL (ref 70–99)
Potassium: 3.2 mmol/L — ABNORMAL LOW (ref 3.5–5.1)
Sodium: 145 mmol/L (ref 135–145)

## 2018-03-30 LAB — PHOSPHORUS
Phosphorus: 4.4 mg/dL (ref 2.5–4.6)
Phosphorus: 5.2 mg/dL — ABNORMAL HIGH (ref 2.5–4.6)
Phosphorus: 4.6 mg/dL (ref 2.5–4.6)

## 2018-03-30 MED ORDER — VITAMIN B-1 100 MG PO TABS
100.0000 mg | ORAL_TABLET | Freq: Every day | ORAL | Status: DC
  Administered 2018-03-30 – 2018-04-11 (×10): 100 mg
  Filled 2018-03-30 (×13): qty 1

## 2018-03-30 MED ORDER — ADULT MULTIVITAMIN W/MINERALS CH
1.0000 | ORAL_TABLET | Freq: Every day | ORAL | Status: DC
  Administered 2018-03-30: 1
  Filled 2018-03-30 (×2): qty 1

## 2018-03-30 MED ORDER — ACETAMINOPHEN 160 MG/5ML PO SOLN
650.0000 mg | Freq: Three times a day (TID) | ORAL | Status: DC | PRN
  Administered 2018-04-02 – 2018-04-16 (×7): 650 mg
  Filled 2018-03-30 (×7): qty 20.3

## 2018-03-30 MED ORDER — FOLIC ACID 1 MG PO TABS
1.0000 mg | ORAL_TABLET | Freq: Every day | ORAL | Status: DC
  Administered 2018-03-30 – 2018-04-02 (×4): 1 mg
  Filled 2018-03-30 (×5): qty 1

## 2018-03-30 MED ORDER — MIDAZOLAM HCL 2 MG/2ML IJ SOLN
1.0000 mg | INTRAMUSCULAR | Status: DC | PRN
  Administered 2018-03-30 – 2018-03-31 (×3): 2 mg via INTRAVENOUS
  Filled 2018-03-30 (×3): qty 2

## 2018-03-30 MED ORDER — DEXTROSE 50 % IV SOLN
INTRAVENOUS | Status: AC
  Administered 2018-03-30: 25 mL
  Filled 2018-03-30: qty 50

## 2018-03-30 MED ORDER — PRO-STAT SUGAR FREE PO LIQD
30.0000 mL | Freq: Two times a day (BID) | ORAL | Status: DC
  Administered 2018-03-30: 30 mL
  Filled 2018-03-30: qty 30

## 2018-03-30 MED ORDER — SODIUM CHLORIDE 0.9 % IV SOLN
6.0000 g | Freq: Once | INTRAVENOUS | Status: AC
  Administered 2018-03-30: 6 g via INTRAVENOUS
  Filled 2018-03-30: qty 10

## 2018-03-30 MED ORDER — VITAL AF 1.2 CAL PO LIQD
1000.0000 mL | ORAL | Status: DC
  Administered 2018-03-31 – 2018-04-01 (×2): 1000 mL

## 2018-03-30 MED ORDER — VITAL HIGH PROTEIN PO LIQD
1000.0000 mL | ORAL | Status: DC
  Administered 2018-03-30: 1000 mL

## 2018-03-30 MED ORDER — POTASSIUM CHLORIDE 20 MEQ/15ML (10%) PO SOLN
30.0000 meq | ORAL | Status: AC
  Administered 2018-03-30 (×2): 30 meq
  Filled 2018-03-30 (×2): qty 30

## 2018-03-30 MED ORDER — NICOTINE 7 MG/24HR TD PT24
7.0000 mg | MEDICATED_PATCH | Freq: Every day | TRANSDERMAL | Status: DC
  Administered 2018-03-30 – 2018-03-31 (×2): 7 mg via TRANSDERMAL
  Filled 2018-03-30 (×2): qty 1

## 2018-03-30 MED ORDER — CHLORHEXIDINE GLUCONATE CLOTH 2 % EX PADS
6.0000 | MEDICATED_PAD | Freq: Every day | CUTANEOUS | Status: DC
  Administered 2018-03-30 – 2018-04-10 (×12): 6 via TOPICAL

## 2018-03-30 MED ORDER — PANTOPRAZOLE SODIUM 40 MG PO PACK
40.0000 mg | PACK | ORAL | Status: DC
  Administered 2018-03-30 – 2018-04-03 (×5): 40 mg
  Filled 2018-03-30 (×5): qty 20

## 2018-03-30 NOTE — Progress Notes (Signed)
[facility] ADULT ICU REPLACEMENT PROTOCOL FOR AM LAB REPLACEMENT ONLY  The patient does apply for the [facility] Adult ICU Electrolyte Replacment Protocol based on the criteria listed below:   1. Is GFR >/= 40 ml/min? Yes.    Patient's GFR today is >60 2. Is urine output >/= 0.5 ml/kg/hr for the last 6 hours? Yes.   Patient's UOP is 1.3 ml/kg/hr 3. Is BUN < 60 mg/dL? Yes.    Patient's BUN today is 7 4. Abnormal electrolyte Mg 1.5, K 3.2 5. Ordered repletion with: per protocol 6. If a panic level lab has been reported, has the CCM MD in charge been notified? Yes.  .   Physician:  E Deterding  [place] 03/30/2018 6:26 AM

## 2018-03-30 NOTE — Progress Notes (Signed)
Nutrition Follow-up  DOCUMENTATION CODES:  Not applicable  INTERVENTION:  Change TF to Vital AF 1.2 at goal rate of 55 ml/h (1320 ml per day) to provide 1584 kcals (+280 from Diprivan), 99 gm protein, 1071 ml free water daily.  Fluid management per MD  NUTRITION DIAGNOSIS:  Inadequate oral intake related to inability to eat as evidenced by NPO status.  Ongoing  GOAL:  Patient will meet greater than or equal to 90% of their needs  Met with TF  MONITOR:  Vent status, Labs, TF tolerance, I & O's  REASON FOR ASSESSMENT:  Consult Enteral/tube feeding initiation and management  ASSESSMENT:  18 year old female with past medical hx of ADHD, anxiety, bipolar depression, previous suicide attempts, polysubstance abuse (cocaine, marijuana, heroin and alcohol abuse). She presented to the ED via EMS after mom found patient unresponsive. Bradycardic and hypotensive. Intubated. Given glucagon. Started on epi gtt. PCCM consulted for intentional OD (propanolol, xanax and Seroquel).  Pt had been receving Vital AF 1.2 at 60 ml/h via OGT to provide 1728 kcal, 108 gm protein, 1168 ml free water daily.   Pt self extubated last night and quickly required reintubation. Placed back on sedation. TF protocol restarted and currently pt is on VHP @ 40 w/ ps x2. Noted pt also spiked a fever. Noted she also has newly  Documented pressure ulcer as of 1/23- Stage II to Right, upper face  Today, RN notes weaning will not be re attempted for next couple days.   RN reports patient w/ recent BM. Pt abdomen soft, non distended.   Patient is currently intubated on ventilator support MV: 11.1 L/min Temp (24hrs), Avg:100.1 F (37.8 C), Min:98.3 F (36.8 C), Max:102.3 F (39.1 C) Propofol: 10.6 ml/hr =280 kcals/day  Labs: WBC: 17.6, Phos: 4.4->5.5, Mag1.5->2.5, K:3.2, BGs: 87-107 Meds: Acyclovir, folate, MVI with min, Nystatin, PPI, Seroquel, Senokot, thiamin Sedation/analgesia: Fentanyl, Precedex, propofol   Other infusions: IV abx, IVF, Mag,   Recent Labs  Lab 03/28/18 0430 03/29/18 0441 03/30/18 0434 03/30/18 0841  NA 141 141 145  --   K 3.9 4.2 3.2*  --   CL 114* 113* 107  --   CO2 24 25 26   --   BUN 6 7 7   --   CREATININE 0.83 0.97 0.96  --   CALCIUM 7.5* 7.6* 7.1*  --   MG 1.9 1.8 1.5* 2.5*  PHOS 3.3  --  4.4 5.2*  GLUCOSE 102* 102* 84  --    Diet Order:   Diet Order            Diet NPO time specified  Diet effective now              EDUCATION NEEDS:  No education needs have been identified at this time  Skin:  PU stage II , Right upper face  Last BM:  1/20  Height:  Ht Readings from Last 1 Encounters:  03/21/18 5' 5"  (1.651 m) (61 %, Z= 0.27)*   * Growth percentiles are based on CDC (Girls, 2-20 Years) data.   Weight:  Wt Readings from Last 1 Encounters:  03/30/18 58.9 kg (53 %, Z= 0.07)*   * Growth percentiles are based on CDC (Girls, 2-20 Years) data.  Admit/dry weight: 119.5 lbs (54.3kg)  Ideal Body Weight:  56.82 kg  BMI:  Body mass index is 21.61 kg/m.  Estimated Nutritional Needs:  Kcal:  1930 (PSU 2003b) Protein:  87-103 (1.6-1.9g/kg bw) Fluid:  Per MD fluid goals  Burtis Junes RD, LDN, CNSC Clinical Nutrition Available Tues-Sat via Pager: [phone] 03/30/2018 10:25 AM

## 2018-03-30 NOTE — Progress Notes (Signed)
NAME:  Rico AlaMadison E Hsia, MRN:  [id number], DOB:  February 06, 2001, LOS: 9 ADMISSION DATE:  03/21/2018, CONSULTATION DATE:  03/21/2018 REFERRING MD:  Dr. [name] CarlScheider CHIEF COMPLAINT:  Altered Mental Status    Brief History   18 yr old female w/ PMHx ADHD, Anxiety, Bipolar depression,previous suicide attempts,Polysubstance abuse (cocaine, Marijuana,heroin and ETOH) presentsvia EMS after Mom found pt unresponsive. Bradycardic to 40sandhypotensive with SBP in the 40s. Intubated.Given Glucagon.Started on epi gtt. PCCM consulted for Intentional OD (propanolol, xanax and Seroquel)  Significant Hospital Events   1/16>Admission  1/16>Seen by Neurology - MRI for possible lateralizing signs  Consults:  PCCM Neurology   Procedures:  1/16> Intubated 1/16>CVL placed  1/17> chest tube placed 1/18>Bedside bronchoscopy , Therapeutic suctioning  1/21>extubated 1/21>reintubated   Significant Diagnostic Tests:  CT head: Unremarkable  EKG: RBBB, Prolonged QTc 528 EEG: possible right focal cerebral dysfunction. No clear seizure activity. MRI: Negative for acute infarct or mass, Question diffuse cortical edema right hemisphere on axial T2 images.While this could be due to artifact, consider seizure related cortical edema. EEG 1/23: no seizure activity   Micro Data:  1/18 Urine Cx> 1/18 Urinalysis> 1/18 Blood Cx> 1/18 Tracheal Aspirate Cx> Rare GNR, pending sensitivity/cx  1/21 Repeat Resp culture > rare candida  1/24 Sputum > 1/25 Blood >  Antimicrobials:  1/18 Zosyn>1/19 1/18 Vancomycin>1/19 1/19 Unasyn> 1/21 1/21 Ampicillin > 1/23 1/23 Unasyn >   Interim history/subjective:  reintubated last night after self extubation.  Objective   Blood pressure (!) 96/45, pulse 86, temperature 100.1 F (37.8 C), temperature source Rectal, resp. rate 20, height 5\' 5"  (1.651 m), weight 58.9 kg, last menstrual period 03/21/2018, SpO2 94 %.    Vent Mode: PRVC FiO2 (%):  [50 %-100 %] 100 % Set Rate:   [18 bmp] 18 bmp Vt Set:  [450 mL] 450 mL PEEP:  [8 cmH20-10 cmH20] 10 cmH20 Plateau Pressure:  [25 cmH20-33 cmH20] 30 cmH20   Intake/Output Summary (Last 24 hours) at 03/30/2018 0730 Last data filed at 03/30/2018 0600 Gross per 24 hour  Intake 2427 ml  Output 4700 ml  Net -2273 ml   Filed Weights   03/28/18 0446 03/29/18 0500 03/30/18 0500  Weight: 57.3 kg 58.9 kg 58.9 kg    Examination:  General - sedated Eyes - pupils reactive ENT - ETT in place Cardiac - regular rate/rhythm, no murmur Chest - b/l rales Abdomen - soft, non tender, + bowel sounds GU - no lesions noted Extremities - no cyanosis, clubbing, or edema Skin - no rashes Neuro - RASS -3  CXR - b/l ASD     Resolved Hospital Problem list   Left apical Pneumothorax    Assessment & Plan:  18 yr old female w/ PMHx ADHD, Anxiety, Bipolar depression,previous suicide attempts,Polysubstance abuse (cocaine, Marijuana,heroin and ETOH) presentsfollowing intentional drug overdose with Propranolol and Seroquel. Utox positive for benzos and THC.   Acute respiratory failure secondary to respiratory depression from intentional overdose  HAP/Aspiration Pneumonia  Pulmonary Edema  Plan - adjust PEEP,FiO2 to keep SpO2 90 to 95% - f/u CXR - prn BDs - continue unasyn - f/u culture results  Acute Encephalopathy at Admission Alcohol use disorder Marijuana use disorder Opioid use disorder Cocaine use disorder ADHD, bipolar disorder depression, anxiety disorder, previous suicidal attempt Plan - continue atarax, seroquel - RASS goal -2 - continue precedex, diprivan, fentanyl - prn versed  High risk behavior Labial Swelling Plan - continue acyclovir  - Known history of genital herpes  Constipation Plan -  bowel regimen  Best practice:  Diet:tube feeds DVT prophylaxis:Lovenox GI prophylaxis:PPI Mobility: bed rest Code Status:FULL Family Communication: updated mother at bedside  Labs   CBC: Recent  Labs  Lab 03/23/18 2140  03/26/18 0841 03/27/18 0408 03/28/18 0627 03/29/18 0441 03/30/18 0434  WBC 13.9*   < > 13.3* 11.9* 12.6* 14.7* 17.8*  NEUTROABS 10.7*  --   --   --   --   --  14.3*  HGB 9.9*   < > 9.7* 9.2* 9.5* 9.4* 9.5*  HCT 31.2*   < > 29.4* 28.0* 30.0* 30.7* 30.6*  MCV 94.0   < > 92.7 91.5 92.0 93.9 92.4  PLT 132*   < > 178 166 159 147* 152   < > = values in this interval not displayed.    Basic Metabolic Panel: Recent Labs  Lab 03/26/18 0841 03/26/18 1807 03/27/18 0408 03/28/18 0430 03/29/18 0441 03/30/18 0434  NA 143  --  142 141 141 145  K 3.4*  --  3.2* 3.9 4.2 3.2*  CL 110  --  111 114* 113* 107  CO2 24  --  25 24 25 26   GLUCOSE 67*  --  88 102* 102* 84  BUN <5*  --  <5* 6 7 7   CREATININE 0.77  --  0.96 0.83 0.97 0.96  CALCIUM 7.5*  --  7.6* 7.5* 7.6* 7.1*  MG 1.6* 2.1 1.8 1.9 1.8 1.5*  PHOS 3.8 3.4 3.2 3.3  --  4.4   GFR: Estimated Creatinine Clearance: 84.8 mL/min (by C-G formula based on SCr of 0.96 mg/dL). Recent Labs  Lab 03/23/18 2149 03/23/18 2353  03/27/18 0408 03/28/18 0627 03/29/18 0441 03/30/18 0434  WBC  --   --    < > 11.9* 12.6* 14.7* 17.8*  LATICACIDVEN 1.2 1.2  --   --   --   --   --    < > = values in this interval not displayed.    Liver Function Tests: Recent Labs  Lab 03/28/18 0430  AST 22  ALT 76*  ALKPHOS 71  BILITOT 0.4  PROT 4.3*  ALBUMIN 1.5*   No results for input(s): LIPASE, AMYLASE in the last 168 hours. No results for input(s): AMMONIA in the last 168 hours.  ABG    Component Value Date/Time   PHART 7.406 03/30/2018 0400   PCO2ART 45.7 03/30/2018 0400   PO2ART 64.7 (L) 03/30/2018 0400   HCO3 28.2 (H) 03/30/2018 0400   TCO2 26 03/23/2018 0213   ACIDBASEDEF 1.0 03/28/2018 0420   O2SAT 92.1 03/30/2018 0400     Coagulation Profile: No results for input(s): INR, PROTIME in the last 168 hours.  Cardiac Enzymes: Recent Labs  Lab 03/25/18 1220  TROPONINI 0.05*    HbA1C: Hgb A1c MFr Bld    Date/Time Value Ref Range Status  02/10/2018 06:55 AM 5.0 4.8 - 5.6 % Final    Comment:    (NOTE) Pre diabetes:          5.7%-6.4% Diabetes:              >6.4% Glycemic control for   <7.0% adults with diabetes   09/17/2015 06:42 PM 5.2 4.8 - 5.6 % Final    Comment:    (NOTE)         Pre-diabetes: 5.7 - 6.4         Diabetes: >6.4         Glycemic control for adults with diabetes: <7.0  CBG: Recent Labs  Lab 03/29/18 1541 03/29/18 2352 03/30/18 0030 03/30/18 0050 03/30/18 0424  GLUCAP 92 58* 69* 135* 86     CC time 32 minutes  Coralyn Helling, MD [facility] Pulmonary/Critical Care 03/30/2018, 7:36 AM

## 2018-03-30 NOTE — Progress Notes (Signed)
Pulled ETT from 25 cm to 23 cm per Xray results and MD Isabella Bowens.

## 2018-03-31 ENCOUNTER — Inpatient Hospital Stay (HOSPITAL_COMMUNITY): Payer: PRIVATE HEALTH INSURANCE

## 2018-03-31 LAB — GLUCOSE, CAPILLARY
GLUCOSE-CAPILLARY: 90 mg/dL (ref 70–99)
GLUCOSE-CAPILLARY: 115 mg/dL — AB (ref 70–99)
Glucose-Capillary: 87 mg/dL (ref 70–99)
Glucose-Capillary: 94 mg/dL (ref 70–99)
Glucose-Capillary: 104 mg/dL — ABNORMAL HIGH (ref 70–99)
Glucose-Capillary: 108 mg/dL — ABNORMAL HIGH (ref 70–99)

## 2018-03-31 LAB — MAGNESIUM
Magnesium: 2.5 mg/dL — ABNORMAL HIGH (ref 1.7–2.4)
Magnesium: 1.8 mg/dL (ref 1.7–2.4)

## 2018-03-31 LAB — POCT I-STAT 7, (LYTES, BLD GAS, ICA,H+H)
Acid-Base Excess: 4 mmol/L — ABNORMAL HIGH (ref 0.0–2.0)
Acid-Base Excess: 7 mmol/L — ABNORMAL HIGH (ref 0.0–2.0)
Bicarbonate: 32.9 mmol/L — ABNORMAL HIGH (ref 20.0–28.0)
Bicarbonate: 33.6 mmol/L — ABNORMAL HIGH (ref 20.0–28.0)
Calcium, Ion: 1.13 mmol/L — ABNORMAL LOW (ref 1.15–1.40)
Calcium, Ion: 1.13 mmol/L — ABNORMAL LOW (ref 1.15–1.40)
HCT: 28 % — ABNORMAL LOW (ref 36.0–46.0)
HCT: 26 % — ABNORMAL LOW (ref 36.0–46.0)
HEMOGLOBIN: 9.5 g/dL — AB (ref 12.0–15.0)
Hemoglobin: 8.8 g/dL — ABNORMAL LOW (ref 12.0–15.0)
O2 Saturation: 97 %
O2 Saturation: 100 %
Potassium: 4 mmol/L (ref 3.5–5.1)
Potassium: 4 mmol/L (ref 3.5–5.1)
Sodium: 142 mmol/L (ref 135–145)
Sodium: 142 mmol/L (ref 135–145)
TCO2: 35 mmol/L — AB (ref 22–32)
TCO2: 35 mmol/L — ABNORMAL HIGH (ref 22–32)
pCO2 arterial: 73.3 mmHg (ref 32.0–48.0)
pCO2 arterial: 59.1 mmHg — ABNORMAL HIGH (ref 32.0–48.0)
pH, Arterial: 7.261 — ABNORMAL LOW (ref 7.350–7.450)
pH, Arterial: 7.364 (ref 7.350–7.450)
pO2, Arterial: 104 mmHg (ref 83.0–108.0)
pO2, Arterial: 188 mmHg — ABNORMAL HIGH (ref 83.0–108.0)

## 2018-03-31 LAB — BASIC METABOLIC PANEL
Anion gap: 8 (ref 5–15)
BUN: 14 mg/dL (ref 6–20)
CO2: 27 mmol/L (ref 22–32)
Calcium: 7.4 mg/dL — ABNORMAL LOW (ref 8.9–10.3)
Chloride: 109 mmol/L (ref 98–111)
Creatinine, Ser: 0.71 mg/dL (ref 0.44–1.00)
GFR calc Af Amer: >60 mL/min (ref >60)
GFR calc non Af Amer: >60 mL/min (ref >60)
GLUCOSE: 101 mg/dL — AB (ref 70–99)
Potassium: 4.5 mmol/L (ref 3.5–5.1)
Sodium: 144 mmol/L (ref 135–145)

## 2018-03-31 LAB — CBC
HEMATOCRIT: 27.5 % — AB (ref 36.0–46.0)
Hemoglobin: 8.3 g/dL — ABNORMAL LOW (ref 12.0–15.0)
MCH: 29 pg (ref 26.0–34.0)
MCHC: 30.2 g/dL (ref 30.0–36.0)
MCV: 96.2 fL (ref 80.0–100.0)
Platelets: 186 10*3/uL (ref 150–400)
RBC: 2.86 MIL/uL — ABNORMAL LOW (ref 3.87–5.11)
RDW: 14.8 % (ref 11.5–15.5)
WBC: 14.5 10*3/uL — AB (ref 4.0–10.5)
nRBC: 0 % (ref 0.0–0.2)

## 2018-03-31 LAB — PHOSPHORUS
Phosphorus: 4 mg/dL (ref 2.5–4.6)
Phosphorus: 3.8 mg/dL (ref 2.5–4.6)

## 2018-03-31 MED ORDER — SODIUM CHLORIDE 0.9 % IV SOLN
0.0000 mg/h | INTRAVENOUS | Status: DC
  Administered 2018-03-31: 2 mg/h via INTRAVENOUS
  Administered 2018-03-31 – 2018-04-01 (×2): 6 mg/h via INTRAVENOUS
  Administered 2018-04-01: 7 mg/h via INTRAVENOUS
  Administered 2018-04-01 – 2018-04-02 (×2): 8 mg/h via INTRAVENOUS
  Administered 2018-04-02: 10 mg/h via INTRAVENOUS
  Administered 2018-04-02 (×2): 8 mg/h via INTRAVENOUS
  Administered 2018-04-02 – 2018-04-03 (×2): 10 mg/h via INTRAVENOUS
  Administered 2018-04-03 – 2018-04-05 (×6): 6 mg/h via INTRAVENOUS
  Administered 2018-04-05: 2 mg/h via INTRAVENOUS
  Administered 2018-04-06: 4 mg/h via INTRAVENOUS
  Administered 2018-04-08: 6 mg/h via INTRAVENOUS
  Filled 2018-03-31 (×22): qty 5

## 2018-03-31 MED ORDER — VANCOMYCIN HCL IN DEXTROSE 750-5 MG/150ML-% IV SOLN
750.0000 mg | Freq: Two times a day (BID) | INTRAVENOUS | Status: DC
  Administered 2018-04-01 – 2018-04-02 (×3): 750 mg via INTRAVENOUS
  Filled 2018-03-31 (×4): qty 150

## 2018-03-31 MED ORDER — ARTIFICIAL TEARS OPHTHALMIC OINT
1.0000 "application " | TOPICAL_OINTMENT | Freq: Three times a day (TID) | OPHTHALMIC | Status: DC
  Administered 2018-03-31 – 2018-04-02 (×7): 1 via OPHTHALMIC

## 2018-03-31 MED ORDER — METOPROLOL TARTRATE 5 MG/5ML IV SOLN
5.0000 mg | Freq: Once | INTRAVENOUS | Status: AC
  Administered 2018-03-31: 5 mg via INTRAVENOUS

## 2018-03-31 MED ORDER — MIDAZOLAM HCL 2 MG/2ML IJ SOLN
2.0000 mg | Freq: Once | INTRAMUSCULAR | Status: AC | PRN
  Administered 2018-04-02: 2 mg via INTRAVENOUS

## 2018-03-31 MED ORDER — METOPROLOL TARTRATE 5 MG/5ML IV SOLN
5.0000 mg | INTRAVENOUS | Status: DC | PRN
  Administered 2018-03-31: 5 mg via INTRAVENOUS
  Filled 2018-03-31: qty 5

## 2018-03-31 MED ORDER — MIDAZOLAM 50MG/50ML (1MG/ML) PREMIX INFUSION
2.0000 mg/h | INTRAVENOUS | Status: DC
  Administered 2018-03-31 (×4): 10 mg/h via INTRAVENOUS
  Administered 2018-03-31: 2 mg/h via INTRAVENOUS
  Administered 2018-04-01 – 2018-04-02 (×9): 10 mg/h via INTRAVENOUS
  Administered 2018-04-02: 8 mg/h via INTRAVENOUS
  Filled 2018-03-31 (×14): qty 50

## 2018-03-31 MED ORDER — CISATRACURIUM BOLUS VIA INFUSION
0.0500 mg/kg | Freq: Once | INTRAVENOUS | Status: AC
  Administered 2018-03-31: 2.9 mg via INTRAVENOUS
  Filled 2018-03-31: qty 3

## 2018-03-31 MED ORDER — SODIUM CHLORIDE 0.9 % IV SOLN
3.0000 ug/kg/min | INTRAVENOUS | Status: DC
  Administered 2018-03-31: 3 ug/kg/min via INTRAVENOUS
  Administered 2018-03-31: 5.5 ug/kg/min via INTRAVENOUS
  Administered 2018-04-01: 6.5 ug/kg/min via INTRAVENOUS
  Administered 2018-04-01: 5.5 ug/kg/min via INTRAVENOUS
  Administered 2018-04-02: 7 ug/kg/min via INTRAVENOUS
  Filled 2018-03-31 (×6): qty 20

## 2018-03-31 MED ORDER — METOPROLOL TARTRATE 5 MG/5ML IV SOLN
5.0000 mg | Freq: Once | INTRAVENOUS | Status: AC
  Administered 2018-03-31: 5 mg via INTRAVENOUS
  Filled 2018-03-31: qty 5

## 2018-03-31 MED ORDER — MIDAZOLAM BOLUS VIA INFUSION
2.0000 mg | INTRAVENOUS | Status: DC | PRN
  Administered 2018-03-31 – 2018-04-02 (×21): 2 mg via INTRAVENOUS
  Filled 2018-03-31: qty 2

## 2018-03-31 MED ORDER — HYDRALAZINE HCL 20 MG/ML IJ SOLN
10.0000 mg | INTRAMUSCULAR | Status: DC | PRN
  Administered 2018-03-31 – 2018-04-02 (×2): 10 mg via INTRAVENOUS
  Filled 2018-03-31 (×2): qty 1

## 2018-03-31 MED ORDER — ARTIFICIAL TEARS OPHTHALMIC OINT
TOPICAL_OINTMENT | Freq: Three times a day (TID) | OPHTHALMIC | Status: DC
  Filled 2018-03-31: qty 3.5

## 2018-03-31 MED ORDER — MIDAZOLAM HCL 2 MG/2ML IJ SOLN
2.0000 mg | Freq: Once | INTRAMUSCULAR | Status: AC
  Administered 2018-03-31: 2 mg via INTRAVENOUS
  Filled 2018-03-31: qty 2

## 2018-03-31 MED ORDER — VANCOMYCIN HCL IN DEXTROSE 1-5 GM/200ML-% IV SOLN: 1000.0000 mg | Freq: Two times a day (BID) | INTRAVENOUS | Status: DC

## 2018-03-31 MED ORDER — FUROSEMIDE 10 MG/ML IJ SOLN
20.0000 mg | Freq: Once | INTRAMUSCULAR | Status: AC
  Administered 2018-03-31: 20 mg via INTRAVENOUS
  Filled 2018-03-31: qty 2

## 2018-03-31 MED ORDER — PROPOFOL 1000 MG/100ML IV EMUL
5.0000 ug/kg/min | INTRAVENOUS | Status: DC
  Administered 2018-03-31: 20 ug/kg/min via INTRAVENOUS
  Administered 2018-04-01: 70 ug/kg/min via INTRAVENOUS
  Administered 2018-04-01 (×2): 45 ug/kg/min via INTRAVENOUS
  Administered 2018-04-01: 40 ug/kg/min via INTRAVENOUS
  Administered 2018-04-02: 80 ug/kg/min via INTRAVENOUS
  Administered 2018-04-02: 70 ug/kg/min via INTRAVENOUS
  Administered 2018-04-02 (×3): 80 ug/kg/min via INTRAVENOUS
  Administered 2018-04-02: 60 ug/kg/min via INTRAVENOUS
  Administered 2018-04-03 (×2): 80 ug/kg/min via INTRAVENOUS
  Filled 2018-03-31 (×7): qty 100
  Filled 2018-03-31: qty 200
  Filled 2018-03-31 (×3): qty 100

## 2018-03-31 MED ORDER — METOPROLOL TARTRATE 5 MG/5ML IV SOLN: 5.0000 mg | Freq: Four times a day (QID) | INTRAVENOUS | Status: DC | PRN

## 2018-03-31 MED ORDER — SENNOSIDES-DOCUSATE SODIUM 8.6-50 MG PO TABS
1.0000 | ORAL_TABLET | Freq: Every day | ORAL | Status: DC
  Administered 2018-04-01 – 2018-04-02 (×2): 1 via ORAL
  Filled 2018-03-31 (×2): qty 1

## 2018-03-31 MED ORDER — VANCOMYCIN HCL 10 G IV SOLR
1250.0000 mg | Freq: Once | INTRAVENOUS | Status: AC
  Administered 2018-03-31: 1250 mg via INTRAVENOUS
  Filled 2018-03-31: qty 1250

## 2018-03-31 MED ORDER — METOPROLOL TARTRATE 5 MG/5ML IV SOLN
10.0000 mg | INTRAVENOUS | Status: DC | PRN
  Administered 2018-03-31 – 2018-04-01 (×3): 10 mg via INTRAVENOUS
  Filled 2018-03-31 (×2): qty 10

## 2018-03-31 MED ORDER — PROPOFOL 1000 MG/100ML IV EMUL
INTRAVENOUS | Status: AC
  Administered 2018-03-31: 20 ug/kg/min via INTRAVENOUS
  Filled 2018-03-31: qty 100

## 2018-03-31 MED ORDER — BISACODYL 10 MG RE SUPP
10.0000 mg | Freq: Once | RECTAL | Status: AC
  Administered 2018-03-31: 10 mg via RECTAL
  Filled 2018-03-31: qty 1

## 2018-03-31 MED ORDER — METOPROLOL TARTRATE 5 MG/5ML IV SOLN
INTRAVENOUS | Status: AC
  Filled 2018-03-31: qty 5

## 2018-03-31 MED ORDER — METOPROLOL TARTRATE 5 MG/5ML IV SOLN: 5.0000 mg | INTRAVENOUS | Status: DC | PRN

## 2018-03-31 MED ORDER — SODIUM CHLORIDE 0.9 % IV SOLN
1.0000 g | Freq: Three times a day (TID) | INTRAVENOUS | Status: AC
  Administered 2018-03-31 – 2018-04-05 (×15): 1 g via INTRAVENOUS
  Filled 2018-03-31 (×15): qty 1

## 2018-03-31 MED ORDER — METOPROLOL TARTRATE 5 MG/5ML IV SOLN
INTRAVENOUS | Status: AC
  Filled 2018-03-31: qty 10

## 2018-03-31 MED ORDER — SODIUM CHLORIDE 0.9 % IV SOLN
0.5000 mg/kg/h | INTRAVENOUS | Status: DC
  Administered 2018-03-31: 1 mg/kg/h via INTRAVENOUS
  Administered 2018-04-01 (×2): 1.5 mg/kg/h via INTRAVENOUS
  Filled 2018-03-31 (×6): qty 5

## 2018-03-31 NOTE — Procedures (Signed)
Arterial Catheter Insertion Procedure Note BREALYN FOERST [id number] Dec 07, 2000  Procedure: Insertion of Arterial Catheter  Indications: Frequent blood sampling  Procedure Details Consent: Risks of procedure as well as the alternatives and risks of each were explained to the (patient/caregiver).  Consent for procedure obtained. Time Out: Verified patient identification, verified procedure, site/side was marked, verified correct patient position, special equipment/implants available, medications/allergies/relevent history reviewed, required imaging and test results available.  Performed  Maximum sterile technique was used including antiseptics, cap, gloves, gown, hand hygiene, mask and sheet. Skin prep: Chlorhexidine; local anesthetic administered 20 gauge catheter was inserted into left radial artery using the Seldinger technique.  Evaluation Blood flow good; BP tracing good. Complications: No apparent complications.   Richmond Campbell A Lavanda Nevels 03/31/2018

## 2018-03-31 NOTE — Progress Notes (Signed)
eLink Physician-Brief Progress Note Patient Name: Lindsay Lara DOB: [birth year] MRN: [id number]   Date of Service  03/31/2018  HPI/Events of Note  Agitation - Not sedated on Ketamine IV infusion. BIS = 70. Remains hypertensive and tachycardic. BP = 224/98 with MAP = 134 and HR = 115.Marland Kitchen  eICU Interventions  Will order: 1. Add Propofol IV infusion. Titrate to RASS = 0 to -1.      Intervention Category Major Interventions: Delirium, psychosis, severe agitation - evaluation and management  Sommer,Steven Eugene 03/31/2018, 10:18 PM

## 2018-03-31 NOTE — Progress Notes (Signed)
Still having fever, and respiratory secretions.  Will change ABx to vancomycin, cefepime.  Coralyn Helling, MD [facility] Pulmonary/Critical Care 03/31/2018, 2:50 PM

## 2018-03-31 NOTE — Progress Notes (Signed)
Pt now paralyzed & vent settings changed per MD order at this time. Pt now on ARDS protocol.  VT decreased to 69ml.  Will follow up with ABG at 1300.

## 2018-03-31 NOTE — Progress Notes (Signed)
While assessing TOF at 2015, pt 0/4 twitches @ 35 mA on head.  This was verified on backup site on R arm.  Nimbex titrated from 5.62mcg/kg/min to 5.  Pt was then reassessed at 2045 to still have 0/4 twitches at both sites.  Pt now breathing over ventilator at rate of 40(ventilator set on 32).  Nimbex gtt titrated back up to 5.55mcg/kg/min.  Will continue to monitor pt.

## 2018-03-31 NOTE — Progress Notes (Addendum)
NAME:  Lindsay Lara, MRN:  [id number], DOB:  06-18-2000, LOS: 10 ADMISSION DATE:  03/21/2018, CONSULTATION DATE:  03/21/2018 REFERRING MD:  Dr. [name] Carl CHIEF COMPLAINT:  Altered Mental Status    Brief History   18 yr old female w/ PMHx ADHD, Anxiety, Bipolar depression,previous suicide attempts,Polysubstance abuse (cocaine, Marijuana,heroin and ETOH) presentsvia EMS after Mom found pt unresponsive. Bradycardic to 40sandhypotensive with SBP in the 40s. Intubated.Given Glucagon.Started on epi gtt. PCCM consulted for Intentional OD (propanolol, xanax and Seroquel)  Significant Hospital Events   1/16>Admission  1/16>Seen by Neurology - MRI for possible lateralizing signs  Consults:  PCCM Neurology   Procedures:  1/16> Intubated 1/16>CVL placed  1/17> chest tube placed 1/18>Bedside bronchoscopy , Therapeutic suctioning  1/21>extubated 1/21>reintubated   Significant Diagnostic Tests:  CT head: Unremarkable  EKG: RBBB, Prolonged QTc 528 EEG: possible right focal cerebral dysfunction. No clear seizure activity. MRI: Negative for acute infarct or mass, Question diffuse cortical edema right hemisphere on axial T2 images.While this could be due to artifact, consider seizure related cortical edema. EEG 1/23: no seizure activity   Micro Data:  1/18 Urine Cx> 1/18 Urinalysis> 1/18 Blood Cx> 1/18 Tracheal Aspirate Cx> Rare GNR, pending sensitivity/cx  1/21 Repeat Resp culture > rare candida  1/24 Sputum > 1/25 Blood >  Antimicrobials:  1/18 Zosyn>1/19 1/18 Vancomycin>1/19 1/19 Unasyn> 1/21 1/21 Ampicillin > 1/23 1/23 Unasyn >   Interim history/subjective:  Intubated and sedated   Objective   Blood pressure (!) 100/51, pulse 84, temperature 99.8 F (37.7 C), temperature source Oral, resp. rate (!) 21, height 5\' 5"  (1.651 m), weight 58 kg, last menstrual period 03/21/2018, SpO2 100 %.    Vent Mode: PRVC FiO2 (%):  [90 %-100 %] 90 % Set Rate:  [18 bmp] 18 bmp Vt Set:   [450 mL] 450 mL PEEP:  [10 cmH20] 10 cmH20 Plateau Pressure:  [30 cmH20-40 cmH20] 34 cmH20   Intake/Output Summary (Last 24 hours) at 03/31/2018 0768 Last data filed at 03/31/2018 0600 Gross per 24 hour  Intake 2608.58 ml  Output 860 ml  Net 1748.58 ml   Filed Weights   03/29/18 0500 03/30/18 0500 03/31/18 0235  Weight: 58.9 kg 58.9 kg 58 kg    Examination:  General: sedated Eyes: pupils reactive ENT: ETT in place Cardiac: regular rate/rhythm, no murmur Pulmonary: bilateral wheezing, rales on left  Abdomen: soft, non tender, hypoactive BS Extremities: no cyanosis, clubbing, or edema Skin: c/d/i  Neuro: RASS -3, moves to physical stimuli   Resolved Hospital Problem list   Left apical Pneumothorax    Assessment & Plan:  18 yr old female w/ PMHx ADHD, Anxiety, Bipolar depression,previous suicide attempts,Polysubstance abuse (cocaine, Marijuana,heroin and ETOH) presentsfollowing intentional drug overdose with Propranolol and Seroquel. Utox positive for benzos and THC.   Acute respiratory failure secondary to respiratory depression from intentional overdose  HAP/Aspiration Pneumonia  Pulmonary Edema  Plan FiO2 decreased from 90% to 80% this morning. Chest x-ray similar to previous with diffuse interstitial opacity.  - switch to ARDs protocol, 6mg /kg - switch to dilaudid, versed, nimbex gtt  - d/c precedex, fentanyl  - abg in one hour  - f/u CXR - prn BDs - continue unasyn - f/u blood & resp cultures  - cont. Tube feeds   Acute Encephalopathy at Admission Alcohol use disorder Marijuana use disorder Opioid use disorder Cocaine use disorder ADHD, bipolar disorder depression, anxiety disorder, previous suicidal attempt Plan Goal RASS to -2 as patient self-extubated 1/24 and will need  time on vent.  - continue atarax 25 q8h, seroquel 300 qhs - d/c precedex, diprivan, fentanyl - versed, dilaudid gtt  High risk behavior Labial Swelling Plan - continue acyclovir   - Known history of genital herpes  Constipation Plan - glycerin suppository prn  - dulcolax today  - sennokot scheduled   Best practice:  Diet:tube feeds DVT prophylaxis:Lovenox GI prophylaxis:protonix 40qd Mobility: bed rest Code Status:FULL Family Communication: updated father at bedside  Labs   CBC: Recent Labs  Lab 03/27/18 0408 03/28/18 0627 03/29/18 0441 03/30/18 0434 03/31/18 0450  WBC 11.9* 12.6* 14.7* 17.8* 14.5*  NEUTROABS  --   --   --  14.3*  --   HGB 9.2* 9.5* 9.4* 9.5* 8.3*  HCT 28.0* 30.0* 30.7* 30.6* 27.5*  MCV 91.5 92.0 93.9 92.4 96.2  PLT 166 159 147* 152 186    Basic Metabolic Panel: Recent Labs  Lab 03/27/18 0408 03/28/18 0430 03/29/18 0441 03/30/18 0434 03/30/18 0841 03/30/18 1838 03/31/18 0450  NA 142 141 141 145  --   --  144  K 3.2* 3.9 4.2 3.2*  --   --  4.5  CL 111 114* 113* 107  --   --  109  CO2 25 24 25 26   --   --  27  GLUCOSE 88 102* 102* 84  --   --  101*  BUN <5* 6 7 7   --   --  14  CREATININE 0.96 0.83 0.97 0.96  --   --  0.71  CALCIUM 7.6* 7.5* 7.6* 7.1*  --   --  7.4*  MG 1.8 1.9 1.8 1.5* 2.5* 3.0* 2.5*  PHOS 3.2 3.3  --  4.4 5.2* 4.6 4.0   GFR: Estimated Creatinine Clearance: 101.8 mL/min (by C-G formula based on SCr of 0.71 mg/dL). Recent Labs  Lab 03/28/18 0627 03/29/18 0441 03/30/18 0434 03/31/18 0450  WBC 12.6* 14.7* 17.8* 14.5*    Liver Function Tests: Recent Labs  Lab 03/28/18 0430  AST 22  ALT 76*  ALKPHOS 71  BILITOT 0.4  PROT 4.3*  ALBUMIN 1.5*   No results for input(s): LIPASE, AMYLASE in the last 168 hours. No results for input(s): AMMONIA in the last 168 hours.  ABG    Component Value Date/Time   PHART 7.406 03/30/2018 0400   PCO2ART 45.7 03/30/2018 0400   PO2ART 64.7 (L) 03/30/2018 0400   HCO3 28.2 (H) 03/30/2018 0400   TCO2 26 03/23/2018 0213   ACIDBASEDEF 1.0 03/28/2018 0420   O2SAT 92.1 03/30/2018 0400     Coagulation Profile: No results for input(s): INR, PROTIME in  the last 168 hours.  Cardiac Enzymes: Recent Labs  Lab 03/25/18 1220  TROPONINI 0.05*    HbA1C: Hgb A1c MFr Bld  Date/Time Value Ref Range Status  02/10/2018 06:55 AM 5.0 4.8 - 5.6 % Final    Comment:    (NOTE) Pre diabetes:          5.7%-6.4% Diabetes:              >6.4% Glycemic control for   <7.0% adults with diabetes   09/17/2015 06:42 PM 5.2 4.8 - 5.6 % Final    Comment:    (NOTE)         Pre-diabetes: 5.7 - 6.4         Diabetes: >6.4         Glycemic control for adults with diabetes: <7.0     CBG: Recent Labs  Lab 03/30/18 1102 03/30/18  1501 03/30/18 1916 03/30/18 2328 03/31/18 0339  GLUCAP 91 88 90 87 94     Cherylee Rawlinson A, DO 03/31/2018, 6:17 AM Pager: [phone]

## 2018-03-31 NOTE — Progress Notes (Signed)
Pharmacy Antibiotic Note  Lindsay Lara is a 18 y.o. female admitted on 03/21/2018 s/p overdose. She has been on Unasyn, planning to escalate antibiotics given fever + increased secretions. Scr 0.7.  Vancomycin 1000 mg IV Q 12 hrs. Goal AUC 400-550. Expected AUC: 538 SCr used: 0.8  *Since this is close to the upper end of goal, I am opting to reduce dose    Plan: -Cefepime 1g/8h -Vancomycin 1250 mg IV x1 then 750mg /12h -Monitor renal fx, cultures, levels as needed  Height: 5\' 5"  (165.1 cm) Weight: 127 lb 13.9 oz (58 kg) IBW/kg (Calculated) : 57  Temp (24hrs), Avg:100.1 F (37.8 C), Min:99.1 F (37.3 C), Max:101.4 F (38.6 C)  Recent Labs  Lab 03/27/18 0408 03/28/18 0430 03/28/18 0627 03/29/18 0441 03/30/18 0434 03/31/18 0450  WBC 11.9*  --  12.6* 14.7* 17.8* 14.5*  CREATININE 0.96 0.83  --  0.97 0.96 0.71     Antimicrobials this admission: Vancomycin 1/18>>1/19; 1/26 > Zosyn 1/18>>1/19 Unasyn 1/19 >> 1/21, 1/23 >> 1/26 Ampcillin 1/21 >> 1/23 Cefepime 1/26 >  Dose adjustments this admission: N/A  Microbiology results: 1/24 resp cx: reincubated 1/25 blood cx: ngtd 1/18 blood cx: neg 1/18 resp cx: yeast 1/18 urine cx: e.coli 1/18 resp cx: group B strep 1/17 mrsa pcr: neg   Baldemar Friday 03/31/2018 4:14 PM

## 2018-03-31 NOTE — Progress Notes (Signed)
Wasted 70 mL fentanyl in sink with Ardyth Gal, RN.

## 2018-03-31 NOTE — Progress Notes (Signed)
eLink Physician-Brief Progress Note Patient Name: Lindsay Lara DOB: [birth year] MRN: [id number]   Date of Service  03/31/2018  HPI/Events of Note  Multiple issues: 1. Pharmacy recommends changing Ketamine Infusion dose to 1.5 mcg/kg/hour and 2. BP = 224/103 by A-line.   eICU Interventions  Will order: 1. Increase Ketamine IV infusion to 1.5 mcg/kg/min.  2. Hydralazine 10 mg IV Q 4 hours PRN SBP > 180 or DBP > 100.      Intervention Category Major Interventions: Hypertension - evaluation and management  [name] 03/31/2018, 9:19 PM

## 2018-04-01 ENCOUNTER — Inpatient Hospital Stay (HOSPITAL_COMMUNITY): Payer: PRIVATE HEALTH INSURANCE

## 2018-04-01 DIAGNOSIS — J80 Acute respiratory distress syndrome: Secondary | ICD-10-CM

## 2018-04-01 LAB — GLUCOSE, CAPILLARY
GLUCOSE-CAPILLARY: 92 mg/dL (ref 70–99)
Glucose-Capillary: 105 mg/dL — ABNORMAL HIGH (ref 70–99)
Glucose-Capillary: 119 mg/dL — ABNORMAL HIGH (ref 70–99)
Glucose-Capillary: 102 mg/dL — ABNORMAL HIGH (ref 70–99)
Glucose-Capillary: 118 mg/dL — ABNORMAL HIGH (ref 70–99)
Glucose-Capillary: 93 mg/dL (ref 70–99)
Glucose-Capillary: 101 mg/dL — ABNORMAL HIGH (ref 70–99)

## 2018-04-01 LAB — POCT I-STAT 7, (LYTES, BLD GAS, ICA,H+H)
Acid-Base Excess: 8 mmol/L — ABNORMAL HIGH (ref 0.0–2.0)
BICARBONATE: 34.2 mmol/L — AB (ref 20.0–28.0)
Calcium, Ion: 1.17 mmol/L (ref 1.15–1.40)
HCT: 26 % — ABNORMAL LOW (ref 36.0–46.0)
Hemoglobin: 8.8 g/dL — ABNORMAL LOW (ref 12.0–15.0)
O2 Saturation: 95 %
Potassium: 3.8 mmol/L (ref 3.5–5.1)
Sodium: 142 mmol/L (ref 135–145)
TCO2: 36 mmol/L — ABNORMAL HIGH (ref 22–32)
pCO2 arterial: 58 mmHg — ABNORMAL HIGH (ref 32.0–48.0)
pH, Arterial: 7.378 (ref 7.350–7.450)
pO2, Arterial: 79 mmHg — ABNORMAL LOW (ref 83.0–108.0)

## 2018-04-01 LAB — CBC
HCT: 27.8 % — ABNORMAL LOW (ref 36.0–46.0)
Hemoglobin: 8.2 g/dL — ABNORMAL LOW (ref 12.0–15.0)
MCH: 28.6 pg (ref 26.0–34.0)
MCHC: 29.5 g/dL — ABNORMAL LOW (ref 30.0–36.0)
MCV: 96.9 fL (ref 80.0–100.0)
Platelets: 242 10*3/uL (ref 150–400)
RBC: 2.87 MIL/uL — ABNORMAL LOW (ref 3.87–5.11)
RDW: 14.6 % (ref 11.5–15.5)
WBC: 13 10*3/uL — AB (ref 4.0–10.5)
nRBC: 0 % (ref 0.0–0.2)

## 2018-04-01 LAB — BLOOD GAS, ARTERIAL
Acid-Base Excess: 5.7 mmol/L — ABNORMAL HIGH (ref 0.0–2.0)
Bicarbonate: 31.3 mmol/L — ABNORMAL HIGH (ref 20.0–28.0)
Drawn by: 10006
FIO2: 0.5
MECHVT: 340 mL
O2 Saturation: 96.1 %
PEEP: 12 cmH2O
Patient temperature: 99.5
RATE: 32 resp/min
pCO2 arterial: 62 mmHg — ABNORMAL HIGH (ref 32.0–48.0)
pH, Arterial: 7.326 — ABNORMAL LOW (ref 7.350–7.450)
pO2, Arterial: 87.9 mmHg (ref 83.0–108.0)

## 2018-04-01 LAB — CULTURE, RESPIRATORY W GRAM STAIN: Culture: NORMAL

## 2018-04-01 LAB — BASIC METABOLIC PANEL
ANION GAP: 8 (ref 5–15)
BUN: 14 mg/dL (ref 6–20)
CO2: 28 mmol/L (ref 22–32)
Calcium: 7.6 mg/dL — ABNORMAL LOW (ref 8.9–10.3)
Chloride: 105 mmol/L (ref 98–111)
Creatinine, Ser: 0.63 mg/dL (ref 0.44–1.00)
GFR calc non Af Amer: >60 mL/min (ref >60)
Glucose, Bld: 125 mg/dL — ABNORMAL HIGH (ref 70–99)
Potassium: 4.2 mmol/L (ref 3.5–5.1)
SODIUM: 141 mmol/L (ref 135–145)

## 2018-04-01 LAB — TRIGLYCERIDES: Triglycerides: 220 mg/dL — ABNORMAL HIGH (ref <150)

## 2018-04-01 MED ORDER — FUROSEMIDE 10 MG/ML IJ SOLN
40.0000 mg | Freq: Once | INTRAMUSCULAR | Status: AC
  Administered 2018-04-01: 40 mg via INTRAVENOUS
  Filled 2018-04-01: qty 4

## 2018-04-01 MED ORDER — METOPROLOL TARTRATE 5 MG/5ML IV SOLN
10.0000 mg | INTRAVENOUS | Status: DC | PRN
  Administered 2018-04-02 – 2018-04-11 (×3): 10 mg via INTRAVENOUS
  Filled 2018-04-01 (×4): qty 10

## 2018-04-01 NOTE — Plan of Care (Signed)
  Problem: Education: Goal: Knowledge of General Education information will improve Description Including pain rating scale, medication(s)/side effects and non-pharmacologic comfort measures Outcome: Progressing Note:  Mother at bedside, patient is intubated and chemically paralyzed.   Problem: Clinical Measurements: Goal: Ability to maintain clinical measurements within normal limits will improve Outcome: Progressing Goal: Will remain free from infection Outcome: Progressing Goal: Diagnostic test results will improve Outcome: Progressing Goal: Respiratory complications will improve Outcome: Progressing Note:  Intubated and chemically paralyzed  Goal: Cardiovascular complication will be avoided Outcome: Progressing Note:  BP and HR elevated at times, metoprolol and hydralazine PRN   Problem: Nutrition: Goal: Adequate nutrition will be maintained Outcome: Progressing Note:  Tube feedings   Problem: Coping: Goal: Level of anxiety will decrease Outcome: Progressing Note:  Sedated   Problem: Elimination: Goal: Will not experience complications related to urinary retention Outcome: Progressing Note:  Foley in place   Problem: Skin Integrity: Goal: Risk for impaired skin integrity will decrease Outcome: Progressing   Problem: Elimination: Goal: Will not experience complications related to bowel motility Outcome: Not Progressing Note:  LBM 1/20

## 2018-04-01 NOTE — Progress Notes (Signed)
Spoke with Dr. Cleaster Corin regarding pt's 0/4 twitches on TOF. She is OK with 0/4 twitches for now. Will not titrate down Nimbex at this time. Will re-evaluate once attending assesses pt.

## 2018-04-01 NOTE — Progress Notes (Signed)
Increased Nimbex to 15mcg/kg/min. Pt was inline suctioned, and RR increased to 50. 1/4 twitches noted. Will continue to closely monitor pt.

## 2018-04-01 NOTE — Progress Notes (Signed)
NAME:  Lindsay Lara, MRN:  [id number], DOB:  06/07/2000, LOS: 11 ADMISSION DATE:  03/21/2018, CONSULTATION DATE:  03/21/2018 REFERRING MD:  Dr. [name] Carl CHIEF COMPLAINT:  Altered Mental Status    Brief History   18 yr old female w/ PMHx ADHD, Anxiety, Bipolar depression,previous suicide attempts,Polysubstance abuse (cocaine, Marijuana,heroin and ETOH) presentsvia EMS after Mom found pt unresponsive. Bradycardic to 40sandhypotensive with SBP in the 40s. Intubated.Given Glucagon.Started on epi gtt. PCCM consulted for Intentional OD (propanolol, xanax and Seroquel)  Significant Hospital Events   1/16>Admission  1/16>Seen by Neurology - MRI for possible lateralizing signs  Consults:  PCCM Neurology   Procedures:  1/16> Intubated 1/16>CVL placed  1/17> chest tube placed 1/18>Bedside bronchoscopy , Therapeutic suctioning  1/19>extubated>reintubated 1/24>extubated 1/24>reintubated    Significant Diagnostic Tests:  CT head: Unremarkable  EKG: RBBB, Prolonged QTc 528 EEG: possible right focal cerebral dysfunction. No clear seizure activity. MRI: Negative for acute infarct or mass, Question diffuse cortical edema right hemisphere on axial T2 images.While this could be due to artifact, consider seizure related cortical edema. EEG 1/23: no seizure activity   Micro Data:  1/18 Urine Cx> 1/18 Urinalysis> 1/18 Blood Cx> 1/18 Tracheal Aspirate Cx> Rare GNR, pending sensitivity/cx  1/21 Repeat Resp culture > rare candida  1/24 Sputum > 1/25 Blood >  Antimicrobials:  1/18 Zosyn>1/19 1/18 Vancomycin>1/19 1/19 Unasyn> 1/21 1/21 Ampicillin > 1/23 1/23 Unasyn >   Interim history/subjective:  Intubated, paralyzed and sedated. Decreased agitation from yesterday.   Objective   Blood pressure 106/62, pulse (!) 109, temperature 99.5 F (37.5 C), temperature source Rectal, resp. rate (!) 32, height 5\' 5"  (1.651 m), weight 56.7 kg, last menstrual period 03/21/2018, SpO2 96 %.      Vent Mode: PRVC FiO2 (%):  [50 %-100 %] 50 % Set Rate:  [18 bmp-32 bmp] 32 bmp Vt Set:  [340 mL-450 mL] 340 mL PEEP:  [10 cmH20-14 cmH20] 12 cmH20 Plateau Pressure:  [30 cmH20-35 cmH20] 30 cmH20   Intake/Output Summary (Last 24 hours) at 04/01/2018 0606 Last data filed at 04/01/2018 0600 Gross per 24 hour  Intake 3394.63 ml  Output 2810 ml  Net 584.63 ml   Filed Weights   03/30/18 0500 03/31/18 0235 04/01/18 0345  Weight: 58.9 kg 58 kg 56.7 kg    Examination:  General: paralyzed and sedated, NAD Eyes: pupils reactive ENT: ETT in place, AT/[place] Cardiac: tachycardic, regular rhythm, no murmur Pulmonary: bilateral wheezing and rales  Abdomen: soft, non tender, hypoactive BS Extremities: no cyanosis, clubbing, or edema Skin: c/d/i  Neuro: RASS -3  Resolved Hospital Problem list   Left apical Pneumothorax    Assessment & Plan:  18 yr old female w/ PMHx ADHD, Anxiety, Bipolar depression,previous suicide attempts,Polysubstance abuse (cocaine, Marijuana,heroin and ETOH) presentsfollowing intentional drug overdose with Propranolol and Seroquel. Utox positive for benzos and THC.   Acute respiratory failure secondary to respiratory depression from intentional overdose  ARDs 2/2 HAP/Aspiration Pneumonia   Plan On ARDs protocol. CXR similar to yesterday showing diffuse patchy infiltrate.   - switched from unasyn to vanc/cefepime yesterday  - cont. ARDs protocol, 6mg /kg  - daily cxr, cbc, bmp, mag - prn BDs - f/u blood & resp cultures  - cont. Tube feeds   Acute Encephalopathy at Admission Alcohol use disorder Marijuana use disorder Opioid use disorder Cocaine use disorder ADHD, bipolar disorder depression, anxiety disorder, previous suicidal attempt Plan Paralyzed yesterday due to continued agitation and self-extubation 1/24. Yesterday switched to versed, dilaudid gtt. Ketamine and  propofol added overnight for continued elevation in BIS, hypertension, and tachycardic.  -  continue atarax 25 q8h, seroquel 300 qhs - wean ketamine - cont. Versed, dilaudid gtt, propofol  - bolus versed prn   High risk behavior Labial Swelling Plan - continue acyclovir  - Known history of genital herpes  Constipation Plan Dulcolax suppository given yesterday  - glycerin suppository prn  - sennokot scheduled   Best practice:  Diet:tube feeds DVT prophylaxis:Lovenox GI prophylaxis:protonix 40qd Mobility: bed rest Code Status:FULL Family Communication: family asleep at bedside   Labs   CBC: Recent Labs  Lab 03/28/18 0627 03/29/18 0441 03/30/18 0434 03/31/18 0450 03/31/18 1320 03/31/18 1717 04/01/18 0417  WBC 12.6* 14.7* 17.8* 14.5*  --   --  13.0*  NEUTROABS  --   --  14.3*  --   --   --   --   HGB 9.5* 9.4* 9.5* 8.3* 9.5* 8.8* 8.2*  HCT 30.0* 30.7* 30.6* 27.5* 28.0* 26.0* 27.8*  MCV 92.0 93.9 92.4 96.2  --   --  96.9  PLT 159 147* 152 186  --   --  242    Basic Metabolic Panel: Recent Labs  Lab 03/28/18 0430 03/29/18 0441 03/30/18 0434 03/30/18 0841 03/30/18 1838 03/31/18 0450 03/31/18 1320 03/31/18 1717 03/31/18 1726 04/01/18 0417  NA 141 141 145  --   --  144 142 142  --  141  K 3.9 4.2 3.2*  --   --  4.5 4.0 4.0  --  4.2  CL 114* 113* 107  --   --  109  --   --   --  105  CO2 24 25 26   --   --  27  --   --   --  28  GLUCOSE 102* 102* 84  --   --  101*  --   --   --  125*  BUN 6 7 7   --   --  14  --   --   --  14  CREATININE 0.83 0.97 0.96  --   --  0.71  --   --   --  0.63  CALCIUM 7.5* 7.6* 7.1*  --   --  7.4*  --   --   --  7.6*  MG 1.9 1.8 1.5* 2.5* 3.0* 2.5*  --   --  1.8  --   PHOS 3.3  --  4.4 5.2* 4.6 4.0  --   --  3.8  --    GFR: Estimated Creatinine Clearance: 101.2 mL/min (by C-G formula based on SCr of 0.63 mg/dL). Recent Labs  Lab 03/29/18 0441 03/30/18 0434 03/31/18 0450 04/01/18 0417  WBC 14.7* 17.8* 14.5* 13.0*    Liver Function Tests: Recent Labs  Lab 03/28/18 0430  AST 22  ALT 76*  ALKPHOS 71    BILITOT 0.4  PROT 4.3*  ALBUMIN 1.5*   No results for input(s): LIPASE, AMYLASE in the last 168 hours. No results for input(s): AMMONIA in the last 168 hours.  ABG    Component Value Date/Time   PHART 7.326 (L) 04/01/2018 0400   PCO2ART 62.0 (H) 04/01/2018 0400   PO2ART 87.9 04/01/2018 0400   HCO3 31.3 (H) 04/01/2018 0400   TCO2 35 (H) 03/31/2018 1717   ACIDBASEDEF 1.0 03/28/2018 0420   O2SAT 96.1 04/01/2018 0400     Coagulation Profile: No results for input(s): INR, PROTIME in the last 168 hours.  Cardiac Enzymes: Recent Labs  Lab 03/25/18 1220  TROPONINI 0.05*    HbA1C: Hgb A1c MFr Bld  Date/Time Value Ref Range Status  02/10/2018 06:55 AM 5.0 4.8 - 5.6 % Final    Comment:    (NOTE) Pre diabetes:          5.7%-6.4% Diabetes:              >6.4% Glycemic control for   <7.0% adults with diabetes   09/17/2015 06:42 PM 5.2 4.8 - 5.6 % Final    Comment:    (NOTE)         Pre-diabetes: 5.7 - 6.4         Diabetes: >6.4         Glycemic control for adults with diabetes: <7.0     CBG: Recent Labs  Lab 03/31/18 1100 03/31/18 1502 03/31/18 1953 03/31/18 2317 04/01/18 0242  GLUCAP 90 108* 115* 105* 119*     Tayley Mudrick A, DO 04/01/2018, 6:06 AM Pager: [phone]

## 2018-04-01 NOTE — Progress Notes (Signed)
Increased Nimbex to 6.5 mcg/kg/min. While suctioning ETT, pt's RR continues to increase to 38. 0/4 twitches on TOF. Dr. Cleaster Corin aware. Will continue to closely monitor pt.

## 2018-04-01 NOTE — Progress Notes (Signed)
Wasted 10 mL Ketamine in sharps with Mickeal Needy, RN.

## 2018-04-01 NOTE — Progress Notes (Signed)
1 Bag of Ketamine ( ) returned to Robyne Askew in pharmacy.

## 2018-04-02 ENCOUNTER — Inpatient Hospital Stay (HOSPITAL_COMMUNITY): Payer: PRIVATE HEALTH INSURANCE

## 2018-04-02 DIAGNOSIS — J9600 Acute respiratory failure, unspecified whether with hypoxia or hypercapnia: Secondary | ICD-10-CM

## 2018-04-02 DIAGNOSIS — R451 Restlessness and agitation: Secondary | ICD-10-CM

## 2018-04-02 LAB — BLOOD GAS, ARTERIAL
Acid-Base Excess: 10.1 mmol/L — ABNORMAL HIGH (ref 0.0–2.0)
Bicarbonate: 34.8 mmol/L — ABNORMAL HIGH (ref 20.0–28.0)
Drawn by: 249101
FIO2: 50
MECHVT: 340 mL
O2 Saturation: 98.3 %
PATIENT TEMPERATURE: 99.7
PEEP: 10 cmH2O
RATE: 30 resp/min
pCO2 arterial: 54.9 mmHg — ABNORMAL HIGH (ref 32.0–48.0)
pH, Arterial: 7.422 (ref 7.350–7.450)
pO2, Arterial: 123 mmHg — ABNORMAL HIGH (ref 83.0–108.0)

## 2018-04-02 LAB — ECHOCARDIOGRAM COMPLETE
Height: 65 in
Weight: 2059.98 oz

## 2018-04-02 LAB — GLUCOSE, CAPILLARY
GLUCOSE-CAPILLARY: 107 mg/dL — AB (ref 70–99)
GLUCOSE-CAPILLARY: 111 mg/dL — AB (ref 70–99)
Glucose-Capillary: 112 mg/dL — ABNORMAL HIGH (ref 70–99)
Glucose-Capillary: 81 mg/dL (ref 70–99)
Glucose-Capillary: 85 mg/dL (ref 70–99)

## 2018-04-02 LAB — BASIC METABOLIC PANEL
Anion gap: 9 (ref 5–15)
Anion gap: 9 (ref 5–15)
BUN: 12 mg/dL (ref 6–20)
BUN: 12 mg/dL (ref 6–20)
CALCIUM: 8.1 mg/dL — AB (ref 8.9–10.3)
CO2: 31 mmol/L (ref 22–32)
CO2: 34 mmol/L — ABNORMAL HIGH (ref 22–32)
Calcium: 7.5 mg/dL — ABNORMAL LOW (ref 8.9–10.3)
Chloride: 99 mmol/L (ref 98–111)
Chloride: 96 mmol/L — ABNORMAL LOW (ref 98–111)
Creatinine, Ser: 0.64 mg/dL (ref 0.44–1.00)
Creatinine, Ser: 0.58 mg/dL (ref 0.44–1.00)
GFR calc Af Amer: >60 mL/min (ref >60)
GFR calc Af Amer: >60 mL/min (ref >60)
GFR calc non Af Amer: >60 mL/min (ref >60)
GFR calc non Af Amer: >60 mL/min (ref >60)
Glucose, Bld: 94 mg/dL (ref 70–99)
Glucose, Bld: 81 mg/dL (ref 70–99)
Potassium: 3.4 mmol/L — ABNORMAL LOW (ref 3.5–5.1)
Potassium: 3.6 mmol/L (ref 3.5–5.1)
SODIUM: 139 mmol/L (ref 135–145)
Sodium: 139 mmol/L (ref 135–145)

## 2018-04-02 LAB — CBC
HCT: 28.8 % — ABNORMAL LOW (ref 36.0–46.0)
Hemoglobin: 9 g/dL — ABNORMAL LOW (ref 12.0–15.0)
MCH: 29.4 pg (ref 26.0–34.0)
MCHC: 31.3 g/dL (ref 30.0–36.0)
MCV: 94.1 fL (ref 80.0–100.0)
Platelets: 340 10*3/uL (ref 150–400)
RBC: 3.06 MIL/uL — ABNORMAL LOW (ref 3.87–5.11)
RDW: 14.4 % (ref 11.5–15.5)
WBC: 12.2 10*3/uL — ABNORMAL HIGH (ref 4.0–10.5)
nRBC: 0 % (ref 0.0–0.2)

## 2018-04-02 LAB — MAGNESIUM: Magnesium: 1.7 mg/dL (ref 1.7–2.4)

## 2018-04-02 MED ORDER — POTASSIUM CHLORIDE CRYS ER 20 MEQ PO TBCR: 40.0000 meq | EXTENDED_RELEASE_TABLET | ORAL | Status: DC

## 2018-04-02 MED ORDER — FUROSEMIDE 10 MG/ML IJ SOLN
60.0000 mg | Freq: Once | INTRAMUSCULAR | Status: AC
  Administered 2018-04-02: 60 mg via INTRAVENOUS
  Filled 2018-04-02: qty 6

## 2018-04-02 MED ORDER — MAGNESIUM CITRATE PO SOLN
1.0000 | Freq: Once | ORAL | Status: DC
  Filled 2018-04-02: qty 296

## 2018-04-02 MED ORDER — DEXMEDETOMIDINE HCL IN NACL 400 MCG/100ML IV SOLN
0.4000 ug/kg/h | INTRAVENOUS | Status: DC
  Administered 2018-04-02 (×3): 1.7 ug/kg/h via INTRAVENOUS
  Administered 2018-04-03: 2 ug/kg/h via INTRAVENOUS
  Administered 2018-04-03: 1.37 ug/kg/h via INTRAVENOUS
  Administered 2018-04-03: 1.7 ug/kg/h via INTRAVENOUS
  Administered 2018-04-03 (×2): 2 ug/kg/h via INTRAVENOUS
  Administered 2018-04-03: 1.7 ug/kg/h via INTRAVENOUS
  Administered 2018-04-04 (×3): 2 ug/kg/h via INTRAVENOUS
  Filled 2018-04-02 (×13): qty 100

## 2018-04-02 MED ORDER — QUETIAPINE FUMARATE 300 MG PO TABS
300.0000 mg | ORAL_TABLET | Freq: Two times a day (BID) | ORAL | Status: DC
  Administered 2018-04-02 (×2): 300 mg
  Filled 2018-04-02 (×3): qty 1

## 2018-04-02 MED ORDER — POTASSIUM CHLORIDE CRYS ER 20 MEQ PO TBCR: 40.0000 meq | EXTENDED_RELEASE_TABLET | Freq: Once | ORAL | Status: DC

## 2018-04-02 MED ORDER — MIDAZOLAM HCL 2 MG/2ML IJ SOLN
1.0000 mg | INTRAMUSCULAR | Status: DC | PRN
  Administered 2018-04-02: 2 mg via INTRAVENOUS
  Filled 2018-04-02: qty 2

## 2018-04-02 MED ORDER — POTASSIUM CHLORIDE 20 MEQ/15ML (10%) PO SOLN
40.0000 meq | Freq: Once | ORAL | Status: AC
  Administered 2018-04-02: 40 meq via ORAL
  Filled 2018-04-02: qty 30

## 2018-04-02 MED ORDER — POTASSIUM CHLORIDE 20 MEQ/15ML (10%) PO SOLN
40.0000 meq | ORAL | Status: AC
  Administered 2018-04-02 (×2): 40 meq via ORAL
  Filled 2018-04-02 (×2): qty 30

## 2018-04-02 MED ORDER — SENNOSIDES-DOCUSATE SODIUM 8.6-50 MG PO TABS
1.0000 | ORAL_TABLET | Freq: Once | ORAL | Status: AC
  Administered 2018-04-02: 1 via ORAL
  Filled 2018-04-02: qty 1

## 2018-04-02 MED ORDER — HYDROMORPHONE HCL 1 MG/ML IJ SOLN
2.0000 mg | INTRAMUSCULAR | Status: DC | PRN
  Administered 2018-04-02 – 2018-04-03 (×7): 2 mg via INTRAVENOUS

## 2018-04-02 MED ORDER — OXYCODONE HCL 5 MG PO TABS
10.0000 mg | ORAL_TABLET | ORAL | Status: DC
  Administered 2018-04-02 – 2018-04-03 (×5): 10 mg
  Filled 2018-04-02 (×5): qty 2

## 2018-04-02 MED ORDER — SENNOSIDES-DOCUSATE SODIUM 8.6-50 MG PO TABS
2.0000 | ORAL_TABLET | Freq: Every day | ORAL | Status: DC
  Filled 2018-04-02 (×2): qty 2

## 2018-04-02 NOTE — Progress Notes (Signed)
Echocardiogram 2D Echocardiogram has been performed.  04/02/2018 4:31 PM Gertie Fey, MHA, RVT, RDCS, RDMS

## 2018-04-02 NOTE — Progress Notes (Addendum)
NAME:  Lindsay Lara, MRN:  [id number], DOB:  2000-04-13, LOS: 12 ADMISSION DATE:  03/21/2018, CONSULTATION DATE:  03/21/2018 REFERRING MD:  Dr. [name] CarlScheider CHIEF COMPLAINT:  Altered Mental Status    Brief History   18 yr old female w/ PMHx ADHD, Anxiety, Bipolar depression,previous suicide attempts,Polysubstance abuse (cocaine, Marijuana,heroin and ETOH) presentsvia EMS after Mom found pt unresponsive. Bradycardic to 40sandhypotensive with SBP in the 40s. Intubated.Given Glucagon.Started on epi gtt. PCCM consulted for Intentional OD (propanolol, xanax and Seroquel)  Significant Hospital Events   1/16>Admission  1/16>Seen by Neurology - MRI for possible lateralizing signs  Consults:  PCCM Neurology   Procedures:  1/16> Intubated 1/16>CVL placed  1/17> chest tube placed 1/18>Bedside bronchoscopy , Therapeutic suctioning  1/19>extubated>reintubated 1/24>extubated 1/24>reintubated    Significant Diagnostic Tests:  CT head: Unremarkable  EKG: RBBB, Prolonged QTc 528 EEG: possible right focal cerebral dysfunction. No clear seizure activity. MRI: Negative for acute infarct or mass, Question diffuse cortical edema right hemisphere on axial T2 images.While this could be due to artifact, consider seizure related cortical edema. EEG 1/23: no seizure activity   Micro Data:  1/18 Urine Cx> 1/18 Urinalysis> 1/18 Blood Cx> 1/18 Tracheal Aspirate Cx> Rare GNR, pending sensitivity/cx  1/21 Repeat Resp culture > rare candida  1/24 Sputum > normal resp flora  1/25 Blood > NGTD  Antimicrobials:  1/18 Zosyn>1/19 1/18 Vancomycin>1/19 1/19 Unasyn> 1/21 1/21 Ampicillin > 1/23 1/23 Unasyn > 1/26 1/26 Vanc/Cefepime   Interim history/subjective:  Intubated, paralyzed and sedated. Decreased agitation from yesterday.   Objective   Blood pressure (!) 151/87, pulse (!) 104, temperature 99.7 F (37.6 C), temperature source Core, resp. rate (!) 30, height 5\' 5"  (1.651 m), weight 58.4 kg,  last menstrual period 03/21/2018, SpO2 98 %.    Vent Mode: PRVC FiO2 (%):  [50 %] 50 % Set Rate:  [30 bmp-32 bmp] 30 bmp Vt Set:  [240 mL-340 mL] 240 mL PEEP:  [10 cmH20-12 cmH20] 10 cmH20 Plateau Pressure:  [25 cmH20-30 cmH20] 30 cmH20   Intake/Output Summary (Last 24 hours) at 04/02/2018 [id number] Last data filed at 04/02/2018 0600 Gross per 24 hour  Intake 3629.3 ml  Output 6075 ml  Net -2445.7 ml   Filed Weights   03/31/18 0235 04/01/18 0345 04/02/18 0500  Weight: 58 kg 56.7 kg 58.4 kg    Examination:  General: paralyzed and sedated, NAD Eyes: pupils reactive ENT: ETT in place, AT/[place] Cardiac: tachycardic, regular rhythm, no murmur Pulmonary: bilateral lower lobe wheezing and rales Abdomen: soft, non tender, hypoactive BS Extremities: no cyanosis, clubbing, or edema Skin: c/d/i  Neuro: RASS -3  Resolved Hospital Problem list   Left apical Pneumothorax    Assessment & Plan:  [age] yr old female w/ PMHx ADHD, Anxiety, Bipolar depression,previous suicide attempts,Polysubstance abuse (cocaine, Marijuana,heroin and ETOH) presentsfollowing intentional drug overdose with Propranolol and Seroquel. Utox positive for benzos and THC.   Acute respiratory failure secondary to respiratory depression from intentional overdose  ARDs 2/2 HAP/Aspiration Pneumonia   Plan On ARDs protocol. Cont. Current course with goals RASS -3.  - lasix 40mg  once; afternoon BMP  - replete K - cont. vanc/cefepime yesterday  - cont. ARDs protocol, 6mg /kg  - daily cxr, cbc, bmp, mag - prn BDs - f/u blood & resp cultures - NGTDx2days - cont. Tube feeds  - echo ordered   Acute Encephalopathy at Admission Alcohol use disorder Marijuana use disorder Opioid use disorder Cocaine use disorder ADHD, bipolar disorder depression, anxiety disorder, previous suicidal attempt  Plan Nimbex for 48 hours, increased to 7 overnight with BIS 30. Discontinued ketamine yesterday. Dilaudid increased to max 8.  -  nimbex vacation today - cont. Versed, propofol, dilaudid gtt - continue atarax 25 q8h, seroquel 300 qhs - bolus versed prn   High risk behavior Labial Swelling Plan - continue acyclovir  - Known history of genital herpes  Constipation Plan - glycerin suppository prn  - sennokot scheduled   Best practice:  Diet:tube feeds DVT prophylaxis:Lovenox GI prophylaxis:protonix 40qd Mobility: bed rest Code Status:FULL Family Communication: mom at bedside   Labs   CBC: Recent Labs  Lab 03/29/18 0441 03/30/18 0434 03/31/18 0450 03/31/18 1320 03/31/18 1717 04/01/18 0417 04/01/18 1030 04/02/18 0403  WBC 14.7* 17.8* 14.5*  --   --  13.0*  --  12.2*  NEUTROABS  --  14.3*  --   --   --   --   --   --   HGB 9.4* 9.5* 8.3* 9.5* 8.8* 8.2* 8.8* 9.0*  HCT 30.7* 30.6* 27.5* 28.0* 26.0* 27.8* 26.0* 28.8*  MCV 93.9 92.4 96.2  --   --  96.9  --  94.1  PLT 147* 152 186  --   --  242  --  340    Basic Metabolic Panel: Recent Labs  Lab 03/29/18 0441 03/30/18 0434 03/30/18 0841 03/30/18 1838 03/31/18 0450 03/31/18 1320 03/31/18 1717 03/31/18 1726 04/01/18 0417 04/01/18 1030 04/02/18 0403  NA 141 145  --   --  144 142 142  --  141 142 139  K 4.2 3.2*  --   --  4.5 4.0 4.0  --  4.2 3.8 3.4*  CL 113* 107  --   --  109  --   --   --  105  --  99  CO2 25 26  --   --  27  --   --   --  28  --  31  GLUCOSE 102* 84  --   --  101*  --   --   --  125*  --  94  BUN 7 7  --   --  14  --   --   --  14  --  12  CREATININE 0.97 0.96  --   --  0.71  --   --   --  0.63  --  0.64  CALCIUM 7.6* 7.1*  --   --  7.4*  --   --   --  7.6*  --  7.5*  MG 1.8 1.5* 2.5* 3.0* 2.5*  --   --  1.8  --   --  1.7  PHOS  --  4.4 5.2* 4.6 4.0  --   --  3.8  --   --   --    GFR: Estimated Creatinine Clearance: 101.8 mL/min (by C-G formula based on SCr of 0.64 mg/dL). Recent Labs  Lab 03/30/18 0434 03/31/18 0450 04/01/18 0417 04/02/18 0403  WBC 17.8* 14.5* 13.0* 12.2*    Liver Function  Tests: Recent Labs  Lab 03/28/18 0430  AST 22  ALT 76*  ALKPHOS 71  BILITOT 0.4  PROT 4.3*  ALBUMIN 1.5*   No results for input(s): LIPASE, AMYLASE in the last 168 hours. No results for input(s): AMMONIA in the last 168 hours.  ABG    Component Value Date/Time   PHART 7.422 04/02/2018 0450   PCO2ART 54.9 (H) 04/02/2018 0450   PO2ART 123 (H) 04/02/2018 0450   HCO3 34.8 (  H) 04/02/2018 0450   TCO2 36 (H) 04/01/2018 1030   ACIDBASEDEF 1.0 03/28/2018 0420   O2SAT 98.3 04/02/2018 0450     Coagulation Profile: No results for input(s): INR, PROTIME in the last 168 hours.  Cardiac Enzymes: No results for input(s): CKTOTAL, CKMB, CKMBINDEX, TROPONINI in the last 168 hours.  HbA1C: Hgb A1c MFr Bld  Date/Time Value Ref Range Status  02/10/2018 06:55 AM 5.0 4.8 - 5.6 % Final    Comment:    (NOTE) Pre diabetes:          5.7%-6.4% Diabetes:              >6.4% Glycemic control for   <7.0% adults with diabetes   09/17/2015 06:42 PM 5.2 4.8 - 5.6 % Final    Comment:    (NOTE)         Pre-diabetes: 5.7 - 6.4         Diabetes: >6.4         Glycemic control for adults with diabetes: <7.0     CBG: Recent Labs  Lab 04/01/18 1104 04/01/18 1659 04/01/18 1900 04/01/18 2327 04/02/18 0401  GLUCAP 102* 118* 93 101* 85     Vidyuth Belsito A, DO 04/02/2018, 6:25 AM Pager: [phone]

## 2018-04-03 ENCOUNTER — Other Ambulatory Visit: Payer: Self-pay

## 2018-04-03 ENCOUNTER — Encounter (HOSPITAL_COMMUNITY): Payer: Self-pay | Admitting: *Deleted

## 2018-04-03 LAB — CBC
HCT: 27.9 % — ABNORMAL LOW (ref 36.0–46.0)
Hemoglobin: 8.6 g/dL — ABNORMAL LOW (ref 12.0–15.0)
MCH: 28.5 pg (ref 26.0–34.0)
MCHC: 30.8 g/dL (ref 30.0–36.0)
MCV: 92.4 fL (ref 80.0–100.0)
Platelets: 410 10*3/uL — ABNORMAL HIGH (ref 150–400)
RBC: 3.02 MIL/uL — ABNORMAL LOW (ref 3.87–5.11)
RDW: 13.7 % (ref 11.5–15.5)
WBC: 10.4 10*3/uL (ref 4.0–10.5)
nRBC: 0 % (ref 0.0–0.2)

## 2018-04-03 LAB — COMPREHENSIVE METABOLIC PANEL
ALT: 46 U/L — ABNORMAL HIGH (ref 0–44)
AST: 34 U/L (ref 15–41)
Albumin: 1.8 g/dL — ABNORMAL LOW (ref 3.5–5.0)
Alkaline Phosphatase: 150 U/L — ABNORMAL HIGH (ref 38–126)
Anion gap: 11 (ref 5–15)
BUN: 16 mg/dL (ref 6–20)
CO2: 32 mmol/L (ref 22–32)
Calcium: 8.2 mg/dL — ABNORMAL LOW (ref 8.9–10.3)
Chloride: 98 mmol/L (ref 98–111)
Creatinine, Ser: 0.67 mg/dL (ref 0.44–1.00)
GFR calc Af Amer: >60 mL/min (ref >60)
GFR calc non Af Amer: >60 mL/min (ref >60)
GLUCOSE: 88 mg/dL (ref 70–99)
Potassium: 3.9 mmol/L (ref 3.5–5.1)
Sodium: 141 mmol/L (ref 135–145)
Total Bilirubin: 0.5 mg/dL (ref 0.3–1.2)
Total Protein: 5.2 g/dL — ABNORMAL LOW (ref 6.5–8.1)

## 2018-04-03 LAB — GLUCOSE, CAPILLARY
Glucose-Capillary: 85 mg/dL (ref 70–99)
Glucose-Capillary: 78 mg/dL (ref 70–99)
Glucose-Capillary: 98 mg/dL (ref 70–99)
Glucose-Capillary: 95 mg/dL (ref 70–99)
Glucose-Capillary: 74 mg/dL (ref 70–99)
Glucose-Capillary: 79 mg/dL (ref 70–99)

## 2018-04-03 LAB — MAGNESIUM: Magnesium: 2 mg/dL (ref 1.7–2.4)

## 2018-04-03 MED ORDER — LORAZEPAM 2 MG/ML IJ SOLN
INTRAMUSCULAR | Status: AC
  Administered 2018-04-03: 2 mg
  Filled 2018-04-03: qty 1

## 2018-04-03 MED ORDER — ORAL CARE MOUTH RINSE
15.0000 mL | Freq: Two times a day (BID) | OROMUCOSAL | Status: DC
  Administered 2018-04-04: 15 mL via OROMUCOSAL

## 2018-04-03 MED ORDER — QUETIAPINE FUMARATE 300 MG PO TABS
300.0000 mg | ORAL_TABLET | Freq: Two times a day (BID) | ORAL | Status: DC
  Administered 2018-04-04 – 2018-04-08 (×7): 300 mg via ORAL
  Filled 2018-04-03 (×12): qty 1

## 2018-04-03 MED ORDER — MIDAZOLAM HCL 2 MG/2ML IJ SOLN
1.0000 mg | INTRAMUSCULAR | Status: DC | PRN
  Administered 2018-04-03 (×6): 2 mg via INTRAVENOUS
  Filled 2018-04-03 (×7): qty 2

## 2018-04-03 MED ORDER — HALOPERIDOL LACTATE 5 MG/ML IJ SOLN
5.0000 mg | Freq: Once | INTRAMUSCULAR | Status: AC
  Administered 2018-04-03: 5 mg via INTRAVENOUS

## 2018-04-03 MED ORDER — HALOPERIDOL LACTATE 5 MG/ML IJ SOLN
INTRAMUSCULAR | Status: AC
  Filled 2018-04-03: qty 1

## 2018-04-03 MED ORDER — FOLIC ACID 1 MG PO TABS
1.0000 mg | ORAL_TABLET | Freq: Every day | ORAL | Status: DC
  Administered 2018-04-06 – 2018-04-07 (×2): 1 mg via ORAL
  Filled 2018-04-03 (×6): qty 1

## 2018-04-03 MED ORDER — CLONIDINE HCL 0.3 MG PO TABS
0.3000 mg | ORAL_TABLET | Freq: Four times a day (QID) | ORAL | Status: DC
  Filled 2018-04-03 (×6): qty 1

## 2018-04-03 MED ORDER — FUROSEMIDE 10 MG/ML IJ SOLN
60.0000 mg | Freq: Once | INTRAMUSCULAR | Status: AC
  Administered 2018-04-03: 60 mg via INTRAVENOUS
  Filled 2018-04-03: qty 6

## 2018-04-03 MED ORDER — MIDAZOLAM HCL 2 MG/2ML IJ SOLN
2.0000 mg | Freq: Once | INTRAMUSCULAR | Status: AC
  Administered 2018-04-03: 2 mg via INTRAVENOUS

## 2018-04-03 MED ORDER — LORAZEPAM 2 MG/ML IJ SOLN
1.0000 mg | Freq: Four times a day (QID) | INTRAMUSCULAR | Status: DC
  Administered 2018-04-03 – 2018-04-07 (×17): 1 mg via INTRAVENOUS
  Filled 2018-04-03 (×18): qty 1

## 2018-04-03 MED ORDER — HALOPERIDOL LACTATE 5 MG/ML IJ SOLN
5.0000 mg | Freq: Four times a day (QID) | INTRAMUSCULAR | Status: AC
  Administered 2018-04-03 – 2018-04-04 (×3): 5 mg via INTRAVENOUS
  Filled 2018-04-03 (×3): qty 1

## 2018-04-03 MED ORDER — POTASSIUM CHLORIDE 20 MEQ/15ML (10%) PO SOLN
40.0000 meq | Freq: Once | ORAL | Status: AC
  Administered 2018-04-03: 40 meq via ORAL
  Filled 2018-04-03: qty 30

## 2018-04-03 MED ORDER — LORAZEPAM 1 MG PO TABS
1.0000 mg | ORAL_TABLET | Freq: Once | ORAL | Status: AC
  Administered 2018-04-03: 1 mg via ORAL
  Filled 2018-04-03: qty 1

## 2018-04-03 NOTE — Procedures (Signed)
Extubation Procedure Note  Patient Details:   Name: Lindsay Lara DOB: August 06, 2000 MRN: [id number]   Airway Documentation:    Vent end date: 04/03/18 Vent end time: 0807   Evaluation  O2 sats: stable throughout Complications: No apparent complications Patient did tolerate procedure well. Bilateral Breath Sounds: Diminished(coarse)   Pt extubated per MD order. Pt had +cuff leak. Placed on Bipap per MD post extubation. Pt has increased anxiety & inc RR. RN & MD at bedside. RT will continue to monitor   Lindsay Lara, Lindsay Lara 04/03/2018, 8:18 AM

## 2018-04-03 NOTE — Progress Notes (Signed)
eLink Physician-Brief Progress Note Patient Name: Lindsay Lara DOB: 2000-09-02 MRN: [id number]   Date of Service  04/03/2018  HPI/Events of Note  Extreme agitation with concern for self extubation despite max doses of Precedex, Dilaudid and Propofol. Patient is a drug abuser with likely tolerance to usual doses of sedating medications.  eICU Interventions  Versed increased to 1-2 mg iv Q 1 hour prn agitation        Zeya Balles U Dashae Wilcher 04/03/2018, 12:04 AM

## 2018-04-03 NOTE — Progress Notes (Signed)
Per MD, pt to wear bipap Continuously, however pt unable to tolerate at this time and is attempting to remove mask frequently. Pt on 6L HFNC at this time. VS within normal limits. Slight increased RR but due to anxiety. RT will continue to monitor and attempt to place pt back on when tolerating better

## 2018-04-03 NOTE — Progress Notes (Signed)
Changed patients BIPAP mask from a medium to a small due to patient having issues with mask etc.  The small is a much better fit for patient.  RT will continue to monitor.

## 2018-04-03 NOTE — Progress Notes (Signed)
NAME:  Lindsay Lara, MRN:  [id number], DOB:  [birth year], LOS: 13 ADMISSION DATE:  03/21/2018, CONSULTATION DATE:  03/21/2018 REFERRING MD:  Dr. [name] Carl CHIEF COMPLAINT:  Altered Mental Status    Brief History   18 yr old female w/ PMHx ADHD, Anxiety, Bipolar depression,previous suicide attempts,Polysubstance abuse (cocaine, Marijuana,heroin and ETOH) presentsvia EMS after Mom found pt unresponsive. Bradycardic to 40sandhypotensive with SBP in the 40s. Intubated.Given Glucagon.Started on epi gtt. PCCM consulted for Intentional OD (propanolol, xanax and Seroquel)  Significant Hospital Events   1/16>Admission  1/16>Seen by Neurology - MRI for possible lateralizing signs  Consults:  PCCM Neurology   Procedures:  1/16> Intubated 1/16>CVL placed  1/17> chest tube placed 1/18>Bedside bronchoscopy , Therapeutic suctioning  1/19>extubated>reintubated 1/24>extubated 1/24>reintubated    Significant Diagnostic Tests:  CT head: Unremarkable  EKG: RBBB, Prolonged QTc 528 EEG: possible right focal cerebral dysfunction. No clear seizure activity. MRI: Negative for acute infarct or mass, Question diffuse cortical edema right hemisphere on axial T2 images.While this could be due to artifact, consider seizure related cortical edema. EEG 1/23: no seizure activity   Micro Data:  1/18 Urine Cx> 1/18 Urinalysis> 1/18 Blood Cx> 1/18 Tracheal Aspirate Cx> Rare GNR, pending sensitivity/cx  1/21 Repeat Resp culture > rare candida  1/24 Sputum > normal resp flora  1/25 Blood > NGTD  Antimicrobials:  1/18 Zosyn>1/19 1/18 Vancomycin>1/19 1/19 Unasyn> 1/21 1/21 Ampicillin > 1/23 1/23 Unasyn > 1/26 1/26 Vanc/Cefepime   Interim history/subjective:   agitation from yesterday.   Objective   Blood pressure (!) 106/56, pulse 74, temperature 99.8 F (37.7 C), temperature source Oral, resp. rate (!) 28, height 5\' 5"  (1.651 m), weight 58.4 kg, last menstrual period 03/21/2018, SpO2 98 %.   Vent Mode: PRVC FiO2 (%):  [40 %-50 %] 40 % Set Rate:  [28 bmp] 28 bmp Vt Set:  [340 mL] 340 mL PEEP:  [8 cmH20-10 cmH20] 8 cmH20 Plateau Pressure:  [17 cmH20-28 cmH20] 25 cmH20   Intake/Output Summary (Last 24 hours) at 04/03/2018 9323 Last data filed at 04/03/2018 0600 Gross per 24 hour  Intake 2485.95 ml  Output 6125 ml  Net -3639.05 ml   Filed Weights   03/31/18 0235 04/01/18 0345 04/02/18 0500  Weight: 58 kg 56.7 kg 58.4 kg    Examination:  General: awake, NAD Eyes: pupils reactive ENT: ETT in place, AT/[place] Cardiac:  regular rhythm, no murmur Pulmonary: bilateral lower lobe wheezing and rales Abdomen: soft, non tender, hypoactive BS Extremities: no cyanosis, clubbing, or edema Skin: c/d/i  Neuro: RASS -1  Resolved Hospital Problem list   Left apical Pneumothorax    Assessment & Plan:  18 yr old female w/ PMHx ADHD, Anxiety, Bipolar depression,previous suicide attempts,Polysubstance abuse (cocaine, Marijuana,heroin and ETOH) presentsfollowing intentional drug overdose with Propranolol and Seroquel. Utox positive for benzos and THC.   Acute respiratory failure secondary to respiratory depression from intentional overdose  ARDs 2/2 HAP/Aspiration Pneumonia   Plan - lasix 60mg , plan to extubate today  - replete K - cont.cefepime yesterday  - cont. ARDs protocol, 6mg /kg  - daily cxr, cbc, bmp, mag - prn BDs - f/u blood & resp cultures - NGTDx2days - cont. Tube feeds  - echo ordered   Acute Encephalopathy at Admission Alcohol use disorder Marijuana use disorder Opioid use disorder Cocaine use disorder ADHD, bipolar disorder depression, anxiety disorder, previous suicidal attempt Plan Decreasing sedation  - wean Versed, propofol, dilaudid gtt - continue atarax 25 q8h, seroquel 300 qhs - bolus versed  prn   High risk behavior Labial Swelling Plan - continue acyclovir  - Known history of genital herpes  Constipation Plan - glycerin suppository prn    - sennokot scheduled   Best practice:  Diet:tube feeds DVT prophylaxis:Lovenox GI prophylaxis:protonix 40qd Mobility: bed rest Code Status:FULL Family Communication: mom at bedside   Labs   CBC: Recent Labs  Lab 03/30/18 0434 03/31/18 0450  03/31/18 1717 04/01/18 0417 04/01/18 1030 04/02/18 0403 04/03/18 0405  WBC 17.8* 14.5*  --   --  13.0*  --  12.2* 10.4  NEUTROABS 14.3*  --   --   --   --   --   --   --   HGB 9.5* 8.3*   < > 8.8* 8.2* 8.8* 9.0* 8.6*  HCT 30.6* 27.5*   < > 26.0* 27.8* 26.0* 28.8* 27.9*  MCV 92.4 96.2  --   --  96.9  --  94.1 92.4  PLT 152 186  --   --  242  --  340 410*   < > = values in this interval not displayed.    Basic Metabolic Panel: Recent Labs  Lab 03/30/18 0434 03/30/18 0841 03/30/18 1838 03/31/18 0450  03/31/18 1726 04/01/18 0417 04/01/18 1030 04/02/18 0403 04/02/18 1820 04/03/18 0405  NA 145  --   --  144   < >  --  141 142 139 139 141  K 3.2*  --   --  4.5   < >  --  4.2 3.8 3.4* 3.6 3.9  CL 107  --   --  109  --   --  105  --  99 96* 98  CO2 26  --   --  27  --   --  28  --  31 34* 32  GLUCOSE 84  --   --  101*  --   --  125*  --  94 81 88  BUN 7  --   --  14  --   --  14  --  12 12 16   CREATININE 0.96  --   --  0.71  --   --  0.63  --  0.64 0.58 0.67  CALCIUM 7.1*  --   --  7.4*  --   --  7.6*  --  7.5* 8.1* 8.2*  MG 1.5* 2.5* 3.0* 2.5*  --  1.8  --   --  1.7  --  2.0  PHOS 4.4 5.2* 4.6 4.0  --  3.8  --   --   --   --   --    < > = values in this interval not displayed.   GFR: Estimated Creatinine Clearance: 101.8 mL/min (by C-G formula based on SCr of 0.67 mg/dL). Recent Labs  Lab 03/31/18 0450 04/01/18 0417 04/02/18 0403 04/03/18 0405  WBC 14.5* 13.0* 12.2* 10.4    Liver Function Tests: Recent Labs  Lab 03/28/18 0430 04/03/18 0405  AST 22 34  ALT 76* 46*  ALKPHOS 71 150*  BILITOT 0.4 0.5  PROT 4.3* 5.2*  ALBUMIN 1.5* 1.8*   No results for input(s): LIPASE, AMYLASE in the last 168 hours. No  results for input(s): AMMONIA in the last 168 hours.  ABG    Component Value Date/Time   PHART 7.422 04/02/2018 0450   PCO2ART 54.9 (H) 04/02/2018 0450   PO2ART 123 (H) 04/02/2018 0450   HCO3 34.8 (H) 04/02/2018 0450   TCO2 36 (H) 04/01/2018 1030   ACIDBASEDEF  1.0 03/28/2018 0420   O2SAT 98.3 04/02/2018 0450     Coagulation Profile: No results for input(s): INR, PROTIME in the last 168 hours.  Cardiac Enzymes: No results for input(s): CKTOTAL, CKMB, CKMBINDEX, TROPONINI in the last 168 hours.  HbA1C: Hgb A1c MFr Bld  Date/Time Value Ref Range Status  02/10/2018 06:55 AM 5.0 4.8 - 5.6 % Final    Comment:    (NOTE) Pre diabetes:          5.7%-6.4% Diabetes:              >6.4% Glycemic control for   <7.0% adults with diabetes   09/17/2015 06:42 PM 5.2 4.8 - 5.6 % Final    Comment:    (NOTE)         Pre-diabetes: 5.7 - 6.4         Diabetes: >6.4         Glycemic control for adults with diabetes: <7.0     CBG: Recent Labs  Lab 04/02/18 0720 04/02/18 1056 04/02/18 1513 04/02/18 2348 04/03/18 0507  GLUCAP 107* 112* 111* 81 85     Zelena Bushong A, DO 04/03/2018, 6:13 AM Pager: [phone]

## 2018-04-03 NOTE — Progress Notes (Signed)
77ml's of versed wasted via stericyle, witnessed by Morrie Sheldon, Charity fundraiser.

## 2018-04-04 ENCOUNTER — Inpatient Hospital Stay (HOSPITAL_COMMUNITY): Payer: PRIVATE HEALTH INSURANCE

## 2018-04-04 LAB — POCT I-STAT 7, (LYTES, BLD GAS, ICA,H+H)
Acid-Base Excess: 8 mmol/L — ABNORMAL HIGH (ref 0.0–2.0)
Acid-Base Excess: 8 mmol/L — ABNORMAL HIGH (ref 0.0–2.0)
Bicarbonate: 32.8 mmol/L — ABNORMAL HIGH (ref 20.0–28.0)
Bicarbonate: 31.1 mmol/L — ABNORMAL HIGH (ref 20.0–28.0)
Calcium, Ion: 1.19 mmol/L (ref 1.15–1.40)
Calcium, Ion: 1.19 mmol/L (ref 1.15–1.40)
HCT: 30 % — ABNORMAL LOW (ref 36.0–46.0)
HCT: 31 % — ABNORMAL LOW (ref 36.0–46.0)
Hemoglobin: 10.2 g/dL — ABNORMAL LOW (ref 12.0–15.0)
Hemoglobin: 10.5 g/dL — ABNORMAL LOW (ref 12.0–15.0)
O2 Saturation: 93 %
O2 Saturation: 97 %
Patient temperature: 99.4
Patient temperature: 98
Potassium: 3.9 mmol/L (ref 3.5–5.1)
Potassium: 3.9 mmol/L (ref 3.5–5.1)
Sodium: 135 mmol/L (ref 135–145)
Sodium: 135 mmol/L (ref 135–145)
TCO2: 34 mmol/L — ABNORMAL HIGH (ref 22–32)
TCO2: 32 mmol/L (ref 22–32)
pCO2 arterial: 48.4 mmHg — ABNORMAL HIGH (ref 32.0–48.0)
pCO2 arterial: 37.9 mmHg (ref 32.0–48.0)
pH, Arterial: 7.441 (ref 7.350–7.450)
pH, Arterial: 7.522 — ABNORMAL HIGH (ref 7.350–7.450)
pO2, Arterial: 68 mmHg — ABNORMAL LOW (ref 83.0–108.0)
pO2, Arterial: 81 mmHg — ABNORMAL LOW (ref 83.0–108.0)

## 2018-04-04 LAB — MAGNESIUM: Magnesium: 2.2 mg/dL (ref 1.7–2.4)

## 2018-04-04 LAB — CBC
HEMATOCRIT: 31.4 % — AB (ref 36.0–46.0)
Hemoglobin: 10.1 g/dL — ABNORMAL LOW (ref 12.0–15.0)
MCH: 29.6 pg (ref 26.0–34.0)
MCHC: 32.2 g/dL (ref 30.0–36.0)
MCV: 92.1 fL (ref 80.0–100.0)
Platelets: 560 10*3/uL — ABNORMAL HIGH (ref 150–400)
RBC: 3.41 MIL/uL — ABNORMAL LOW (ref 3.87–5.11)
RDW: 13.1 % (ref 11.5–15.5)
WBC: 12.3 10*3/uL — ABNORMAL HIGH (ref 4.0–10.5)
nRBC: 0 % (ref 0.0–0.2)

## 2018-04-04 LAB — CULTURE, BLOOD (ROUTINE X 2)
Culture: NO GROWTH
Culture: NO GROWTH

## 2018-04-04 LAB — BASIC METABOLIC PANEL
Anion gap: 11 (ref 5–15)
BUN: 14 mg/dL (ref 6–20)
CHLORIDE: 99 mmol/L (ref 98–111)
CO2: 29 mmol/L (ref 22–32)
Calcium: 8.5 mg/dL — ABNORMAL LOW (ref 8.9–10.3)
Creatinine, Ser: 0.68 mg/dL (ref 0.44–1.00)
GFR calc Af Amer: >60 mL/min (ref >60)
GFR calc non Af Amer: >60 mL/min (ref >60)
Glucose, Bld: 83 mg/dL (ref 70–99)
POTASSIUM: 4 mmol/L (ref 3.5–5.1)
Sodium: 139 mmol/L (ref 135–145)

## 2018-04-04 LAB — GLUCOSE, CAPILLARY
GLUCOSE-CAPILLARY: 84 mg/dL (ref 70–99)
Glucose-Capillary: 82 mg/dL (ref 70–99)
Glucose-Capillary: 97 mg/dL (ref 70–99)

## 2018-04-04 LAB — TRIGLYCERIDES: Triglycerides: 311 mg/dL — ABNORMAL HIGH (ref <150)

## 2018-04-04 MED ORDER — FUROSEMIDE 10 MG/ML IJ SOLN
60.0000 mg | Freq: Once | INTRAMUSCULAR | Status: AC
  Administered 2018-04-04: 60 mg via INTRAVENOUS
  Filled 2018-04-04: qty 6

## 2018-04-04 MED ORDER — IPRATROPIUM BROMIDE 0.02 % IN SOLN
0.5000 mg | Freq: Four times a day (QID) | RESPIRATORY_TRACT | Status: DC
  Administered 2018-04-04 – 2018-04-06 (×10): 0.5 mg via RESPIRATORY_TRACT
  Filled 2018-04-04 (×10): qty 2.5

## 2018-04-04 MED ORDER — MORPHINE SULFATE (PF) 2 MG/ML IV SOLN
INTRAVENOUS | Status: AC
  Administered 2018-04-04: 2 mg via INTRAVENOUS
  Filled 2018-04-04: qty 1

## 2018-04-04 MED ORDER — FENTANYL CITRATE (PF) 100 MCG/2ML IJ SOLN
INTRAMUSCULAR | Status: AC
  Filled 2018-04-04: qty 4

## 2018-04-04 MED ORDER — MIDAZOLAM HCL 2 MG/2ML IJ SOLN
INTRAMUSCULAR | Status: AC
  Filled 2018-04-04: qty 4

## 2018-04-04 MED ORDER — SENNOSIDES 8.8 MG/5ML PO SYRP
5.0000 mL | ORAL_SOLUTION | Freq: Two times a day (BID) | ORAL | Status: DC | PRN
  Administered 2018-04-05 – 2018-04-06 (×2): 5 mL
  Filled 2018-04-04 (×3): qty 5

## 2018-04-04 MED ORDER — DEXMEDETOMIDINE HCL IN NACL 400 MCG/100ML IV SOLN
0.4000 ug/kg/h | INTRAVENOUS | Status: DC
  Administered 2018-04-04 (×5): 2 ug/kg/h via INTRAVENOUS
  Administered 2018-04-05: 1.8 ug/kg/h via INTRAVENOUS
  Administered 2018-04-05 (×2): 2 ug/kg/h via INTRAVENOUS
  Administered 2018-04-05: 1.8 ug/kg/h via INTRAVENOUS
  Administered 2018-04-05: 2 ug/kg/h via INTRAVENOUS
  Administered 2018-04-05: 2.004 ug/kg/h via INTRAVENOUS
  Administered 2018-04-06 (×2): 1.8 ug/kg/h via INTRAVENOUS
  Administered 2018-04-06: 1 ug/kg/h via INTRAVENOUS
  Administered 2018-04-06: 0.8 ug/kg/h via INTRAVENOUS
  Administered 2018-04-07: 1.5 ug/kg/h via INTRAVENOUS
  Administered 2018-04-07: 0.6 ug/kg/h via INTRAVENOUS
  Administered 2018-04-08: 2 ug/kg/h via INTRAVENOUS
  Administered 2018-04-08 (×3): 1.4 ug/kg/h via INTRAVENOUS
  Administered 2018-04-08: 1.6 ug/kg/h via INTRAVENOUS
  Administered 2018-04-09: 1.2 ug/kg/h via INTRAVENOUS
  Administered 2018-04-09: 2 ug/kg/h via INTRAVENOUS
  Administered 2018-04-09: 1.4 ug/kg/h via INTRAVENOUS
  Administered 2018-04-09 (×2): 2 ug/kg/h via INTRAVENOUS
  Administered 2018-04-10: 1.4 ug/kg/h via INTRAVENOUS
  Administered 2018-04-10: 1.7 ug/kg/h via INTRAVENOUS
  Filled 2018-04-04 (×28): qty 100

## 2018-04-04 MED ORDER — HALOPERIDOL LACTATE 5 MG/ML IJ SOLN
5.0000 mg | Freq: Once | INTRAMUSCULAR | Status: AC
  Administered 2018-04-04: 5 mg via INTRAVENOUS
  Filled 2018-04-04: qty 1

## 2018-04-04 MED ORDER — HALOPERIDOL LACTATE 5 MG/ML IJ SOLN: 5.0000 mg | Freq: Four times a day (QID) | INTRAMUSCULAR | Status: DC | PRN

## 2018-04-04 MED ORDER — ROCURONIUM BROMIDE 50 MG/5ML IV SOLN
100.0000 mg | Freq: Once | INTRAVENOUS | Status: AC
  Administered 2018-04-04: 100 mg via INTRAVENOUS
  Filled 2018-04-04: qty 10

## 2018-04-04 MED ORDER — ETOMIDATE 2 MG/ML IV SOLN
20.0000 mg | Freq: Once | INTRAVENOUS | Status: AC
  Administered 2018-04-04: 20 mg via INTRAVENOUS

## 2018-04-04 MED ORDER — HALOPERIDOL LACTATE 5 MG/ML IJ SOLN
INTRAMUSCULAR | Status: AC
  Filled 2018-04-04: qty 1

## 2018-04-04 MED ORDER — ETOMIDATE 2 MG/ML IV SOLN
40.0000 mg | Freq: Once | INTRAVENOUS | Status: AC
  Administered 2018-04-05: 20 mg via INTRAVENOUS

## 2018-04-04 MED ORDER — FENTANYL CITRATE (PF) 100 MCG/2ML IJ SOLN: 50.0000 ug | Freq: Once | INTRAMUSCULAR | Status: AC

## 2018-04-04 MED ORDER — CLONIDINE HCL 0.3 MG PO TABS
0.3000 mg | ORAL_TABLET | Freq: Four times a day (QID) | ORAL | Status: DC
  Administered 2018-04-05 – 2018-04-08 (×12): 0.3 mg via ORAL
  Filled 2018-04-04 (×17): qty 1

## 2018-04-04 MED ORDER — MIDAZOLAM HCL 2 MG/2ML IJ SOLN
5.0000 mg | Freq: Once | INTRAMUSCULAR | Status: AC
  Administered 2018-04-05: 2 mg via INTRAVENOUS
  Filled 2018-04-04: qty 6

## 2018-04-04 MED ORDER — FENTANYL CITRATE (PF) 100 MCG/2ML IJ SOLN
INTRAMUSCULAR | Status: AC
  Administered 2018-04-04: 50 ug
  Filled 2018-04-04: qty 4

## 2018-04-04 MED ORDER — MIDAZOLAM HCL 2 MG/2ML IJ SOLN: 1.0000 mg | Freq: Once | INTRAMUSCULAR | Status: AC

## 2018-04-04 MED ORDER — NOREPINEPHRINE-SODIUM CHLORIDE 4-0.9 MG/250ML-% IV SOLN
INTRAVENOUS | Status: AC
  Filled 2018-04-04: qty 250

## 2018-04-04 MED ORDER — MORPHINE SULFATE (PF) 2 MG/ML IV SOLN
2.0000 mg | Freq: Once | INTRAVENOUS | Status: AC
  Administered 2018-04-04: 2 mg via INTRAVENOUS

## 2018-04-04 MED ORDER — PROPOFOL 1000 MG/100ML IV EMUL
0.0000 ug/kg/min | INTRAVENOUS | Status: DC
  Administered 2018-04-04 – 2018-04-05 (×3): 50 ug/kg/min via INTRAVENOUS
  Administered 2018-04-05: 45 ug/kg/min via INTRAVENOUS
  Administered 2018-04-05 – 2018-04-06 (×2): 50 ug/kg/min via INTRAVENOUS
  Filled 2018-04-04 (×5): qty 100

## 2018-04-04 MED ORDER — CLONIDINE ORAL SUSPENSION 10 MCG/ML
0.3000 mg | Freq: Four times a day (QID) | ORAL | Status: DC
  Filled 2018-04-04 (×2): qty 30

## 2018-04-04 MED ORDER — HALOPERIDOL LACTATE 5 MG/ML IJ SOLN
5.0000 mg | Freq: Four times a day (QID) | INTRAMUSCULAR | Status: DC | PRN
  Administered 2018-04-05 – 2018-04-15 (×12): 5 mg via INTRAVENOUS
  Filled 2018-04-04 (×14): qty 1

## 2018-04-04 MED ORDER — FENTANYL CITRATE (PF) 100 MCG/2ML IJ SOLN
200.0000 ug | Freq: Once | INTRAMUSCULAR | Status: DC
  Filled 2018-04-04: qty 4

## 2018-04-04 MED ORDER — VECURONIUM BROMIDE 10 MG IV SOLR
10.0000 mg | Freq: Once | INTRAVENOUS | Status: AC
  Administered 2018-04-05: 10 mg via INTRAVENOUS

## 2018-04-04 MED ORDER — PROPOFOL 10 MG/ML IV BOLUS
500.0000 mg | Freq: Once | INTRAVENOUS | Status: AC
  Administered 2018-04-05 (×2): 50 mg via INTRAVENOUS

## 2018-04-04 MED ORDER — CLONIDINE HCL 0.3 MG/24HR TD PTWK
0.3000 mg | MEDICATED_PATCH | TRANSDERMAL | Status: DC
  Administered 2018-04-04: 0.3 mg via TRANSDERMAL
  Filled 2018-04-04: qty 1

## 2018-04-04 MED ORDER — HALOPERIDOL LACTATE 5 MG/ML IJ SOLN
5.0000 mg | Freq: Four times a day (QID) | INTRAMUSCULAR | Status: DC | PRN
  Administered 2018-04-04 (×3): 5 mg via INTRAVENOUS
  Filled 2018-04-04 (×2): qty 1

## 2018-04-04 MED ORDER — PROPOFOL 1000 MG/100ML IV EMUL
INTRAVENOUS | Status: AC
  Filled 2018-04-04: qty 100

## 2018-04-04 MED ORDER — MIDAZOLAM HCL 2 MG/2ML IJ SOLN
INTRAMUSCULAR | Status: AC
  Administered 2018-04-04: 1 mg
  Filled 2018-04-04: qty 4

## 2018-04-04 NOTE — Evaluation (Signed)
Clinical/Bedside Swallow Evaluation Patient Details  Name: Lindsay Lara E Vannatter MRN: [id number] Date of Birth: 04-Mar-2001  Today's Date: 04/04/2018 Time: SLP Start Time (ACUTE ONLY): 1440 SLP Stop Time (ACUTE ONLY): 1452 SLP Time Calculation (min) (ACUTE ONLY): 12 min  Past Medical History:  Past Medical History:  Diagnosis Date  . Acne   . ADHD (attention deficit hyperactivity disorder) 05/08/2012  . Anxiety   . Bipolar and related disorder (HCC) 09/18/2015  . Depression   . 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 Pneumonia at age 89  . Polysubstance abuse (HCC) 09/18/2015  . Scoliosis no treatment required  . Substance induced mood disorder (HCC) 09/18/2015   Past Surgical History: History reviewed. No pertinent surgical history. HPI:  18 yr old female w/ PMHx ADHD, Anxiety, Bipolar depression, previous suicide attempts, Polysubstance abuse (cocaine, Marijuana, heroin and ETOH) presents via EMS after Mom found pt unresponsive. Bradycardic and hypotensive. Intubated 1/16-1/20, reintubated several hours later 1/20- 1/24 (self extubated), reintubated 1/24-1/29. CXR 1/28 Stable hardware positioning and bilateral pneumonia.   Assessment / Plan / Recommendation Clinical Impression  Pt exhibits pharyngeal dysphagia impacted by intubation x 2, decreased coordination of swallow and respiration with degree of anxiety. Significantly dysphonic vocal quality with hoarseness, low intensity and weak cough. Trials of thin were followed by immediate inhalation with increasing aspiration during the swallow. Delayed throat clear x 1. Pt should remain NPO with allowing several ice chips intermittently after oral care until instrumental assessment performed. Described FEES vs MBS with benefits to observe vocal cords with FEES. Pt's mom prefers MBS (anxiety likely too high for anxiety).    SLP Visit Diagnosis: Dysphagia, unspecified (R13.10)    Aspiration Risk  Severe aspiration risk    Diet  Recommendation Ice chips PRN after oral care(NPO except ice chips)        Other  Recommendations Oral Care Recommendations: Oral care QID   Follow up Recommendations Other (comment)(TBD)      Frequency and Duration            Prognosis        Swallow Study   General HPI: 18 yr old female w/ PMHx ADHD, Anxiety, Bipolar depression, previous suicide attempts, Polysubstance abuse (cocaine, Marijuana, heroin and ETOH) presents via EMS after Mom found pt unresponsive. Bradycardic and hypotensive. Intubated 1/16-1/20, reintubated several hours later 1/20- 1/24 (self extubated), reintubated 1/24-1/29. CXR 1/28 Stable hardware positioning and bilateral pneumonia. Type of Study: Bedside Swallow Evaluation Previous Swallow Assessment: (none) Diet Prior to this Study: NPO Temperature Spikes Noted: Yes Respiratory Status: Room air History of Recent Intubation: Yes Length of Intubations (days): (see HPI) Date extubated: (see HPI) Behavior/Cognition: Alert;Other (Comment);Cooperative;Requires cueing(slightly anxious) Oral Cavity Assessment: Within Functional Limits Oral Care Completed by SLP: Recent completion by staff Oral Cavity - Dentition: Adequate natural dentition Vision: Functional for self-feeding Self-Feeding Abilities: Able to feed self Patient Positioning: Upright in bed Baseline Vocal Quality: Hoarse;Low vocal intensity Volitional Cough: Weak Volitional Swallow: Able to elicit    Oral/Motor/Sensory Function Overall Oral Motor/Sensory Function: Within functional limits   Ice Chips Ice chips: Impaired Presentation: Spoon Oral Phase Impairments: (WFL) Oral Phase Functional Implications: (WFL) Pharyngeal Phase Impairments: Throat Clearing - Immediate   Thin Liquid Thin Liquid: Impaired Presentation: Cup Oral Phase Impairments: (WFL) Pharyngeal  Phase Impairments: Throat Clearing - Immediate(inhalation post swallow)    Nectar Thick Nectar Thick Liquid: Not tested   Honey  Thick Honey Thick Liquid: Not tested   Puree Puree: Impaired Presentation:  Self Fed;Spoon Oral Phase Impairments: [facility]) Oral Phase Functional Implications: (WFL) Pharyngeal Phase Impairments: Other (comments)(decreased coor swallow/breathe)   Solid     Solid: Not tested      Royce Macadamia 04/04/2018,3:14 PM  Breck Coons [place].Ed Nurse, children's [phone] Office [phone]

## 2018-04-04 NOTE — Progress Notes (Signed)
Pt taken off bipap at this time and placed on 6L HFNC. No distress, no increased WOB, VS within normal limits

## 2018-04-04 NOTE — Progress Notes (Signed)
PT Cancellation Note  Patient Details Name: Lindsay Lara MRN: [id number] DOB: Jul 07, 2000   Cancelled Treatment:    Reason Eval/Treat Not Completed: Patient not medically ready Patient not medically ready(Pt with increased WOB, currently on Bipap)  Etta GrandchildSean Jerusha Reising, PT, DPT Acute Rehabilitation Services Pager: [phone] Office: [phone]     Etta GrandchildSean Chanice Brenton 04/04/2018, 9:32 AM

## 2018-04-04 NOTE — Progress Notes (Signed)
OT Cancellation Note  Patient Details Name: Lindsay Lara MRN: [id number] DOB: 23-Mar-2000   Cancelled Treatment:    Reason Eval/Treat Not Completed: Patient not medically ready(Pt with increased WOB, currently on Bipap.)  Evern Bio 04/04/2018, 9:31 AM  Martie Round, OTR/L Acute Rehabilitation Services Pager: [phone] Office: [phone]

## 2018-04-04 NOTE — Progress Notes (Signed)
Attempted to reach speech therapy r/t SLP consult.

## 2018-04-04 NOTE — Procedures (Signed)
Intubation Procedure Note Lindsay Lara [id number] 2000/12/16  Procedure: Intubation Indications: Respiratory insufficiency  Procedure Details Consent: Risks of procedure as well as the alternatives and risks of each were explained to the (patient/caregiver).  Consent for procedure obtained. Time Out: Verified patient identification, verified procedure, site/side was marked, verified correct patient position, special equipment/implants available, medications/allergies/relevent history reviewed, required imaging and test results available.  Performed  Maximum sterile technique was used including gloves, hand hygiene and mask.   Sedation: Versed 1 mg Fentanyl 50 mcg Etomidate 20 mg Roc 20 mg  Initial airway visualization with bronchoscope demonstrated grade I view with edematous larynx with minimal airway opening. Patient became hypoxemic and was BVM until sats recovered. After paralyzing, glidescope with MAC 3 inserted and demonstrated grade I view. Visualized bougie passing between vocal cords. 6.0 ETT was guided into airway via bougie. CO2 detector positive for color change.   Evaluation Hemodynamic Status: BP stable throughout; O2 sats: transiently fell during during procedure Patient's Current Condition: stable Complications: No apparent complications Patient did tolerate procedure well. Chest X-ray ordered to verify placement.  CXR: pending.   Lindsay Lara 04/04/2018

## 2018-04-04 NOTE — Progress Notes (Signed)
NAME:  Lindsay Lara, MRN:  [id number], DOB:  [birth year], LOS: 14 ADMISSION DATE:  03/21/2018, CONSULTATION DATE:  03/21/2018 REFERRING MD:  Dr. [name] Carl CHIEF COMPLAINT:  Altered Mental Status    Brief History   18 yr old female w/ PMHx ADHD, Anxiety, Bipolar depression,previous suicide attempts,Polysubstance abuse (cocaine, Marijuana,heroin and ETOH) presentsvia EMS after Mom found pt unresponsive. Bradycardic to 40sandhypotensive with SBP in the 40s. Intubated.Given Glucagon.Started on epi gtt. PCCM consulted for Intentional OD (propanolol, xanax and Seroquel)  Significant Hospital Events   1/16>Admission  1/16>Seen by Neurology - MRI for possible lateralizing signs  Consults:  PCCM Neurology   Procedures:  1/16> Intubated 1/16>CVL placed  1/17> chest tube placed 1/18>Bedside bronchoscopy , Therapeutic suctioning  1/19>extubated>reintubated 1/24>extubated 1/24>reintubated  1/29> extubated    Significant Diagnostic Tests:  CT head: Unremarkable  EKG: RBBB, Prolonged QTc 528 EEG: possible right focal cerebral dysfunction. No clear seizure activity. MRI: Negative for acute infarct or mass, Question diffuse cortical edema right hemisphere on axial T2 images.While this could be due to artifact, consider seizure related cortical edema. EEG 1/23: no seizure activity   Micro Data:  1/18 Urine Cx> 1/18 Urinalysis> 1/18 Blood Cx> 1/18 Tracheal Aspirate Cx> Rare GNR, pending sensitivity/cx  1/21 Repeat Resp culture > rare candida  1/24 Sputum > normal resp flora  1/25 Blood > NGTD  Antimicrobials:  1/18 Zosyn>1/19 1/18 Vancomycin>1/19 1/19 Unasyn> 1/21 1/21 Ampicillin > 1/23 1/23 Unasyn > 1/26 1/26 Vanc/Cefepime   Interim history/subjective:  Extubated yesterday. Having panic attacks frequently on max dilaudid and precedex. Following commands, oriented and awake.   Objective   Blood pressure 135/73, pulse (!) 106, temperature 99.9 F (37.7 C), temperature  source Oral, resp. rate (!) 29, height 5\' 5"  (1.651 m), weight 50.3 kg, last menstrual period 03/21/2018, SpO2 97 %.    Vent Mode: BIPAP FiO2 (%):  [40 %-50 %] 50 % Set Rate:  [8 bmp] 8 bmp PEEP:  [7 cmH20-8 cmH20] 7 cmH20 Pressure Support:  [5 cmH20] 5 cmH20   Intake/Output Summary (Last 24 hours) at 04/04/2018 0616 Last data filed at 04/04/2018 0500 Gross per 24 hour  Intake 1349.51 ml  Output 1650 ml  Net -300.49 ml   Filed Weights   04/02/18 0500 04/03/18 0500 04/04/18 0500  Weight: 58.4 kg 53.8 kg 50.3 kg    Examination:  General: acute distress, hyperventilating, supine in bed Eyes: pupils reactive, eom intact  ENT: New  in place, [place]/AT Cardiac: tachycardic, regular rhythm, no m/r/g  Pulmonary: CTA, no w/r/r  Abdomen: soft, non tender, hypoactive BS Extremities: no cyanosis, clubbing, or edema Skin: c/d/i  Neuro: alert & oriented, following commands, hyperventilating and anxious   Resolved Hospital Problem list   Left apical Pneumothorax    Assessment & Plan:  18 yr old female w/ PMHx ADHD, Anxiety, Bipolar depression,previous suicide attempts,Polysubstance abuse (cocaine, Marijuana,heroin and ETOH) presentsfollowing intentional drug overdose with Propranolol and Seroquel. Utox positive for benzos and THC.   Acute respiratory failure secondary to respiratory depression from intentional overdose  ARDs 2/2 HAP/Aspiration Pneumonia   Extubated yesterday. Bipap for 24 hours - transition off bipap today.  - complete cefepime today  - prn BDs - SLP eval this morning   Acute Encephalopathy at Admission Opioid, benzo, alcohol use disorder ADHD, bipolar disorder depression, anxiety disorder, previous suicidal attempt Anxiety Requiring extra doses of haldol for acute panic attacks. On dilaudid 6 gtt and precedex 2 gtt - psych consult today  - haldol q6h prn  -  continue atarax 25 q8h, seroquel 300 qhs after SLP eval  - cont. Clonidine .3mg  q6h  High risk  behavior Plan - continue acyclovir prophylaxis  - Known history of genital herpes  Constipation - glycerin suppository prn  - sennokot scheduled when able to take po   Best practice:  Diet:NPO, slp eval today  DVT prophylaxis:Lovenox GI prophylaxis:none Mobility: bed rest Code Status:FULL Family Communication: mom at bedside   Labs   CBC: Recent Labs  Lab 03/30/18 0434 03/31/18 0450  03/31/18 1717 04/01/18 0417 04/01/18 1030 04/02/18 0403 04/03/18 0405  WBC 17.8* 14.5*  --   --  13.0*  --  12.2* 10.4  NEUTROABS 14.3*  --   --   --   --   --   --   --   HGB 9.5* 8.3*   < > 8.8* 8.2* 8.8* 9.0* 8.6*  HCT 30.6* 27.5*   < > 26.0* 27.8* 26.0* 28.8* 27.9*  MCV 92.4 96.2  --   --  96.9  --  94.1 92.4  PLT 152 186  --   --  242  --  340 410*   < > = values in this interval not displayed.    Basic Metabolic Panel: Recent Labs  Lab 03/30/18 0434 03/30/18 0841 03/30/18 1838 03/31/18 0450  03/31/18 1726 04/01/18 0417 04/01/18 1030 04/02/18 0403 04/02/18 1820 04/03/18 0405  NA 145  --   --  144   < >  --  141 142 139 139 141  K 3.2*  --   --  4.5   < >  --  4.2 3.8 3.4* 3.6 3.9  CL 107  --   --  109  --   --  105  --  99 96* 98  CO2 26  --   --  27  --   --  28  --  31 34* 32  GLUCOSE 84  --   --  101*  --   --  125*  --  94 81 88  BUN 7  --   --  14  --   --  14  --  12 12 16   CREATININE 0.96  --   --  0.71  --   --  0.63  --  0.64 0.58 0.67  CALCIUM 7.1*  --   --  7.4*  --   --  7.6*  --  7.5* 8.1* 8.2*  MG 1.5* 2.5* 3.0* 2.5*  --  1.8  --   --  1.7  --  2.0  PHOS 4.4 5.2* 4.6 4.0  --  3.8  --   --   --   --   --    < > = values in this interval not displayed.   GFR: Estimated Creatinine Clearance: 89.8 mL/min (by C-G formula based on SCr of 0.67 mg/dL). Recent Labs  Lab 03/31/18 0450 04/01/18 0417 04/02/18 0403 04/03/18 0405  WBC 14.5* 13.0* 12.2* 10.4    Liver Function Tests: Recent Labs  Lab 04/03/18 0405  AST 34  ALT 46*  ALKPHOS 150*   BILITOT 0.5  PROT 5.2*  ALBUMIN 1.8*   No results for input(s): LIPASE, AMYLASE in the last 168 hours. No results for input(s): AMMONIA in the last 168 hours.  ABG    Component Value Date/Time   PHART 7.422 04/02/2018 0450   PCO2ART 54.9 (H) 04/02/2018 0450   PO2ART 123 (H) 04/02/2018 0450   HCO3 34.8 (H) 04/02/2018 0450  TCO2 36 (H) 04/01/2018 1030   ACIDBASEDEF 1.0 03/28/2018 0420   O2SAT 98.3 04/02/2018 0450     Coagulation Profile: No results for input(s): INR, PROTIME in the last 168 hours.  Cardiac Enzymes: No results for input(s): CKTOTAL, CKMB, CKMBINDEX, TROPONINI in the last 168 hours.  HbA1C: Hgb A1c MFr Bld  Date/Time Value Ref Range Status  02/10/2018 06:55 AM 5.0 4.8 - 5.6 % Final    Comment:    (NOTE) Pre diabetes:          5.7%-6.4% Diabetes:              >6.4% Glycemic control for   <7.0% adults with diabetes   09/17/2015 06:42 PM 5.2 4.8 - 5.6 % Final    Comment:    (NOTE)         Pre-diabetes: 5.7 - 6.4         Diabetes: >6.4         Glycemic control for adults with diabetes: <7.0     CBG: Recent Labs  Lab 04/03/18 1154 04/03/18 1529 04/03/18 1923 04/03/18 2256 04/04/18 0341  GLUCAP 98 95 74 79 82     Aayansh Codispoti A, DO 04/04/2018, 6:16 AM Pager: [phone]

## 2018-04-04 NOTE — Progress Notes (Signed)
Nutrition Follow-up  DOCUMENTATION CODES:   Not applicable  INTERVENTION:    Diet advancement as able when swallow evaluation completed.   If unable to safely advance diet when off BiPAP, recommend Cortrak placement (Cortrak service available M-W-F-Sat). Jevity 1.2 at 65 ml/h would meet protein and calorie needs.   NUTRITION DIAGNOSIS:   Inadequate oral intake related to inability to eat as evidenced by NPO status.  Ongoing  GOAL:   Patient will meet greater than or equal to 90% of their needs  Unmet  MONITOR:   Diet advancement, PO intake, Skin, I & O's  ASSESSMENT:   18 year old female with past medical hx of ADHD, anxiety, bipolar depression, previous suicide attempts, polysubstance abuse (cocaine, marijuana, heroin and alcohol abuse). She presented to the ED via EMS after mom found patient unresponsive. Bradycardic and hypotensive. Intubated. Given glucagon. Started on epi gtt. PCCM consulted for intentional OD (propanolol, xanax and Seroquel).  Extubated 1/29. TF off since extubation.  Currently requiring BiPAP. Patient having panic attacks, requiring haldol. Remains NPO. SLP to follow-up for swallow evaluation when she can remain off BiPAP. Labs and medications reviewed.   Diet Order:   Diet Order            Diet NPO time specified  Diet effective now              EDUCATION NEEDS:   No education needs have been identified at this time  Skin:  Skin Assessment: Skin Integrity Issues: Skin Integrity Issues:: Stage II Stage II: Right upper face  Last BM:  1/29  Height:   Ht Readings from Last 1 Encounters:  03/21/18 5\' 5"  (1.651 m) (61 %, Z= 0.27)*   * Growth percentiles are based on CDC (Girls, 2-20 Years) data.    Weight:   Wt Readings from Last 1 Encounters:  04/04/18 50.3 kg (16 %, Z= -0.98)*   * Growth percentiles are based on CDC (Girls, 2-20 Years) data.    Ideal Body Weight:  56.82 kg  BMI:  Body mass index is 18.45  kg/m.  Estimated Nutritional Needs:   Kcal:  1800-2000  Protein:  75-85 gm  Fluid:  >/= 1.8 L    Joaquin Courts, RD, LDN, CNSC Pager [phone] After Hours Pager [phone]

## 2018-04-04 NOTE — Progress Notes (Signed)
RN called to report to RT that PT had taken bipap mask off again and was om [place] at this time

## 2018-04-04 NOTE — Progress Notes (Addendum)
Patient requiring bipap the majority of today with frequent stridorous and labored breathing up 50 breaths/min. This evening breathing became more labored and ABG showed O2 of 68 on bipap 50%. Discussed plan with mom for controlled intubation at this time as airways sound swollen secondary to prolonged intubation and self extubation 1/24 with plan for trach in the am. Mom understands and agrees with plan.   Versie Starks, DO 04/04/2018, 7:32 PM Pager: [phone]

## 2018-04-04 NOTE — Progress Notes (Signed)
Pt with increased WOB and upper airway stridor. Pt placed on bipap and WOB immediately decreased and pt relaxed and fell asleep. RT will closely monitor pt

## 2018-04-04 NOTE — Progress Notes (Signed)
SLP Cancellation Note  Patient Details Name: Lindsay Lara MRN: [id number] DOB: 05-14-2000   Cancelled treatment:        Pt currently on Bipap. Will follow for improved respiratory status.    Royce Macadamia 04/04/2018, 9:01 AM  Breck Coons Lonell Face.Ed Nurse, children's [phone] Office [phone]

## 2018-04-04 NOTE — Progress Notes (Addendum)
RN called to room by sitter d/t trouble breathing. Pt c/o anxiety and panic attack, attempting to remove BiPAP mask. RN assessed stridor to bilateral lung sounds. Mask replaced on pt, RT called to bedside and e link notified, scheduled ativan dose given and symptoms improved, bilateral lung sounds clear. Will continue to monitor closely.

## 2018-04-05 ENCOUNTER — Inpatient Hospital Stay (HOSPITAL_COMMUNITY): Payer: PRIVATE HEALTH INSURANCE

## 2018-04-05 LAB — POCT I-STAT 7, (LYTES, BLD GAS, ICA,H+H)
Acid-Base Excess: 7 mmol/L — ABNORMAL HIGH (ref 0.0–2.0)
Acid-Base Excess: 5 mmol/L — ABNORMAL HIGH (ref 0.0–2.0)
Bicarbonate: 28.9 mmol/L — ABNORMAL HIGH (ref 20.0–28.0)
Bicarbonate: 26.7 mmol/L (ref 20.0–28.0)
Calcium, Ion: 1.16 mmol/L (ref 1.15–1.40)
Calcium, Ion: 1.15 mmol/L (ref 1.15–1.40)
HCT: 27 % — ABNORMAL LOW (ref 36.0–46.0)
HCT: 28 % — ABNORMAL LOW (ref 36.0–46.0)
Hemoglobin: 9.2 g/dL — ABNORMAL LOW (ref 12.0–15.0)
Hemoglobin: 9.5 g/dL — ABNORMAL LOW (ref 12.0–15.0)
O2 SAT: 99 %
O2 Saturation: 100 %
PCO2 ART: 30.3 mmHg — AB (ref 32.0–48.0)
PH ART: 7.587 — AB (ref 7.350–7.450)
Potassium: 2.9 mmol/L — ABNORMAL LOW (ref 3.5–5.1)
Potassium: 3.2 mmol/L — ABNORMAL LOW (ref 3.5–5.1)
Sodium: 137 mmol/L (ref 135–145)
Sodium: 137 mmol/L (ref 135–145)
TCO2: 30 mmol/L (ref 22–32)
TCO2: 28 mmol/L (ref 22–32)
pCO2 arterial: 28.2 mmHg — ABNORMAL LOW (ref 32.0–48.0)
pH, Arterial: 7.584 — ABNORMAL HIGH (ref 7.350–7.450)
pO2, Arterial: 176 mmHg — ABNORMAL HIGH (ref 83.0–108.0)
pO2, Arterial: 130 mmHg — ABNORMAL HIGH (ref 83.0–108.0)

## 2018-04-05 LAB — BASIC METABOLIC PANEL
Anion gap: 11 (ref 5–15)
BUN: 23 mg/dL — ABNORMAL HIGH (ref 6–20)
CALCIUM: 8.6 mg/dL — AB (ref 8.9–10.3)
CO2: 29 mmol/L (ref 22–32)
Chloride: 98 mmol/L (ref 98–111)
Creatinine, Ser: 0.83 mg/dL (ref 0.44–1.00)
GFR calc Af Amer: >60 mL/min (ref >60)
GFR calc non Af Amer: >60 mL/min (ref >60)
Glucose, Bld: 80 mg/dL (ref 70–99)
Potassium: 3 mmol/L — ABNORMAL LOW (ref 3.5–5.1)
Sodium: 138 mmol/L (ref 135–145)

## 2018-04-05 LAB — GLUCOSE, CAPILLARY
GLUCOSE-CAPILLARY: 102 mg/dL — AB (ref 70–99)
Glucose-Capillary: 80 mg/dL (ref 70–99)
Glucose-Capillary: 97 mg/dL (ref 70–99)

## 2018-04-05 LAB — CBC
HCT: 29.8 % — ABNORMAL LOW (ref 36.0–46.0)
Hemoglobin: 9.4 g/dL — ABNORMAL LOW (ref 12.0–15.0)
MCH: 28.8 pg (ref 26.0–34.0)
MCHC: 31.5 g/dL (ref 30.0–36.0)
MCV: 91.4 fL (ref 80.0–100.0)
PLATELETS: 616 10*3/uL — AB (ref 150–400)
RBC: 3.26 MIL/uL — ABNORMAL LOW (ref 3.87–5.11)
RDW: 13.3 % (ref 11.5–15.5)
WBC: 13.8 10*3/uL — ABNORMAL HIGH (ref 4.0–10.5)
nRBC: 0 % (ref 0.0–0.2)

## 2018-04-05 LAB — PROTIME-INR
INR: 1.13
Prothrombin Time: 14.4 seconds (ref 11.4–15.2)

## 2018-04-05 MED ORDER — HYDROMORPHONE BOLUS VIA INFUSION
6.0000 mg | Freq: Once | INTRAVENOUS | Status: AC
  Administered 2018-04-05: 6 mg via INTRAVENOUS

## 2018-04-05 MED ORDER — POTASSIUM CHLORIDE 20 MEQ/15ML (10%) PO SOLN
40.0000 meq | Freq: Once | ORAL | Status: AC
  Administered 2018-04-05: 40 meq via ORAL
  Filled 2018-04-05: qty 30

## 2018-04-05 MED ORDER — ORAL CARE MOUTH RINSE
15.0000 mL | OROMUCOSAL | Status: DC
  Administered 2018-04-05 – 2018-04-07 (×23): 15 mL via OROMUCOSAL

## 2018-04-05 MED ORDER — HYDROMORPHONE BOLUS VIA INFUSION
2.0000 mg | Freq: Once | INTRAVENOUS | Status: AC
  Administered 2018-04-05: 2 mg via INTRAVENOUS

## 2018-04-05 MED ORDER — PANTOPRAZOLE SODIUM 40 MG IV SOLR
40.0000 mg | INTRAVENOUS | Status: DC
  Administered 2018-04-05 – 2018-04-08 (×4): 40 mg via INTRAVENOUS
  Filled 2018-04-05 (×5): qty 40

## 2018-04-05 MED ORDER — VECURONIUM BROMIDE 10 MG IV SOLR
INTRAVENOUS | Status: AC
  Filled 2018-04-05: qty 10

## 2018-04-05 MED ORDER — ETOMIDATE 2 MG/ML IV SOLN
INTRAVENOUS | Status: AC
  Filled 2018-04-05: qty 20

## 2018-04-05 MED ORDER — POTASSIUM CHLORIDE 10 MEQ/50ML IV SOLN
10.0000 meq | INTRAVENOUS | Status: AC
  Administered 2018-04-05 (×6): 10 meq via INTRAVENOUS
  Filled 2018-04-05 (×6): qty 50

## 2018-04-05 MED ORDER — ENOXAPARIN SODIUM 40 MG/0.4ML ~~LOC~~ SOLN
40.0000 mg | SUBCUTANEOUS | Status: DC
  Administered 2018-04-06 – 2018-04-14 (×8): 40 mg via SUBCUTANEOUS
  Filled 2018-04-05 (×12): qty 0.4

## 2018-04-05 MED ORDER — CHLORHEXIDINE GLUCONATE 0.12% ORAL RINSE (MEDLINE KIT)
15.0000 mL | Freq: Two times a day (BID) | OROMUCOSAL | Status: DC
  Administered 2018-04-05 – 2018-04-07 (×4): 15 mL via OROMUCOSAL

## 2018-04-05 MED ORDER — STERILE WATER FOR INJECTION IJ SOLN
INTRAMUSCULAR | Status: AC
  Filled 2018-04-05: qty 10

## 2018-04-05 MED ORDER — PROPOFOL 500 MG/50ML IV EMUL
INTRAVENOUS | Status: AC
  Filled 2018-04-05: qty 50

## 2018-04-05 MED ORDER — VITAL AF 1.2 CAL PO LIQD
1000.0000 mL | ORAL | Status: DC
  Administered 2018-04-05 – 2018-04-10 (×5): 1000 mL

## 2018-04-05 NOTE — Progress Notes (Signed)
PT Cancellation Note  Patient Details Name: Lindsay Lara MRN: [id number] DOB: [birth year]   Cancelled Treatment:       Reason Eval/Treat Not Completed: Patient at procedure or test/ unavailable;Medical issues which prohibited therapy.  Pt reintubated and going for trach today.   Etta GrandchildSean Krissi Willaims, PT, DPT Acute Rehabilitation Services Pager: [phone] Office: [phone]     Etta GrandchildSean Marayah Higdon 04/05/2018, 11:21 AM

## 2018-04-05 NOTE — Procedures (Signed)
Bronchoscopy  for Percutaneous  Tracheostomy  Name: Lindsay Lara MRN: [id number] DOB: 2000-06-12 Procedure: Bronchoscopy for Percutaneous Tracheostomy Indications: Diagnostic evaluation of the airways In conjunction with: Dr.   Procedure Details Consent: Risks of procedure as well as the alternatives and risks of each were explained to the (patient/caregiver).  Consent for procedure obtained. Time Out: Verified patient identification, verified procedure, site/side was marked, verified correct patient position, special equipment/implants available, medications/allergies/relevent history reviewed, required imaging and test results available.  Performed  In preparation for procedure, patient was given 100% FiO2 and bronchoscope lubricated. Sedation: Benzodiazepines, Muscle relaxants and Etomidate  Airway entered and the following bronchi were examined: Trachea Procedures performed: Endotracheal Tube retracted in 2 cm increments. Cannulation of airway observed. Dilation observed. Placement of trachel tube  observed . No overt complications. Bronchoscope removed.    Evaluation Hemodynamic Status: BP stable throughout; O2 sats: stable throughout Patient's Current Condition: stable Specimens:  None Complications: No apparent complications Patient did tolerate procedure well.   Brett Canales Dejae Bernet ACNP [facility] PCCM Pager [phone] till 3 pm If no answer page [phone] 04/05/2018, 11:11 AM

## 2018-04-05 NOTE — Progress Notes (Signed)
NAME:  Lindsay Lara, MRN:  [id number], DOB:  28-May-2000, LOS: 15 ADMISSION DATE:  03/21/2018, CONSULTATION DATE:  03/21/2018 REFERRING MD:  Dr. [name] Carl CHIEF COMPLAINT:  Altered Mental Status    Brief History   18 yr old female w/ PMHx ADHD, Anxiety, Bipolar depression,previous suicide attempts,Polysubstance abuse (cocaine, Marijuana,heroin and ETOH) presentsvia EMS after Mom found pt unresponsive. Bradycardic to 40sandhypotensive with SBP in the 40s. Intubated.Given Glucagon.Started on epi gtt. PCCM consulted for Intentional OD (propanolol, xanax and Seroquel)  Significant Hospital Events   1/16>Admission  1/16>Seen by Neurology - MRI for possible lateralizing signs  Consults:  PCCM Neurology   Procedures:  1/16> Intubated 1/16>CVL placed  1/17> chest tube placed 1/18>Bedside bronchoscopy , Therapeutic suctioning  1/19>extubated>reintubated 1/24>extubated 1/24>reintubated  1/29> extubated    Significant Diagnostic Tests:  CT head: Unremarkable  EKG: RBBB, Prolonged QTc 528 EEG: possible right focal cerebral dysfunction. No clear seizure activity. MRI: Negative for acute infarct or mass, Question diffuse cortical edema right hemisphere on axial T2 images.While this could be due to artifact, consider seizure related cortical edema. EEG 1/23: no seizure activity   Micro Data:  1/18 Urine Cx> 1/18 Urinalysis> 1/18 Blood Cx> 1/18 Tracheal Aspirate Cx> Rare GNR, pending sensitivity/cx  1/21 Repeat Resp culture > rare candida  1/24 Sputum > normal resp flora  1/25 Blood > NGTD  Antimicrobials:  1/18 Zosyn>1/19 1/18 Vancomycin>1/19 1/19 Unasyn> 1/21 1/21 Ampicillin > 1/23 1/23 Unasyn > 1/26 1/26 Vanc/Cefepime   Interim history/subjective:  Reintubated yesterday evening due to ongoing requirement of bipap, tachypnea, and hypoxemia.   Objective   Blood pressure 122/71, pulse 79, temperature 98.9 F (37.2 C), temperature source Oral, resp. rate (!) 23, height  5\' 5"  (1.651 m), weight 46.7 kg, last menstrual period 03/21/2018, SpO2 99 %.    Vent Mode: PRVC FiO2 (%):  [50 %-100 %] 100 % Set Rate:  [8 bmp-25 bmp] 25 bmp Vt Set:  [450 mL] 450 mL PEEP:  [5 cmH20-7 cmH20] 5 cmH20 Plateau Pressure:  [18 cmH20-22 cmH20] 19 cmH20   Intake/Output Summary (Last 24 hours) at 04/05/2018 4098 Last data filed at 04/05/2018 0600 Gross per 24 hour  Intake 1486.08 ml  Output 3000 ml  Net -1513.92 ml   Filed Weights   04/03/18 0500 04/04/18 0500 04/05/18 0500  Weight: 53.8 kg 50.3 kg 46.7 kg    Examination:  General: acute distress, hyperventilating, supine in bed Eyes: pupils reactive, eom intact  ENT: [place] in place, [place]/AT Cardiac: tachycardic, regular rhythm, no m/r/g  Pulmonary: CTA, no w/r/r  Abdomen: soft, non tender, hypoactive BS Extremities: no cyanosis, clubbing, or edema Skin: c/d/i  Neuro: alert & oriented, following commands, hyperventilating and anxious   Resolved Hospital Problem list   Left apical Pneumothorax   Acute  respiratory failure secondary to respiratory depression from intentional overdose  Assessment & Plan:  18 yr old female w/ PMHx ADHD, Anxiety, Bipolar depression,previous suicide attempts,Polysubstance abuse (cocaine, Marijuana,heroin and ETOH) presentsfollowing intentional drug overdose with Propranolol and Seroquel. Utox positive for benzos and THC.   Hypoxemic Respiratory Failure requiring reintubation ARDs 2/2 HAP/Aspiration Pneumonia   Required reintubation yesterday evening due to ongoing increased work of breathing and hypoxemia on bipap. Plan for trach this morning. She is almost euvolemic with ongoing diuresis.  - trach this am - will not wean  - cont. Sedation - completed antibiotics yesterday  - prn BDs  Acute Encephalopathy at Admission Opioid, benzo, alcohol use disorder ADHD, bipolar disorder depression, anxiety disorder,  previous suicidal attempt Anxiety Difficult to sedate  - psych consult  today  - haldol q6h prn  - continue atarax 25 q8h, seroquel 300 qhs after SLP eval  - cont. Clonidine .3mg  q6h  High risk behavior Plan - continue acyclovir prophylaxis  - Known history of genital herpes  Constipation - glycerin suppository prn  - sennokot scheduled when able to take po   Best practice:  Diet:NPO, slp eval today  DVT prophylaxis:Lovenox GI prophylaxis:none Mobility: bed rest Code Status:FULL Family Communication: mom at bedside   Labs   CBC: Recent Labs  Lab 03/30/18 0434 03/31/18 0450  04/01/18 0417  04/02/18 0403 04/03/18 0405 04/04/18 0500 04/04/18 1837 04/04/18 2012  WBC 17.8* 14.5*  --  13.0*  --  12.2* 10.4 12.3*  --   --   NEUTROABS 14.3*  --   --   --   --   --   --   --   --   --   HGB 9.5* 8.3*   < > 8.2*   < > 9.0* 8.6* 10.1* 10.2* 10.5*  HCT 30.6* 27.5*   < > 27.8*   < > 28.8* 27.9* 31.4* 30.0* 31.0*  MCV 92.4 96.2  --  96.9  --  94.1 92.4 92.1  --   --   PLT 152 186  --  242  --  340 410* 560*  --   --    < > = values in this interval not displayed.    Basic Metabolic Panel: Recent Labs  Lab 03/30/18 0434 03/30/18 0841 03/30/18 1838 03/31/18 0450  03/31/18 1726 04/01/18 0417  04/02/18 0403 04/02/18 1820 04/03/18 0405 04/04/18 0500 04/04/18 1837 04/04/18 2012  NA 145  --   --  144   < >  --  141   < > 139 139 141 139 135 135  K 3.2*  --   --  4.5   < >  --  4.2   < > 3.4* 3.6 3.9 4.0 3.9 3.9  CL 107  --   --  109  --   --  105  --  99 96* 98 99  --   --   CO2 26  --   --  27  --   --  28  --  31 34* 32 29  --   --   GLUCOSE 84  --   --  101*  --   --  125*  --  94 81 88 83  --   --   BUN 7  --   --  14  --   --  14  --  12 12 16 14   --   --   CREATININE 0.96  --   --  0.71  --   --  0.63  --  0.64 0.58 0.67 0.68  --   --   CALCIUM 7.1*  --   --  7.4*  --   --  7.6*  --  7.5* 8.1* 8.2* 8.5*  --   --   MG 1.5* 2.5* 3.0* 2.5*  --  1.8  --   --  1.7  --  2.0 2.2  --   --   PHOS 4.4 5.2* 4.6 4.0  --  3.8  --   --   --    --   --   --   --   --    < > = values in this interval  not displayed.   GFR: Estimated Creatinine Clearance: 83.4 mL/min (by C-G formula based on SCr of 0.68 mg/dL). Recent Labs  Lab 04/01/18 0417 04/02/18 0403 04/03/18 0405 04/04/18 0500  WBC 13.0* 12.2* 10.4 12.3*    Liver Function Tests: Recent Labs  Lab 04/03/18 0405  AST 34  ALT 46*  ALKPHOS 150*  BILITOT 0.5  PROT 5.2*  ALBUMIN 1.8*   No results for input(s): LIPASE, AMYLASE in the last 168 hours. No results for input(s): AMMONIA in the last 168 hours.  ABG    Component Value Date/Time   PHART 7.522 (H) 04/04/2018 2012   PCO2ART 37.9 04/04/2018 2012   PO2ART 81.0 (L) 04/04/2018 2012   HCO3 31.1 (H) 04/04/2018 2012   TCO2 32 04/04/2018 2012   ACIDBASEDEF 1.0 03/28/2018 0420   O2SAT 97.0 04/04/2018 2012     Coagulation Profile: No results for input(s): INR, PROTIME in the last 168 hours.  Cardiac Enzymes: No results for input(s): CKTOTAL, CKMB, CKMBINDEX, TROPONINI in the last 168 hours.  HbA1C: Hgb A1c MFr Bld  Date/Time Value Ref Range Status  02/10/2018 06:55 AM 5.0 4.8 - 5.6 % Final    Comment:    (NOTE) Pre diabetes:          5.7%-6.4% Diabetes:              >6.4% Glycemic control for   <7.0% adults with diabetes   09/17/2015 06:42 PM 5.2 4.8 - 5.6 % Final    Comment:    (NOTE)         Pre-diabetes: 5.7 - 6.4         Diabetes: >6.4         Glycemic control for adults with diabetes: <7.0     CBG: Recent Labs  Lab 04/03/18 1923 04/03/18 2256 04/04/18 0341 04/04/18 2009 04/04/18 2343  GLUCAP 74 79 82 97 84     Jaslen Adcox A, DO 04/05/2018, 6:23 AM Pager: [phone]

## 2018-04-05 NOTE — Procedures (Signed)
Cortrak  Person Inserting Tube:  Lindsay Lara, RD Tube Type:  Cortrak - 43 inches Tube Location:  Left nare Initial Placement:  Postpyloric Secured by: Bridle Technique Used to Measure Tube Placement:  Documented cm marking at nare/ corner of mouth Cortrak Secured At:  92 cm    Cortrak Tube Team Note:  Consult received to place a Cortrak feeding tube.    X-ray is required, abdominal x-ray has been ordered by the Cortrak team. Please confirm tube placement before using the Cortrak tube.   If the tube becomes dislodged please keep the tube and contact the Cortrak team at [email or web address] ([email or web address]) for replacement.  If after hours and replacement cannot be delayed, place a NG tube and confirm placement with an abdominal x-ray.    Lindsay Reading, MS, RD, LDN Inpatient Clinical Dietitian Pager: [phone] Weekend/After Hours: [phone]

## 2018-04-05 NOTE — Progress Notes (Signed)
SLP Cancellation Note  Patient Details Name: Lindsay Lara MRN: [id number] DOB: 10/18/2000   Cancelled treatment:       Reason Eval/Treat Not Completed: Patient not medically ready;Medical issues which prohibited therapy Pt has been intubated and RN indicated that a trach is scheduled for 1000.   Scheryl Marten 04/05/2018, 8:24 AM

## 2018-04-05 NOTE — Procedures (Addendum)
Percutaneous Tracheostomy Placement  Consent from family.  Patient sedated, paralyzed and position.  Placed on 100% FiO2 and RR matched.  Area cleaned and draped.  Lidocaine/epi injected.  Skin incision done followed by blunt dissection.  Trachea palpated then punctured, catheter passed and visualized bronchoscopically.  Wire placed and visualized.  Catheter removed.  Airway then entered and dilated.  Size 6 cuffed shiley trach placed and visualized bronchoscopically well above carina.  Good volume returns.  Patient tolerated the procedure well without complications.  Minimal blood loss.  CXR ordered and pending.  Assisted by Dr. Thana Farr, M.D. [facility] Pulmonary/Critical Care Medicine. Pager: [phone]. After hours pager: [phone].

## 2018-04-05 NOTE — Progress Notes (Signed)
OT Cancellation Note  Patient Details Name: Lindsay Lara MRN: [id number] DOB: [birth year]   Cancelled Treatment:    Reason Eval/Treat Not Completed: Patient at procedure or test/ unavailable;Medical issues which prohibited therapy.  Pt reintubated and going for trach today.  Jeani Hawking, OTR/L Acute Rehabilitation Services Pager [phone] Office [phone]   Jeani Hawking M 04/05/2018, 11:13 AM

## 2018-04-05 NOTE — Progress Notes (Signed)
Nutrition Follow-up  DOCUMENTATION CODES:   Not applicable  INTERVENTION:   Resume TF via Cortrak tube:  Vital AF 1.2 at 55 ml/h (1320 ml per day)  Provides 1584 kcal, 99 gm protein, 1071 ml free water daily  NUTRITION DIAGNOSIS:   Inadequate oral intake related to inability to eat as evidenced by NPO status.  Ongoing  GOAL:   Patient will meet greater than or equal to 90% of their needs  Progressing  MONITOR:   Vent status, TF tolerance, Labs, I & O's  ASSESSMENT:   18 year old female with past medical hx of ADHD, anxiety, bipolar depression, previous suicide attempts, polysubstance abuse (cocaine, marijuana, heroin and alcohol abuse). She presented to the ED via EMS after mom found patient unresponsive. Bradycardic and hypotensive. Intubated. Given glucagon. Started on epi gtt. PCCM consulted for intentional OD (propanolol, xanax and Seroquel).  Patient required re-intubation yesterday. S/P tracheostomy this morning. Cortrak also placed this morning. Tip is in the 2nd or 3rd portion of the duodenum.   Patient is currently intubated on ventilator support MV: 8.3 L/min Temp (24hrs), Avg:99.8 F (37.7 C), Min:98.9 F (37.2 C), Max:100.6 F (38.1 C)  Propofol:  none   Labs reviewed. Potassium 3.2 (L) Medications reviewed and include folic acid, thiamine, KCl.   Diet Order:   Diet Order            Diet NPO time specified  Diet effective midnight              EDUCATION NEEDS:   No education needs have been identified at this time  Skin:  Skin Assessment: Skin Integrity Issues: Skin Integrity Issues:: Stage II Stage II: Right upper face  Last BM:  1/29  Height:   Ht Readings from Last 1 Encounters:  03/21/18 5\' 5"  (1.651 m) (61 %, Z= 0.27)*   * Growth percentiles are based on CDC (Girls, 2-20 Years) data.    Weight:   Wt Readings from Last 1 Encounters:  04/05/18 46.7 kg (6 %, Z= -1.57)*   * Growth percentiles are based on CDC (Girls,  2-20 Years) data.    Ideal Body Weight:  56.82 kg  BMI:  Body mass index is 17.13 kg/m.  Estimated Nutritional Needs:   Kcal:  1600  Protein:  85-100 gm  Fluid:  >/= 1.6 L    Joaquin Courts, RD, LDN, CNSC Pager [phone] After Hours Pager [phone]

## 2018-04-05 NOTE — Care Management Note (Signed)
Case Management Note  Patient Details  Name: Lindsay Lara MRN: [id number] Date of Birth: 08-03-2000  Subjective/Objective:  Admitted post drug overdose - pt has hx of suicide attempts                  Action/Plan:   PTA from home with father.  Pt remains on ventilator and will require pysch eval    Expected Discharge Date:                  [facility]  In-House Referral:  Clinical Social Work  Discharge planning Services  CM Consult  Post Acute Care Choice:    Choice offered to:     DME Arranged:    DME Agency:     HH Arranged:    HH Agency:     Status of Service:  In process, will continue to follow  If discussed at Long Length of Stay Meetings, dates discussed:    Additional Comments: 04/05/2018 Pt now s/p trach.  Pt will require pysch facility at discharge due to suicide attempt Cherylann Parr, RN 04/05/2018, 3:56 PM

## 2018-04-06 DIAGNOSIS — Z430 Encounter for attention to tracheostomy: Secondary | ICD-10-CM

## 2018-04-06 LAB — COMPREHENSIVE METABOLIC PANEL
ALK PHOS: 104 U/L (ref 38–126)
ALT: 46 U/L — ABNORMAL HIGH (ref 0–44)
ANION GAP: 9 (ref 5–15)
AST: 48 U/L — ABNORMAL HIGH (ref 15–41)
Albumin: 2.4 g/dL — ABNORMAL LOW (ref 3.5–5.0)
BILIRUBIN TOTAL: 0.2 mg/dL — AB (ref 0.3–1.2)
BUN: 22 mg/dL — ABNORMAL HIGH (ref 6–20)
CO2: 24 mmol/L (ref 22–32)
Calcium: 8.2 mg/dL — ABNORMAL LOW (ref 8.9–10.3)
Chloride: 105 mmol/L (ref 98–111)
Creatinine, Ser: 0.59 mg/dL (ref 0.44–1.00)
GFR calc Af Amer: >60 mL/min (ref >60)
GFR calc non Af Amer: >60 mL/min (ref >60)
Glucose, Bld: 111 mg/dL — ABNORMAL HIGH (ref 70–99)
Potassium: 3.9 mmol/L (ref 3.5–5.1)
SODIUM: 138 mmol/L (ref 135–145)
TOTAL PROTEIN: 5.6 g/dL — AB (ref 6.5–8.1)

## 2018-04-06 LAB — CBC
HCT: 27.5 % — ABNORMAL LOW (ref 36.0–46.0)
Hemoglobin: 8.7 g/dL — ABNORMAL LOW (ref 12.0–15.0)
MCH: 29.3 pg (ref 26.0–34.0)
MCHC: 31.6 g/dL (ref 30.0–36.0)
MCV: 92.6 fL (ref 80.0–100.0)
Platelets: 618 10*3/uL — ABNORMAL HIGH (ref 150–400)
RBC: 2.97 MIL/uL — ABNORMAL LOW (ref 3.87–5.11)
RDW: 13.4 % (ref 11.5–15.5)
WBC: 11 10*3/uL — ABNORMAL HIGH (ref 4.0–10.5)
nRBC: 0 % (ref 0.0–0.2)

## 2018-04-06 LAB — GLUCOSE, CAPILLARY
GLUCOSE-CAPILLARY: 95 mg/dL (ref 70–99)
Glucose-Capillary: 83 mg/dL (ref 70–99)
Glucose-Capillary: 96 mg/dL (ref 70–99)
Glucose-Capillary: 104 mg/dL — ABNORMAL HIGH (ref 70–99)

## 2018-04-06 LAB — MAGNESIUM: Magnesium: 2 mg/dL (ref 1.7–2.4)

## 2018-04-06 MED ORDER — OXYCODONE HCL 5 MG PO TABS
10.0000 mg | ORAL_TABLET | Freq: Four times a day (QID) | ORAL | Status: DC
  Administered 2018-04-06 – 2018-04-09 (×10): 10 mg via ORAL
  Filled 2018-04-06 (×10): qty 2

## 2018-04-06 MED ORDER — SERTRALINE HCL 50 MG PO TABS
50.0000 mg | ORAL_TABLET | Freq: Every day | ORAL | Status: DC
  Administered 2018-04-06 – 2018-04-11 (×6): 50 mg
  Filled 2018-04-06 (×6): qty 1

## 2018-04-06 NOTE — Progress Notes (Signed)
Dilaudid drip stopped and precedex increased to 1.84mcg. VSS. Patient resting more comfortably in bed. Will continue to monitor.

## 2018-04-06 NOTE — Progress Notes (Signed)
RT placed patient on 35% ATC. Patient is tolerating well at this time. Family at bedside. RT will continue to monitor.

## 2018-04-06 NOTE — Progress Notes (Signed)
NAME:  Rico AlaMadison E Mccollum, MRN:  [id number], DOB:  August 16, 2000, LOS: 16 ADMISSION DATE:  03/21/2018, CONSULTATION DATE:  03/21/2018 REFERRING MD:  Dr. [name] CarlScheider CHIEF COMPLAINT:  Altered Mental Status    Brief History   18 yr old female w/ PMHx ADHD, Anxiety, Bipolar depression,previous suicide attempts,Polysubstance abuse (cocaine, Marijuana,heroin and ETOH) presentsvia EMS after Mom found pt unresponsive. Bradycardic to 40sandhypotensive with SBP in the 40s. Intubated.Given Glucagon.Started on epi gtt. PCCM consulted for Intentional OD (propanolol, xanax and Seroquel)  Significant Hospital Events   1/16>Admission  1/16>Seen by Neurology - MRI for possible lateralizing signs  Consults:  PCCM Neurology   Procedures:  1/16> Intubated 1/16>CVL placed  1/17> chest tube placed 1/18>Bedside bronchoscopy , Therapeutic suctioning  1/19>extubated>reintubated 1/24>extubated 1/24>reintubated  1/29> extubated  1/31> Trach  Significant Diagnostic Tests:  CT head: Unremarkable  EKG: RBBB, Prolonged QTc 528 EEG: possible right focal cerebral dysfunction. No clear seizure activity. MRI: Negative for acute infarct or mass, Question diffuse cortical edema right hemisphere on axial T2 images.While this could be due to artifact, consider seizure related cortical edema. EEG 1/23: no seizure activity   Micro Data:  1/18 Urine Cx> 1/18 Urinalysis> 1/18 Blood Cx> 1/18 Tracheal Aspirate Cx> Rare GNR, pending sensitivity/cx  1/21 Repeat Resp culture > rare candida  1/24 Sputum > normal resp flora  1/25 Blood > NGTD  Antimicrobials:  1/18 Zosyn>1/19 1/18 Vancomycin>1/19 1/19 Unasyn> 1/21 1/21 Ampicillin > 1/23 1/23 Unasyn > 1/26 1/26 Vanc/Cefepime   Interim history/subjective:  Tracheostomy yesterday  Objective   Blood pressure (!) 112/59, pulse (!) 119, temperature 98.9 F (37.2 C), temperature source Axillary, resp. rate (!) 29, height 5\' 5"  (1.651 m), weight 47.4 kg, last menstrual  period 03/21/2018, SpO2 100 %.    Vent Mode: CPAP;PSV FiO2 (%):  [40 %] 40 % Set Rate:  [18 bmp] 18 bmp Vt Set:  [450 mL] 450 mL PEEP:  [5 cmH20] 5 cmH20 Pressure Support:  [10 cmH20] 10 cmH20 Plateau Pressure:  [23 cmH20-24 cmH20] 24 cmH20   Intake/Output Summary (Last 24 hours) at 04/06/2018 1142 Last data filed at 04/06/2018 1100 Gross per 24 hour  Intake 2368.93 ml  Output 1200 ml  Net 1168.93 ml   Filed Weights   04/04/18 0500 04/05/18 0500 04/06/18 0425  Weight: 50.3 kg 46.7 kg 47.4 kg    Examination:  General: acute distress, hyperventilating, supine in bed Eyes: pupils reactive, eom intact  ENT: [place] in place, [place]/AT Cardiac: tachycardic, regular rhythm, no m/r/g  Pulmonary: CTA, no w/r/r  Abdomen: soft, non tender, hypoactive BS Extremities: no cyanosis, clubbing, or edema Skin: c/d/i  Neuro: alert & oriented, following commands, hyperventilating and  anxious   Physical Exam: General: Chronically ill appearing young female, laying in bed, no acute distress HENT: [place], AT, OP clear, MMM, trach in place Eyes: EOMI, no scleral icterus Respiratory: Clear to auscultation bilaterally.  No crackles, wheezing or rales Cardiovascular: RRR, -M/R/G, no JVD GI: BS+, soft, nontender Extremities:-Edema,-tenderness Neuro: Awake and follows commands, moves extremities x 4 Skin: Intact, no rashes or bruising GU: Foley in place  Resolved Hospital Problem list   Left apical Pneumothorax   Acute  respiratory failure secondary to respiratory depression from intentional overdose  Assessment & Plan:  18 yr old female w/ PMHx ADHD, Anxiety, Bipolar depression,previous suicide attempts,Polysubstance abuse (cocaine, Marijuana,heroin and ETOH) presentsfollowing intentional drug overdose with Propranolol and Seroquel. Utox positive for benzos and THC.   ARDS/Aspiration pneumonia s/p tracheostomy 1/31 - S/p Cefepime x5d  for aspiration pneumonia - Trach collar trials if able - Vent  support as needed  Acute encephalopathy secondary to delirium and mental health diagnosis as noted below - On Precedex - Wean dilaudid off - Start oxycodone 10 mg q6h - Continue scheduled Ativan 1mg  q6h - Continue atarax 25 q8h, seroquel 300 BID and Clonidine 0.3mg  q6h   Suicide Attempt  Hx prior SI and attempts Hx polysubstance abuse ADHD Bipolar and anxiety disorder - Psych consult - Start sertraline  Hx genital herpes - Continue home prophylaxis : acyclovir. Transition to PO when able  Constipation - glycerin suppository prn  - sennokot scheduled when able to take po   Best practice:  Diet:Tube feedings DVT prophylaxis:Lovenox GI prophylaxis:none Mobility: bed rest Code Status:FULL Family Communication: Updated mom at bedside  Labs   CBC: Recent Labs  Lab 04/02/18 0403 04/03/18 0405 04/04/18 0500  04/04/18 2012 04/05/18 0513 04/05/18 0944 04/05/18 1220 04/06/18 0422  WBC 12.2* 10.4 12.3*  --   --  13.8*  --   --  11.0*  HGB 9.0* 8.6* 10.1*   < > 10.5* 9.4* 9.2* 9.5* 8.7*  HCT 28.8* 27.9* 31.4*   < > 31.0* 29.8* 27.0* 28.0* 27.5*  MCV 94.1 92.4 92.1  --   --  91.4  --   --  92.6  PLT 340 410* 560*  --   --  616*  --   --  618*   < > = values in this interval not displayed.    Basic Metabolic Panel: Recent Labs  Lab 03/30/18 1838 03/31/18 0450  03/31/18 1726  04/02/18 0403 04/02/18 1820 04/03/18 0405 04/04/18 0500  04/04/18 2012 04/05/18 0513 04/05/18 0944 04/05/18 1220 04/06/18 0422  NA  --  144   < >  --    < > 139 139 141 139   < > 135 138 137 137 138  K  --  4.5   < >  --    < > 3.4* 3.6 3.9 4.0   < > 3.9 3.0* 2.9* 3.2* 3.9  CL  --  109  --   --    < > 99 96* 98 99  --   --  98  --   --  105  CO2  --  27  --   --    < > 31 34* 32 29  --   --  29  --   --  24  GLUCOSE  --  101*  --   --    < > 94 81 88 83  --   --  80  --   --  111*  BUN  --  14  --   --    < > 12 12 16 14   --   --  23*  --   --  22*  CREATININE  --  0.71  --   --    <  > 0.64 0.58 0.67 0.68  --   --  0.83  --   --  0.59  CALCIUM  --  7.4*  --   --    < > 7.5* 8.1* 8.2* 8.5*  --   --  8.6*  --   --  8.2*  MG 3.0* 2.5*  --  1.8  --  1.7  --  2.0 2.2  --   --   --   --   --  2.0  PHOS 4.6 4.0  --  3.8  --   --   --   --   --   --   --   --   --   --   --    < > =  values in this interval not displayed.   GFR: Estimated Creatinine Clearance: 84.6 mL/min (by C-G formula based on SCr of 0.59 mg/dL). Recent Labs  Lab 04/03/18 0405 04/04/18 0500 04/05/18 0513 04/06/18 0422  WBC 10.4 12.3* 13.8* 11.0*    Liver Function Tests: Recent Labs  Lab 04/03/18 0405 04/06/18 0422  AST 34 48*  ALT 46* 46*  ALKPHOS 150* 104  BILITOT 0.5 0.2*  PROT 5.2* 5.6*  ALBUMIN 1.8* 2.4*   No results for input(s): LIPASE, AMYLASE in the last 168 hours. No results for input(s): AMMONIA in the last 168 hours.  ABG    Component Value Date/Time   PHART 7.584 (H) 04/05/2018 1220   PCO2ART 28.2 (L) 04/05/2018 1220   PO2ART 130.0 (H) 04/05/2018 1220   HCO3 26.7 04/05/2018 1220   TCO2 28 04/05/2018 1220   ACIDBASEDEF 1.0 03/28/2018 0420   O2SAT 99.0 04/05/2018 1220     Coagulation Profile: Recent Labs  Lab 04/05/18 0513  INR 1.13    Cardiac Enzymes: No results for input(s): CKTOTAL, CKMB, CKMBINDEX, TROPONINI in the last 168 hours.  HbA1C: Hgb A1c MFr Bld  Date/Time Value Ref Range Status  02/10/2018 06:55 AM 5.0 4.8 - 5.6 % Final    Comment:    (NOTE) Pre diabetes:          5.7%-6.4% Diabetes:              >6.4% Glycemic control for   <7.0% adults with diabetes   09/17/2015 06:42 PM 5.2 4.8 - 5.6 % Final    Comment:    (NOTE)         Pre-diabetes: 5.7 - 6.4         Diabetes: >6.4         Glycemic control for adults with diabetes: <7.0     CBG: Recent Labs  Lab 04/05/18 1117 04/05/18 1806 04/05/18 2316 04/06/18 0606 04/06/18 1103  GLUCAP 80 102* 97 83 95    Mechele Collin, M.D. [facility] Pulmonary/Critical Care Medicine Pager:  [phone] After hours pager: [phone]

## 2018-04-06 NOTE — Progress Notes (Signed)
SLP Cancellation Note  Patient Details Name: Lindsay Lara MRN: [id number] DOB: 01-Oct-2000   Cancelled treatment:       Reason Eval/Treat Not Completed: Patient not medically ready;Medical issues which prohibited therapy  Ferdinand Lango MA, CCC-SLP   [name] 04/06/2018, 7:57 AM

## 2018-04-06 NOTE — Progress Notes (Signed)
RT called to bedside d/t pt's RR in 40's and HR in 150's on trach collar. Patient placed back on vent 40%/5/18 450. Patient started back on dilaudid drip at 1mg /hr until pt is more comfortable and VSS. Will continue to monitor.

## 2018-04-07 ENCOUNTER — Inpatient Hospital Stay (HOSPITAL_COMMUNITY): Payer: PRIVATE HEALTH INSURANCE

## 2018-04-07 DIAGNOSIS — J80 Acute respiratory distress syndrome: Secondary | ICD-10-CM | POA: Diagnosis present

## 2018-04-07 DIAGNOSIS — F419 Anxiety disorder, unspecified: Secondary | ICD-10-CM

## 2018-04-07 DIAGNOSIS — Z930 Tracheostomy status: Secondary | ICD-10-CM

## 2018-04-07 LAB — CBC WITH DIFFERENTIAL/PLATELET
Abs Immature Granulocytes: 0.09 10*3/uL — ABNORMAL HIGH (ref 0.00–0.07)
Basophils Absolute: 0.1 10*3/uL (ref 0.0–0.1)
Basophils Relative: 1 %
EOS PCT: 10 %
Eosinophils Absolute: 0.9 10*3/uL — ABNORMAL HIGH (ref 0.0–0.5)
HCT: 29.2 % — ABNORMAL LOW (ref 36.0–46.0)
HEMOGLOBIN: 9.2 g/dL — AB (ref 12.0–15.0)
Immature Granulocytes: 1 %
Lymphocytes Relative: 33 %
Lymphs Abs: 3 10*3/uL (ref 0.7–4.0)
MCH: 28.8 pg (ref 26.0–34.0)
MCHC: 31.5 g/dL (ref 30.0–36.0)
MCV: 91.3 fL (ref 80.0–100.0)
Monocytes Absolute: 0.9 10*3/uL (ref 0.1–1.0)
Monocytes Relative: 9 %
NEUTROS ABS: 4.3 10*3/uL (ref 1.7–7.7)
NEUTROS PCT: 46 %
Platelets: 602 10*3/uL — ABNORMAL HIGH (ref 150–400)
RBC: 3.2 MIL/uL — ABNORMAL LOW (ref 3.87–5.11)
RDW: 13.2 % (ref 11.5–15.5)
WBC: 9.3 10*3/uL (ref 4.0–10.5)
nRBC: 0 % (ref 0.0–0.2)

## 2018-04-07 LAB — COMPREHENSIVE METABOLIC PANEL
ALT: 50 U/L — ABNORMAL HIGH (ref 0–44)
AST: 41 U/L (ref 15–41)
Albumin: 2.4 g/dL — ABNORMAL LOW (ref 3.5–5.0)
Alkaline Phosphatase: 105 U/L (ref 38–126)
Anion gap: 8 (ref 5–15)
BUN: 13 mg/dL (ref 6–20)
CHLORIDE: 102 mmol/L (ref 98–111)
CO2: 26 mmol/L (ref 22–32)
Calcium: 8.5 mg/dL — ABNORMAL LOW (ref 8.9–10.3)
Creatinine, Ser: 0.43 mg/dL — ABNORMAL LOW (ref 0.44–1.00)
GFR calc Af Amer: >60 mL/min (ref >60)
GFR calc non Af Amer: >60 mL/min (ref >60)
Glucose, Bld: 87 mg/dL (ref 70–99)
Potassium: 4.1 mmol/L (ref 3.5–5.1)
SODIUM: 136 mmol/L (ref 135–145)
Total Bilirubin: 0.5 mg/dL (ref 0.3–1.2)
Total Protein: 5.7 g/dL — ABNORMAL LOW (ref 6.5–8.1)

## 2018-04-07 LAB — GLUCOSE, CAPILLARY
Glucose-Capillary: 110 mg/dL — ABNORMAL HIGH (ref 70–99)
Glucose-Capillary: 91 mg/dL (ref 70–99)
Glucose-Capillary: 79 mg/dL (ref 70–99)

## 2018-04-07 MED ORDER — MIDAZOLAM HCL 2 MG/2ML IJ SOLN
0.5000 mg | Freq: Four times a day (QID) | INTRAMUSCULAR | Status: DC | PRN
  Administered 2018-04-08 – 2018-04-09 (×5): 0.5 mg via INTRAVENOUS
  Filled 2018-04-07 (×5): qty 2

## 2018-04-07 MED ORDER — DEXTROSE 50 % IV SOLN
INTRAVENOUS | Status: AC
  Filled 2018-04-07: qty 50

## 2018-04-07 MED ORDER — CHLORHEXIDINE GLUCONATE 0.12 % MT SOLN
15.0000 mL | Freq: Two times a day (BID) | OROMUCOSAL | Status: DC
  Administered 2018-04-09: 15 mL via OROMUCOSAL
  Filled 2018-04-07 (×2): qty 15

## 2018-04-07 MED ORDER — LORAZEPAM 1 MG PO TABS
1.0000 mg | ORAL_TABLET | Freq: Four times a day (QID) | ORAL | Status: DC | PRN
  Filled 2018-04-07: qty 1

## 2018-04-07 MED ORDER — ORAL CARE MOUTH RINSE
15.0000 mL | Freq: Two times a day (BID) | OROMUCOSAL | Status: DC
  Administered 2018-04-08 – 2018-04-12 (×5): 15 mL via OROMUCOSAL

## 2018-04-07 NOTE — Progress Notes (Signed)
Rehab Admissions Coordinator Note:  Per OT recommendation, this patient was screened by Nanine Means for appropriateness for an Inpatient Acute Rehab Consult.  At this time, we are recommending an Inpatient Rehab consult. AC will contact MD to request an IP Rehab Consult Order.   Nanine Means 04/07/2018, 1:13 PM  I can be reached at [phone].

## 2018-04-07 NOTE — Evaluation (Signed)
Clinical/Bedside Swallow Evaluation Patient Details  Name: Lindsay Lara MRN: [id number] Date of Birth: 11-19-2000  Today's Date: 04/07/2018 Time: SLP Start Time (ACUTE ONLY): 0940 SLP Stop Time (ACUTE ONLY): 0951 SLP Time Calculation (min) (ACUTE ONLY): 11 min  Past Medical History:  Past Medical History:  Diagnosis Date  . Acne   . ADHD (attention deficit hyperactivity disorder) 05/08/2012  . Anxiety   . Bipolar and related disorder (HCC) 09/18/2015  . Depression   . 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 Pneumonia at age 64  . Polysubstance abuse (HCC) 09/18/2015  . Scoliosis no treatment required  . Substance induced mood disorder (HCC) 09/18/2015   Past Surgical History: History reviewed. No pertinent surgical history. HPI:  18 yr old female w/ PMHx ADHD, Anxiety, Bipolar depression, previous suicide attempts, Polysubstance abuse (cocaine, Marijuana, heroin and ETOH) presents via EMS after Mom found pt unresponsive. Bradycardic and hypotensive. Intubated 1/16-1/20, reintubated several hours later 1/20- 1/24 (self extubated), reintubated 1/24-1/29. CXR 1/28 Stable hardware positioning and bilateral pneumonia.  She has now recieved a trach - Shiley #6 cuffed   Assessment / Plan / Recommendation Clinical Impression  Clinical swallowing evaluation was completed using ice chips only due to nausea/vomitting.   Since patient was last evaluated she has now had a Shiley #6 cuffed trach placed.  Nursing was present and administering medications via the NG and patient was noted to "belch" and vomit up what appeared to be phlegm several times.   Cranial nerve exam was completed and unemarkable.  Lingual, labial, facial and jaw range of motion and strength were adequate.  Cough and vocal quality was unable to be assessed.  Given some ice chips the patient was noted to orally manipulate them and trigger a pharyngeal swallow.  Delayed cough was seen, however, it was diffiuclt to discern if  related to aspiration.  Given patient's history she is at risk for aspiration.  She declined further PO's after the ice chip presentation due to nausea/vomitting.  Recommend allowing a few ice chips per hour with nursing only following oral care.  ST will follow up to re assess the patient's swallow.     SLP Visit Diagnosis: Dysphagia, unspecified (R13.10)    Aspiration Risk  Moderate aspiration risk;Severe aspiration risk    Diet Recommendation   NPO except a few ice chips per hour with nursing following oral care.    Medication Administration: Via alternative means    Other  Recommendations Oral Care Recommendations: Oral care QID Other Recommendations: Have oral suction available   Follow up Recommendations Other (comment)(TBD)      Frequency and Duration min 2x/week  2 weeks       Prognosis Prognosis for Safe Diet Advancement: Good      Swallow Study   General Date of Onset: 03/22/18 HPI: 18 yr old female w/ PMHx ADHD, Anxiety, Bipolar depression, previous suicide attempts, Polysubstance abuse (cocaine, Marijuana, heroin and ETOH) presents via EMS after Mom found pt unresponsive. Bradycardic and hypotensive. Intubated 1/16-1/20, reintubated several hours later 1/20- 1/24 (self extubated), reintubated 1/24-1/29. CXR 1/28 Stable hardware positioning and bilateral pneumonia.  She has now recieved a trach - Shiley #6 cuffed Type of Study: Bedside Swallow Evaluation Previous Swallow Assessment: During this hospital stay. Diet Prior to this Study: NG Tube Temperature Spikes Noted: No Respiratory Status: Trach Collar History of Recent Intubation: Yes Length of Intubations (days): 14 days( 14 days total over course of 3 intubations) Date extubated: 04/05/18(when trach  placed) Behavior/Cognition: Alert;Cooperative;Other (Comment)(Labile) Oral Cavity Assessment: Within Functional Limits Oral Care Completed by SLP: No Oral Cavity - Dentition: Adequate natural dentition Vision:  Functional for self-feeding Self-Feeding Abilities: Able to feed self Patient Positioning: Upright in bed Baseline Vocal Quality: Not observed(due to trach) Volitional Cough: Other (Comment)(Pt unable due to trach)    Oral/Motor/Sensory Function Overall Oral Motor/Sensory Function: Within functional limits   Ice Chips Ice chips: Impaired Presentation: Spoon Pharyngeal Phase Impairments: Cough - Delayed   Thin Liquid Thin Liquid: Not tested    Nectar Thick Nectar Thick Liquid: Not tested   Honey Thick Honey Thick Liquid: Not tested   Puree Puree: Not tested   Solid     Solid: Not tested      Dimas Aguas, MA, CCC-SLP Acute Rehab SLP [phone]  Fleet Contras 04/07/2018,10:03 AM

## 2018-04-07 NOTE — Progress Notes (Signed)
eLink Physician-Brief Progress Note Patient Name: Lindsay Lara DOB: 2000/07/21 MRN: [id number]   Date of Service  04/07/2018  HPI/Events of Note  Nursing questions placement of CorTrak.   eICU Interventions  Will order abdominal film to assess placement.      Intervention Category Major Interventions: Other:  [name] 04/07/2018, 8:37 PM

## 2018-04-07 NOTE — Progress Notes (Signed)
eLink Physician-Brief Progress Note Patient Name: Lindsay Lara DOB: 02-Oct-2000 MRN: [id number]   Date of Service  04/07/2018  HPI/Events of Note  Review of abdominal film reveals CorTrak tip to be in duodenum. Patient spitting up feeds. Family will not let nursing give enteral medication. May of her medications can't be changed over to IV (Seroquel and acyclovir).   eICU Interventions  Will order: 1. D/C Ativan per tube Q 6 hours PRN. 2. Versed 0.5 mg IV Q 6 hours PRN anxiety.      Intervention Category Intermediate Interventions: Diagnostic test evaluation  Sommer,Steven Eugene 04/07/2018, 10:10 PM

## 2018-04-07 NOTE — Progress Notes (Signed)
Elink notified of PRN PO ativan being wasted in sink. While pushing meds, patient is burping and gagging. Tube feeds are on hold d/t this. Will continue to monitor.

## 2018-04-07 NOTE — Evaluation (Signed)
Passy-Muir Speaking Valve - Evaluation Patient Details  Name: Lindsay Lara MRN: [id number] Date of Birth: [birth year]  Today's Date: 04/07/2018 Time: 0925-0940 SLP Time Calculation (min) (ACUTE ONLY): 15 min  Past Medical History:  Past Medical History:  Diagnosis Date  . Acne   . ADHD (attention deficit hyperactivity disorder) 05/08/2012  . Anxiety   . Bipolar and related disorder (HCC) 09/18/2015  . Depression   . 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 Pneumonia at age 63  . Polysubstance abuse (HCC) 09/18/2015  . Scoliosis no treatment required  . Substance induced mood disorder (HCC) 09/18/2015   Past Surgical History: History reviewed. No pertinent surgical history. HPI:  18 yr old female w/ PMHx ADHD, Anxiety, Bipolar depression, previous suicide attempts, Polysubstance abuse (cocaine, Marijuana, heroin and ETOH) presents via EMS after Mom found pt unresponsive. Bradycardic and hypotensive. Intubated 1/16-1/20, reintubated several hours later 1/20- 1/24 (self extubated), reintubated 1/24-1/29. CXR 1/28 Stable hardware positioning and bilateral pneumonia.  She has now recieved a trach - Shiley #6 cuffed   Assessment / Plan / Recommendation Clinical Impression  The patient was seen for a PMSV evaluation following placement of a shiley #6 cuffed 04/05/2018.  She was on a trach collar but has been on the vent overnight.  Cuff was deflated with good tolerance.  Limited coughing seen.  Patient with no anxiety.  No phonation heard following cuff deflation.  Attempted finger occlusion and the patient did not appear to redirect any air into the upper respiratory tract.  No phonation was heard and signiicant back pressure was felt.  Given this the PMSV was not placed.  It should be used with ST only at this time.  ST will follow up to assess for readiness for use of PMSV.   SLP Visit Diagnosis: Aphonia (R49.1)    SLP Assessment  Patient needs continued Speech Lanaguage Pathology  Services    Follow Up Recommendations  Other (comment)(TBD)    Frequency and Duration min 2x/week  2 weeks    PMSV Trial PMSV was placed for: (It was not placed due to no upper respiratory airflow.  ) Able to redirect subglottic air through upper airway: No Able to Attain Phonation: No   Tracheostomy Tube       Vent Dependency       Cuff Deflation Trial  GO Tolerated Cuff Deflation: Yes Length of Time for Cuff Deflation Trial: 5 minutes Behavior: Alert;Cooperative;Labile        Fleet Contras 04/07/2018, 10:13 AM  Dimas Aguas, MA, CCC-SLP Acute Rehab SLP [phone]

## 2018-04-07 NOTE — Evaluation (Addendum)
Occupational Therapy Evaluation Patient Details Name: Lindsay Lara MRN: [id number] DOB: 2000-03-20 Today's Date: 04/07/2018    History of Present Illness 18 yr old female w/ PMHx ADHD, Anxiety, Bipolar depression, previous suicide attempts, Polysubstance abuse (cocaine, Marijuana, heroin and ETOH) presents via EMS after Mom found pt unresponsive. Bradycardic and hypotensive. Intubated 1/16-1/20, reintubated several hours later 1/20- 1/24 (self extubated), reintubated 1/24-1/29.   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 Pt currently requiring Min A for UB ADLs and Min A +2 for LB ADLs and functional transfers. Pt presenting with decreased strength, balance, and activity tolerance. Pt motivated to return to PLOF and participate in therapy, and pt with good family support. Pt will require further OT to increase occupational performance and safety with ADLs. Pending pt progress, recommend dc to CIR for intensive OT to optimize safety, independence with ADLs/IADLs, and return to PLOF.     Follow Up Recommendations  CIR;Supervision/Assistance - 24 hour    Equipment Recommendations  Tub/shower seat;Other (comment)(Defer to next venue)    Recommendations for Other Services PT consult;Speech consult;Rehab consult     Precautions / Restrictions Precautions Precautions: Fall(Suicide, NG tube, trach) Restrictions Weight Bearing Restrictions: No      Mobility Bed Mobility Overal bed mobility: Needs Assistance Bed Mobility: Supine to Sit     Supine to sit: Min assist;HOB elevated;+2 for safety/equipment     General bed mobility comments: Min  A for trunk support and posterior lean  Transfers Overall transfer level: Needs assistance Equipment used: 2 person hand held assist Transfers: Sit to/from [employer] Sit to Stand: Min assist;+2  safety/equipment Stand pivot transfers: Min assist;+2 physical assistance;+2 safety/equipment       General transfer comment: Min A +2 to power up and gain standing balance.     Balance Overall balance assessment: Needs assistance Sitting-balance support: No upper extremity supported;Feet supported Sitting balance-Leahy Scale: Fair Sitting balance - Comments: Min Guard A-Min A for sitting balance due to fatigue Postural control: Posterior lean   Standing balance-Leahy Scale: Poor Standing balance comment: Reilant on physical A                           ADL either performed or assessed with clinical judgement   ADL Overall ADL's : Needs assistance/impaired Eating/Feeding: NPO   Grooming: Minimal assistance;Sitting   Upper Body Bathing: Minimal assistance;Sitting   Lower Body Bathing: Minimal assistance;Sit to/from stand;+2 for physical assistance   Upper Body Dressing : Minimal assistance;Sitting   Lower Body Dressing: Minimal assistance;+2 for physical assistance;Sit to/from stand Lower Body Dressing Details (indicate cue type and reason): Pt able to bring her ankles to her knees for donning her socks while seated at EOB. Requiring Min A for sitting balance and presenting with posterior lean. Min A +2 for standing balance Toilet Transfer: Minimal assistance;+2 for physical assistance;Stand-pivot(simulated to recliner)           Functional mobility during ADLs: Minimal assistance;+2 for physical assistance;+2 for safety/equipment(stand pivot only) General ADL Comments: Pt with decreased balance, strength, and activity tolerance     Vision         Perception     Praxis      Pertinent Vitals/Pain Pain Assessment: 0-10 Pain Score: 6  Pain Location: Neck/Trach Pain Descriptors / Indicators: Constant;Discomfort Pain Intervention(s): Monitored during session;Limited activity  within patient's tolerance;Repositioned     Hand Dominance Right    Extremity/Trunk Assessment Upper Extremity Assessment Upper Extremity Assessment: Generalized weakness   Lower Extremity Assessment Lower Extremity Assessment: Generalized weakness   Cervical / Trunk Assessment Cervical / Trunk Assessment: Other exceptions Cervical / Trunk Exceptions: Forward flexion of head   Communication Communication Communication: Tracheostomy   Cognition Arousal/Alertness: Awake/alert Behavior During Therapy: Flat affect(Rolling eyes) Overall Cognitive Status: Difficult to assess                                 General Comments: Pt following simple commands and able to answer yes/no questions appropriately   General Comments  Mom present throughout. SpO2 100% on trach collar. HR elevating to 150s and RR 40s duirng activity. At rest, HR 121 and RR 20s.     Exercises     Shoulder Instructions      Home Living Family/patient expects to be discharged to:: Private residence Living Arrangements: Parent Available Help at Discharge: Family;Available 24 hours/day Type of Home: House Home Access: Stairs to enter Enter[employer] CorporationEntrance Stairs-Number of Steps: 1-1 Entrance Stairs-Rails: None Home Layout: Two level;Bed/bath upstairs Alternate Level Stairs-Number of Steps: Flight Alternate Level Stairs-Rails: Left Bathroom Shower/Tub: IT trainerTub/shower unit;Curtain   Bathroom Toilet: Standard     Home Equipment: None   Additional Comments: Mom planning for pt to dc with her and home information collected from her      Prior Functioning/Environment Level of Independence: Independent        Comments: ADLs, IADLs (does not drive due to DUI), has a dog, 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        OT Problem List: Decreased strength;Decreased range of motion;Decreased activity tolerance;Impaired balance (sitting and/or standing);Decreased knowledge of use of DME or AE;Decreased knowledge of precautions;Pain      OT Treatment/Interventions:  Self-care/ADL training;Therapeutic exercise;Energy conservation;DME and/or AE instruction;Therapeutic activities;Patient/family education    OT Goals(Current goals can be found in the care plan section) Acute Rehab OT Goals Patient Stated Goal: Unstated; mom wants her to return home OT Goal Formulation: With patient/family Time For Goal Achievement: 04/21/18 Potential to Achieve Goals: Good ADL Goals Pt Will Perform Grooming: with modified independence;standing Pt Will Perform Upper Body Dressing: with modified independence;sitting Pt Will Perform Lower Body Dressing: with modified independence;sit to/from stand Pt Will Transfer to Toilet: with modified independence;ambulating;regular height toilet Pt Will Perform Toileting - Clothing Manipulation and hygiene: with modified independence;sit to/from stand;sitting/lateral leans Pt Will Perform Tub/Shower Transfer: Tub transfer;with supervision;ambulating;shower seat  OT Frequency: Min 2X/week   Barriers to D/C:            Co-evaluation              AM-PAC OT "6 Clicks" Daily Activity     Outcome Measure Help from another person eating meals?: Total Help from another person taking care of personal grooming?: A Little Help from another person toileting, which includes using toliet, bedpan, or urinal?: A Little Help from another person bathing (including washing, rinsing, drying)?: A Little Help from another person to put on and taking off regular upper body clothing?: A Little Help from another person to put on and taking off regular lower body clothing?: A Little 6 Click Score: 16   End of Session Equipment Utilized During Treatment: Gait belt;Oxygen Nurse Communication: Mobility status  Activity Tolerance: Patient tolerated treatment well Patient left: in chair;with call bell/phone within reach;with chair  alarm set;with family/visitor present;with nursing/sitter in room  OT Visit Diagnosis: Unsteadiness on feet  (R26.81);Other abnormalities of gait and mobility (R26.89);Muscle weakness (generalized) (M62.81);Pain Pain - part of body: (Trach placement)                Time: 1010-1034 OT Time Calculation (min): 24 min Charges:  OT General Charges $OT Visit: 1 Visit OT Evaluation $OT Eval Moderate Complexity: 1 Mod OT Treatments $Self Care/Home Management : 8-22 mins  Azile Minardi MSOT, OTR/L Acute Rehab Pager: [phone] Office: [phone]  Theodoro Grist Mcclellan Demarais 04/07/2018, 12:41 PM

## 2018-04-07 NOTE — Progress Notes (Signed)
Patient attempting to get out of bed. In B/L wrist restraints but will sit straight up, at 90 degree angle, and reach for cortrack and/or trach. Called into room by sitter. Restraints readjusted, precedex increased, and prn Haldol given. Patient also placed on bedpan in hopes that is whats wrong. No bowel movement noted. After 10-15 minutes patient is finally calm and resting in bed. Will continue to monitor.

## 2018-04-07 NOTE — Progress Notes (Signed)
NAME:  Lindsay Lara, MRN:  [id number], DOB:  01/23/2001, LOS: 17 ADMISSION DATE:  03/21/2018, CONSULTATION DATE:  03/21/2018 REFERRING MD:  Dr. [name] CarlScheider CHIEF COMPLAINT:  Altered Mental Status    Brief History   18 yr old female w/ PMHx ADHD, Anxiety, Bipolar depression,previous suicide attempts,Polysubstance abuse (cocaine, Marijuana,heroin and ETOH) presentsvia EMS after Mom found pt unresponsive. Bradycardic to 40sandhypotensive with SBP in the 40s. Intubated.Given Glucagon.Started on epi gtt. PCCM consulted for Intentional OD (propanolol, xanax and Seroquel)  Significant Hospital Events   1/16>Admission  1/16>Seen by Neurology - MRI for possible lateralizing signs  Consults:  PCCM Neurology   Procedures:  1/16> Intubated 1/16>CVL placed  1/17> chest tube placed 1/18>Bedside bronchoscopy , Therapeutic suctioning  1/19>extubated>reintubated 1/24>extubated 1/24>reintubated  1/29> extubated  1/31> Trach  Significant Diagnostic Tests:  CT head: Unremarkable  EKG: RBBB, Prolonged QTc 528 EEG: possible right focal cerebral dysfunction. No clear seizure activity. MRI: Negative for acute infarct or mass, Question diffuse cortical edema right hemisphere on axial T2 images.While this could be due to artifact, consider seizure related cortical edema. EEG 1/23: no seizure activity   Micro Data:  1/18 Urine Cx> 1/18 Urinalysis> 1/18 Blood Cx> 1/18 Tracheal Aspirate Cx> Rare GNR, pending sensitivity/cx  1/21 Repeat Resp culture > rare candida  1/24 Sputum > normal resp flora  1/25 Blood > NGTD  Antimicrobials:  Zosyn 1/18 >1/19 Vancomycin 1/18 >1/19, 1/26>1/27 Unasyn 1/19>1/26 Ampicillin 1/21> 1/22 Cefepime 1/26 >1/31  Interim history/subjective:  Tolerated trach collar yesterday. However required full vent support overnight. Currently on trach collar and in no acute distress. Reading in chair. Mom reports thin bloody secretions occasionally  Objective   Blood  pressure 117/61, pulse 95, temperature 98.4 F (36.9 C), temperature source Oral, resp. rate 16, height 5\' 5"  (1.651 m), weight 47.6 kg, last menstrual period 03/21/2018, SpO2 100 %.    Vent Mode: PRVC FiO2 (%):  [35 %-40 %] 35 % Set Rate:  [18 bmp] 18 bmp Vt Set:  [450 mL] 450 mL PEEP:  [5 cmH20] 5 cmH20 Plateau Pressure:  [18 cmH20] 18 cmH20   Intake/Output Summary (Last 24 hours) at 04/07/2018 1327 Last data filed at 04/07/2018 1200 Gross per 24 hour  Intake 1727.45 ml  Output 1550 ml  Net 177.45 ml   Filed Weights   04/05/18 0500 04/06/18 0425 04/07/18 0500  Weight: 46.7 kg 47.4 kg 47.6 kg   Physical Exam: General: Chronically ill-appearing female, sitting in chair, NAD HENT: [place], AT, OP clear, MMM Neck: trach midline, sutures in place, no surrounding erythema, mild tenderness Eyes: EOMI, no scleral icterus Respiratory: Decreased breath sounds bilaterally, mild basilar rhonchi, no wheezing Cardiovascular: RRR, -M/R/G, no JVD GI: BS+, soft, nontender Extremities:-Edema,-tenderness Neuro: AAO x4, CNII-XII grossly intact Skin: Intact, no rashes or bruising Psych: Normal mood, normal affect GU: Foley in place   Resolved Hospital Problem list   Left apical Pneumothorax   Acute  respiratory failure secondary to respiratory depression from intentional overdose  Assessment & Plan:  18 yr old female w/ PMHx ADHD, Anxiety, Bipolar depression,previous suicide attempts,Polysubstance abuse (cocaine, Marijuana,heroin and ETOH) presentsfollowing intentional drug overdose with Propranolol and Seroquel. Utox positive for benzos and THC.   ARDS/Aspiration pneumonia s/p tracheostomy 1/31 - S/p Cefepime x5d for aspiration pneumonia - Trach collar trials as tolerated - Vent support as needed - PT/OT/Speech  Acute encephalopathy secondary to delirium and mental health diagnosis as noted below - Remains on Precedex. Plan to wean off. - Continue oxycodone 10  mg q6h - Continue scheduled  Ativan 1mg  q6h. Change to PO - Continue atarax 25 q8h, seroquel 300 BID and Clonidine 0.3mg  q6h   Suicide Attempt  Hx prior SI and attempts Hx polysubstance abuse ADHD Bipolar and anxiety disorder - Psych consult - On sertraline  Hx genital herpes - Continue home prophylaxis : acyclovir. Transition to PO when able  Constipation - glycerin suppository prn  - sennokot scheduled when able to take po   Best practice:  Diet:Tube feedings DVT prophylaxis:Lovenox GI prophylaxis:none Mobility: OOB as tolerated, referred to IP rehab Code Status:FULL Family Communication: Updated patient and mom at bedside  Labs   CBC: Recent Labs  Lab 04/03/18 0405 04/04/18 0500  04/05/18 0513 04/05/18 0944 04/05/18 1220 04/06/18 0422 04/07/18 0519  WBC 10.4 12.3*  --  13.8*  --   --  11.0* 9.3  NEUTROABS  --   --   --   --   --   --   --  4.3  HGB 8.6* 10.1*   < > 9.4* 9.2* 9.5* 8.7* 9.2*  HCT 27.9* 31.4*   < > 29.8* 27.0* 28.0* 27.5* 29.2*  MCV 92.4 92.1  --  91.4  --   --  92.6 91.3  PLT 410* 560*  --  616*  --   --  618* 602*   < > = values in this interval not displayed.    Basic Metabolic Panel: Recent Labs  Lab 03/31/18 1726  04/02/18 0403  04/03/18 0405 04/04/18 0500  04/05/18 0513 04/05/18 0944 04/05/18 1220 04/06/18 0422 04/07/18 0519  NA  --    < > 139   < > 141 139   < > 138 137 137 138 136  K  --    < > 3.4*   < > 3.9 4.0   < > 3.0* 2.9* 3.2* 3.9 4.1  CL  --    < > 99   < > 98 99  --  98  --   --  105 102  CO2  --    < > 31   < > 32 29  --  29  --   --  24 26  GLUCOSE  --    < > 94   < > 88 83  --  80  --   --  111* 87  BUN  --    < > 12   < > 16 14  --  23*  --   --  22* 13  CREATININE  --    < > 0.64   < > 0.67 0.68  --  0.83  --   --  0.59 0.43*  CALCIUM  --    < > 7.5*   < > 8.2* 8.5*  --  8.6*  --   --  8.2* 8.5*  MG 1.8  --  1.7  --  2.0 2.2  --   --   --   --  2.0  --   PHOS 3.8  --   --   --   --   --   --   --   --   --   --   --    < > = values in  this interval not displayed.   GFR: Estimated Creatinine Clearance: 85 mL/min (A) (by C-G formula based on SCr of 0.43 mg/dL (L)). Recent Labs  Lab 04/04/18 0500 04/05/18 0513 04/06/18 0422 04/07/18 0519  WBC 12.3* 13.8*  11.0* 9.3    Liver Function Tests: Recent Labs  Lab 04/03/18 0405 04/06/18 0422 04/07/18 0519  AST 34 48* 41  ALT 46* 46* 50*  ALKPHOS 150* 104 105  BILITOT 0.5 0.2* 0.5  PROT 5.2* 5.6* 5.7*  ALBUMIN 1.8* 2.4* 2.4*   No results for input(s): LIPASE, AMYLASE in the last 168 hours. No results for input(s): AMMONIA in the last 168 hours.  ABG    Component Value Date/Time   PHART 7.584 (H) 04/05/2018 1220   PCO2ART 28.2 (L) 04/05/2018 1220   PO2ART 130.0 (H) 04/05/2018 1220   HCO3 26.7 04/05/2018 1220   TCO2 28 04/05/2018 1220   ACIDBASEDEF 1.0 03/28/2018 0420   O2SAT 99.0 04/05/2018 1220     Coagulation Profile: Recent Labs  Lab 04/05/18 0513  INR 1.13    Cardiac Enzymes: No results for input(s): CKTOTAL, CKMB, CKMBINDEX, TROPONINI in the last 168 hours.  HbA1C: Hgb A1c MFr Bld  Date/Time Value Ref Range Status  02/10/2018 06:55 AM 5.0 4.8 - 5.6 % Final    Comment:    (NOTE) Pre diabetes:          5.7%-6.4% Diabetes:              >6.4% Glycemic control for   <7.0% adults with diabetes   09/17/2015 06:42 PM 5.2 4.8 - 5.6 % Final    Comment:    (NOTE)         Pre-diabetes: 5.7 - 6.4         Diabetes: >6.4         Glycemic control for adults with diabetes: <7.0     CBG: Recent Labs  Lab 04/06/18 1103 04/06/18 1756 04/06/18 2328 04/07/18 0600 04/07/18 1145  GLUCAP 95 96 104* 110* 91    Mechele CollinJane Loda Bialas, M.D. [facility] Pager: [phone] After hours pager: [phone]

## 2018-04-08 ENCOUNTER — Inpatient Hospital Stay (HOSPITAL_COMMUNITY): Payer: PRIVATE HEALTH INSURANCE

## 2018-04-08 ENCOUNTER — Encounter (HOSPITAL_COMMUNITY): Payer: Self-pay | Admitting: Physical Medicine and Rehabilitation

## 2018-04-08 DIAGNOSIS — G931 Anoxic brain damage, not elsewhere classified: Secondary | ICD-10-CM

## 2018-04-08 DIAGNOSIS — T447X2S Poisoning by beta-adrenoreceptor antagonists, intentional self-harm, sequela: Secondary | ICD-10-CM

## 2018-04-08 LAB — CBC WITH DIFFERENTIAL/PLATELET
Abs Immature Granulocytes: 0.07 10*3/uL (ref 0.00–0.07)
Basophils Absolute: 0.1 10*3/uL (ref 0.0–0.1)
Basophils Relative: 2 %
Eosinophils Absolute: 0.9 10*3/uL — ABNORMAL HIGH (ref 0.0–0.5)
Eosinophils Relative: 10 %
HCT: 34 % — ABNORMAL LOW (ref 36.0–46.0)
Hemoglobin: 10.6 g/dL — ABNORMAL LOW (ref 12.0–15.0)
IMMATURE GRANULOCYTES: 1 %
LYMPHS PCT: 36 %
Lymphs Abs: 3 10*3/uL (ref 0.7–4.0)
MCH: 28.4 pg (ref 26.0–34.0)
MCHC: 31.2 g/dL (ref 30.0–36.0)
MCV: 91.2 fL (ref 80.0–100.0)
MONOS PCT: 10 %
Monocytes Absolute: 0.8 10*3/uL (ref 0.1–1.0)
Neutro Abs: 3.4 10*3/uL (ref 1.7–7.7)
Neutrophils Relative %: 41 %
Platelets: 700 10*3/uL — ABNORMAL HIGH (ref 150–400)
RBC: 3.73 MIL/uL — ABNORMAL LOW (ref 3.87–5.11)
RDW: 13.3 % (ref 11.5–15.5)
WBC: 8.2 10*3/uL (ref 4.0–10.5)
nRBC: 0 % (ref 0.0–0.2)

## 2018-04-08 LAB — COMPREHENSIVE METABOLIC PANEL
ALT: 47 U/L — ABNORMAL HIGH (ref 0–44)
AST: 31 U/L (ref 15–41)
Albumin: 2.8 g/dL — ABNORMAL LOW (ref 3.5–5.0)
Alkaline Phosphatase: 108 U/L (ref 38–126)
Anion gap: 11 (ref 5–15)
BUN: 13 mg/dL (ref 6–20)
CO2: 24 mmol/L (ref 22–32)
Calcium: 9.2 mg/dL (ref 8.9–10.3)
Chloride: 104 mmol/L (ref 98–111)
Creatinine, Ser: 0.6 mg/dL (ref 0.44–1.00)
GFR calc Af Amer: >60 mL/min (ref >60)
GFR calc non Af Amer: >60 mL/min (ref >60)
GLUCOSE: 93 mg/dL (ref 70–99)
Potassium: 3.8 mmol/L (ref 3.5–5.1)
Sodium: 139 mmol/L (ref 135–145)
Total Bilirubin: 0.5 mg/dL (ref 0.3–1.2)
Total Protein: 6.3 g/dL — ABNORMAL LOW (ref 6.5–8.1)

## 2018-04-08 LAB — GLUCOSE, CAPILLARY
GLUCOSE-CAPILLARY: 90 mg/dL (ref 70–99)
Glucose-Capillary: 98 mg/dL (ref 70–99)
Glucose-Capillary: 77 mg/dL (ref 70–99)
Glucose-Capillary: 87 mg/dL (ref 70–99)
Glucose-Capillary: 109 mg/dL — ABNORMAL HIGH (ref 70–99)

## 2018-04-08 MED ORDER — MIDAZOLAM HCL 2 MG/2ML IJ SOLN
INTRAMUSCULAR | Status: AC
  Administered 2018-04-08: 2 mg via INTRAVENOUS
  Filled 2018-04-08: qty 2

## 2018-04-08 MED ORDER — CLONIDINE HCL 0.3 MG/24HR TD PTWK
0.3000 mg | MEDICATED_PATCH | TRANSDERMAL | Status: DC
  Administered 2018-04-08 – 2018-04-15 (×2): 0.3 mg via TRANSDERMAL
  Filled 2018-04-08 (×2): qty 1

## 2018-04-08 MED ORDER — MIDAZOLAM HCL 2 MG/2ML IJ SOLN
2.0000 mg | Freq: Once | INTRAMUSCULAR | Status: AC
  Administered 2018-04-08: 2 mg via INTRAVENOUS

## 2018-04-08 NOTE — Progress Notes (Signed)
Per Dr. Isaiah Serge, OK to start trickle feeds.

## 2018-04-08 NOTE — Progress Notes (Signed)
  Speech Language Pathology Treatment: Dysphagia  Patient Details Name: Lindsay Lara MRN: [id number] DOB: [birth year] Today's Date: 04/08/2018 Time: 5825-1898 SLP Time Calculation (min) (ACUTE ONLY): 8 min  Assessment / Plan / Recommendation Clinical Impression  Pt seen for PO trials given pt inability to tolerate bolus feeds via Cortrak. Did not address PMSV this session as pt has anxiety and needs nutrition goals met as soon as possible for med intake. Pt demonstrated adequate oral motor strength, was able to self feed. Endorsed baseline throat pain but was agreeable to small bites of puree as she would like to take her medications to address her anxiety. Appearance of normal oral function, subjectively swallow also appeared normal (swift, strong single swallows) with no report of pain or sensation of residue. Assured mother of decreased risk of aspiration of a puree texture than a liquid texture in this situation. Goal is for pt to take meds crushed orally with f/u MBS at 1130 to potentially transition to oral intake without further need for Cortrak. RN and mother agreeable to plan, but still with concern for pt tolerance.   HPI HPI: 18 yr old female w/ PMHx ADHD, Anxiety, Bipolar depression, previous suicide attempts, Polysubstance abuse (cocaine, Marijuana, heroin and ETOH) presents via EMS after Mom found pt unresponsive. Bradycardic and hypotensive. Intubated 1/16-1/20, reintubated several hours later 1/20- 1/24 (self extubated), reintubated 1/24-1/29. CXR 1/28 Stable hardware positioning and bilateral pneumonia.  She has now recieved a trach - Shiley #6 cuffed      SLP Plan  MBS       Recommendations  Medication Administration: Crushed with puree                Oral Care Recommendations: Oral care QID Plan: MBS       GO               Herbie Baltimore, MA CCC-SLP  Acute Rehabilitation Services Pager [phone] Office [phone]  Lynann Beaver 04/08/2018,  9:28 AM

## 2018-04-08 NOTE — Consult Note (Signed)
Attempted to see patient today but appeared uncomfortable in bed. Parents at bedside report that she was having difficulty breathing and asked this notewriter to come back later. Will attempt to evaluate patient tomorrow.   Juanetta Beets, DO 04/08/18 3:00 PM

## 2018-04-08 NOTE — Progress Notes (Signed)
EEG complete - results pending 

## 2018-04-08 NOTE — Consult Note (Signed)
Physical Medicine and Rehabilitation Consult   Reason for Consult: Debility/encephalopathy Referring Physician: Dr.    HPI: Lindsay Lara is a 18 y.o. female with history of anxiety disorder, bipolar depression, prior suicide attempts, polysubstance abuse--recent voluntary detox 02/2018; who was admitted on 01/16/202020 after being found unresponsive at home due to suicide attempt from overdose of Seroquel, propranolol and "street xanax"   She was started on pressors due to hypotension with bradycardia, given glucagon, was intubated for airway protection and had seizure type activity in ED. Neurology recommended supportive care and MRI brain done revealing diffuse cortical edema in right hemisphere question seizure related.  Neurology felt seizure activity likely due to benzo withdrawal and she was started on taper.  Hospital course significant for left PTX treated with CT, issues with agitation and failure of extubation attempts due to ARDS/aspiration PNA and mucous plugging.   She failed extubation attempts X 2 and tracheostomy placed 1/31 by Brett Canales Minor NP.  She was extubated to ATC yesterday and therapy evaluations completed. In process of getting beside swallow this am and showing good tolerance.   Per Mother--lot of anxiety issues at baseline.  Review of Systems  Unable to perform ROS: Patient nonverbal     Past Medical History:  Diagnosis Date  . Acne   . ADHD (attention deficit hyperactivity disorder) 05/08/2012  . Anxiety   . Bipolar and related disorder (HCC) 09/18/2015  . Depression   . 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 Pneumonia at age 13  . Polysubstance abuse (HCC) 09/18/2015  . Scoliosis no treatment required  . Substance induced mood disorder (HCC) 09/18/2015    History reviewed. No pertinent surgical history.    Family History  Problem Relation Age of Onset  . Alcoholism Father        and Paterna grandfather  . Heart disease Father   .  Hyperlipidemia Father   . Drug abuse Paternal Uncle     Social History:  Lives with mother.  Out of HS--Volunteers otherwise sedentary. Does not drive. Per  reports that she has been smoking cigarettes. She has been smoking about 0.50 packs per day. She uses E cigarettes on occasion-- has never used smokeless tobacco. She reports current alcohol use. She reports current drug use. Drugs: Marijuana, Benzodiazepines, and Cocaine.    Allergies  Allergen Reactions  . Latex Other (See Comments)    Irritation     Medications Prior to Admission  Medication Sig Dispense Refill  . cloNIDine (CATAPRES) 0.2 MG tablet Take 1 tablet (0.2 mg total) by mouth 3 (three) times daily as needed (anxiety). 60 tablet 0  . gabapentin (NEURONTIN) 300 MG capsule Take 2 capsules (600 mg total) by mouth 2 (two) times daily. For agitation (Patient taking differently: Take 400 mg by mouth 2 (two) times daily as needed (agitation). ) 120 capsule 0  . hydrOXYzine (ATARAX/VISTARIL) 25 MG tablet Take 1 tablet (25 mg total) by mouth 3 (three) times daily as needed for anxiety. (Patient taking differently: Take 50 mg by mouth every 6 (six) hours as needed for anxiety. ) 60 tablet 0  . levonorgestrel-ethinyl estradiol (INTROVALE) 0.15-0.03 MG tablet Take 1 tablet by mouth daily. Birth control method. 1 tablet 0  . QUEtiapine (SEROQUEL) 300 MG tablet Take 1 tablet (300 mg total) by mouth at bedtime. For mood control 30 tablet 0  . valACYclovir (VALTREX) 1000 MG tablet Take 1 tablet (1,000 mg total) by mouth daily. May  increase to 2 tablets twice daily for 5 days during an outbreak. 1 tablet 0    Home: Home Living Family/patient expects to be discharged to:: Private residence Living Arrangements: Parent Available Help at Discharge: Family, Available 24 hours/day Type of Home: House Home Access: Stairs to enter Secretary/administratorntrance Stairs-Number of Steps: 1-1 Entrance Stairs-Rails: None Home Layout: Two level, Bed/bath  upstairs Alternate Level Stairs-Number of Steps: Flight Alternate Level Stairs-Rails: Left Bathroom Shower/Tub: Tub/shower unit, Engineer, building servicesCurtain Bathroom Toilet: Standard Home Equipment: None Additional Comments: Mom planning for pt to dc with her and home information collected from her  Functional History: Prior Function Level of Independence: Independent Comments: ADLs, IADLs (does not drive due to DUI), has a dog, 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       ADL: ADL Overall ADL's : Needs assistance/impaired Eating/Feeding: NPO Grooming: Minimal assistance, Sitting Upper Body Bathing: Minimal assistance, Sitting Lower Body Bathing: Minimal assistance, Sit to/from stand, +2 for physical assistance Upper Body Dressing : Minimal assistance, Sitting Lower Body Dressing: Minimal assistance, +2 for physical assistance, Sit to/from stand Lower Body Dressing Details (indicate cue type and reason): Pt able to bring her ankles to her knees for donning her socks while seated at EOB. Requiring Min A for sitting balance and presenting with posterior lean. Min A +2 for standing balance Toilet Transfer: Minimal assistance, +2 for physical assistance, Stand-pivot(simulated to recliner) Functional mobility during ADLs: Minimal assistance, +2 for physical assistance, +2 for safety/equipment(stand pivot  only) General ADL Comments: Pt with decreased balance, strength, and activity tolerance  Cognition: Cognition Overall Cognitive Status: Difficult to assess Orientation Level: Oriented X4 Cognition Arousal/Alertness: Awake/alert Behavior During Therapy: Flat affect(Rolling eyes) Overall Cognitive Status: Difficult to assess General Comments: Pt following simple commands and able to answer yes/no questions appropriately Difficult to assess due to: Tracheostomy   Blood pressure (!) 167/96, pulse 98, temperature (!) 97.5 F (36.4 C), temperature source Oral, resp. rate (!) 32, height 5\' 5"  (1.651 m), weight 47.6 kg, last menstrual period 03/21/2018, SpO2 100 %. Physical Exam  Nursing note and vitals reviewed. Constitutional: She appears well-developed and well-nourished.  HENT:  Head: Atraumatic.  Eyes: Pupils are equal, round, and reactive to light.  Neck:  Cuffed #6 trach in place.   Cardiovascular: Normal rate.  Respiratory: Effort normal.  GI: Soft.  Neurological:  Flat affect with poor interaction. Falling asleep during exam --MMT limited by inconsistent effort. UE grossly 2-3/5. LE 2-3/5. Senses pain in all 4's. Follows basic commands.   Skin: Skin is warm.  Multiple lacs/scars/abrasions  Psychiatric:  flat    Results for orders placed or performed during the hospital encounter of 03/21/18 (from the past 24 hour(s))  Glucose, capillary     Status: None   Collection Time: 04/07/18 11:45 AM  Result Value Ref Range   Glucose-Capillary 91 70 - 99 mg/dL  Glucose, capillary     Status: None   Collection Time: 04/07/18  6:08 PM  Result Value Ref  Range   Glucose-Capillary 79 70 - 99 mg/dL  Glucose, capillary     Status: None   Collection Time: 04/07/18 10:38 PM  Result Value Ref Range   Glucose-Capillary 98 70 - 99 mg/dL  Comprehensive metabolic panel     Status: Abnormal   Collection Time: 04/08/18  4:43 AM  Result Value Ref Range   Sodium 139 135 - 145 mmol/L    Potassium 3.8 3.5 - 5.1 mmol/L   Chloride 104 98 - 111 mmol/L   CO2 24 22 - 32 mmol/L   Glucose, Bld 93 70 - 99 mg/dL   BUN 13 6 - 20 mg/dL   Creatinine, Ser 1.610.60 0.44 - 1.00 mg/dL   Calcium 9.2 8.9 - 09.610.3 mg/dL   Total Protein 6.3 (L) 6.5 - 8.1 g/dL   Albumin 2.8 (L) 3.5 - 5.0 g/dL   AST 31 15 - 41 U/L   ALT 47 (H) 0 - 44 U/L   Alkaline Phosphatase 108 38 - 126 U/L   Total Bilirubin 0.5 0.3 - 1.2 mg/dL   GFR calc non Af Amer >60 >60 mL/min   GFR calc Af Amer >60 >60 mL/min   Anion gap 11 5 - 15  CBC with Differential/Platelet     Status: Abnormal   Collection Time: 04/08/18  4:43 AM  Result Value Ref Range   WBC 8.2 4.0 - 10.5 K/uL   RBC 3.73 (L) 3.87 - 5.11 MIL/uL   Hemoglobin 10.6 (L) 12.0 - 15.0 g/dL   HCT 04.534.0 (L) 40.936.0 - 81.146.0 %   MCV 91.2 80.0 - 100.0 fL   MCH 28.4 26.0 - 34.0 pg   MCHC 31.2 30.0 - 36.0 g/dL   RDW 91.413.3 78.211.5 - 95.615.5 %   Platelets 700 (H) 150 - 400 K/uL   nRBC 0.0 0.0 - 0.2 %   Neutrophils Relative % 41 %   Neutro Abs 3.4 1.7 - 7.7 K/uL   Lymphocytes Relative 36 %   Lymphs Abs 3.0 0.7 - 4.0 K/uL   Monocytes Relative 10 %   Monocytes Absolute 0.8 0.1 - 1.0 K/uL   Eosinophils Relative 10 %   Eosinophils Absolute 0.9 (H) 0.0 - 0.5 K/uL   Basophils Relative 2 %   Basophils Absolute 0.1 0.0 - 0.1 K/uL   Immature Granulocytes 1 %   Abs Immature Granulocytes 0.07 0.00 - 0.07 K/uL  Glucose, capillary     Status: None   Collection Time: 04/08/18  7:12 AM  Result Value Ref Range   Glucose-Capillary 77 70 - 99 mg/dL   Dg Abd Portable 1v  Result Date: 04/07/2018 CLINICAL DATA:  Evaluate feeding tube position placed yesterday. EXAM: PORTABLE ABDOMEN - 1 VIEW COMPARISON:  04/05/2018 FINDINGS: Feeding tube tip projected over the mid abdomen consistent with location in the third portion of the duodenum. Mildly gas distended stomach. Gas-filled small and large bowel without abnormal distention. Tracheostomy present. Left central venous catheter with tip over the lower  SVC region. No visible pneumothorax. IMPRESSION: Feeding tube tip projected over the mid abdomen consistent with location in the third portion of the duodenum. Electronically Signed   By: Burman NievesWilliam  Stevens M.D.   On: 04/07/2018 21:37     Assessment/Plan: Diagnosis: toxic/anoxic encephalopathy 1. Does the need for close, 24 hr/day medical supervision in concert with the patient's rehab needs make it unreasonable for this patient to be served in a less intensive setting? Yes 2. Co-Morbidities requiring supervision/potential complications: bipolar/anxiety/depression, substance abuse, seizures, ARDS/pneumonia 3. Due  to bladder management, bowel management, safety, skin/wound care, disease management, medication administration, pain management and patient education, does the patient require 24 hr/day rehab nursing? Yes 4. Does the patient require coordinated care of a physician, rehab nurse, PT (1-2 hrs/day, 5 days/week), OT (1-2 hrs/day, 5 days/week) and SLP (1-2 hrs/day, 5 days/week) to address physical and functional deficits in the context of the above medical diagnosis(es)? Yes Addressing deficits in the following areas: balance, endurance, locomotion, strength, transferring, bowel/bladder control, bathing, dressing, feeding, grooming, toileting, cognition, swallowing and psychosocial support 5. Can the patient actively participate in an intensive therapy program of at least 3 hrs of therapy per day at least 5 days per week? Yes 6. The potential for patient to make measurable gains while on inpatient rehab is excellent 7. Anticipated functional outcomes upon discharge from inpatient rehab are supervision  with PT, supervision with OT, supervision with SLP. 8. Estimated rehab length of stay to reach the above functional goals is: 10-13 days 9. Anticipated D/C setting: Home 10. Anticipated post D/C treatments: HH therapy 11. Overall Rehab/Functional Prognosis: good  RECOMMENDATIONS: This patient's  condition is appropriate for continued rehabilitative care in the following setting: CIR Patient has agreed to participate in recommended program. Potentially Note that insurance prior authorization may be required for reimbursement for recommended care.  Comment: Rehab Admissions Coordinator to follow up.  Thanks,  Ranelle Oyster, MD, [place] [facility]  I have personally performed a face to face diagnostic evaluation of this patient. Additionally, I have reviewed and concur with the physician assistant's documentation above.    Jacquelynn Cree, PA-C 04/08/2018

## 2018-04-08 NOTE — Progress Notes (Signed)
NAME:  Lindsay Lara, MRN:  [id number], DOB:  04-10-2000, LOS: 18 ADMISSION DATE:  03/21/2018, CONSULTATION DATE:  03/21/2018 REFERRING MD:  Dr. [name] CarlScheider CHIEF COMPLAINT:  Altered Mental Status    Brief History   18 yr old female w/ PMHx ADHD, Anxiety, Bipolar depression,previous suicide attempts,Polysubstance abuse (cocaine, Marijuana,heroin and ETOH) presentsvia EMS after Mom found pt unresponsive. Bradycardic to 40sandhypotensive with SBP in the 40s. Intubated.Given Glucagon.Started on epi gtt. PCCM consulted for Intentional OD (propanolol, xanax and Seroquel)  Significant Hospital Events   1/16>Admission  1/16>Seen by Neurology - MRI for possible lateralizing signs  Consults:  PCCM Neurology   Procedures:  1/16> Intubated 1/16>CVL placed  1/17> chest tube placed 1/18>Bedside bronchoscopy , Therapeutic suctioning  1/19>extubated>reintubated 1/24>extubated 1/24>reintubated  1/29> extubated  1/31> Trach  Significant Diagnostic Tests:  CT head: Unremarkable  EKG: RBBB, Prolonged QTc 528 EEG: possible right focal cerebral dysfunction. No clear seizure activity. MRI: Negative for acute infarct or mass, Question diffuse cortical edema right hemisphere on axial T2 images.While this could be due to artifact, consider seizure related cortical edema. EEG 1/23: no seizure activity   Micro Data:  1/18 Urine Cx> 1/18 Urinalysis> 1/18 Blood Cx> 1/18 Tracheal Aspirate Cx> Rare GNR, pending sensitivity/cx  1/21 Repeat Resp culture > rare candida  1/24 Sputum > normal resp flora  1/25 Blood > NGTD  Antimicrobials:  Zosyn 1/18 >1/19 Vancomycin 1/18 >1/19, 1/26>1/27 Unasyn 1/19>1/26 Ampicillin 1/21> 1/22 Cefepime 1/26 >1/31  Interim history/subjective:  Agitated today with rigidity in the neck, rhythmic movements of the left. Symptoms improved with IV Versed and Dilaudid. P.o. meds were on hold due to issues with cortrak.  Nurse reports patient is gagging and burping when  meds are pushed through feeding tube. Was on trach mask today morning.  Now back on vent  Objective   Blood pressure (!) 167/96, pulse 98, temperature (!) 97.5 F (36.4 C), temperature source Oral, resp. rate (!) 32, height 5\' 5"  (1.651 m), weight 47.6 kg, last menstrual period 03/21/2018, SpO2 100 %.    FiO2 (%):  [35 %] 35 %   Intake/Output Summary (Last 24 hours) at 04/08/2018 1023 Last data filed at 04/08/2018 0900 Gross per 24 hour  Intake 1007.63 ml  Output 950 ml  Net 57.63 ml   Filed Weights   04/06/18 0425 04/07/18 0500 04/08/18 0500  Weight: 47.4 kg 47.6 kg 47.6 kg   Physical Exam: Gen:      Chronically ill-appearing, anxious HEENT:  EOMI, sclera anicteric Neck:     No masses; no thyromegaly, trach Lungs:    Clear to auscultation bilaterally; normal respiratory effort CV:         Regular rate and rhythm; no murmurs Abd:      + bowel sounds; soft, non-tender; no palpable masses, no distension Ext:    No edema; adequate peripheral perfusion Skin:      Warm and dry; no rash Neuro: Awake, follows commands.  Resolved Hospital Problem list   Left apical Pneumothorax   Acute  respiratory failure secondary to respiratory depression from intentional overdose  Assessment & Plan:  18 yr old female w/ PMHx ADHD, Anxiety, Bipolar depression,previous suicide attempts,Polysubstance abuse (cocaine, Marijuana,heroin and ETOH) presentsfollowing intentional drug overdose with Propranolol and Seroquel. Utox positive for benzos and THC.   ARDS/Aspiration pneumonia s/p tracheostomy 1/31 Finished antibiotics for aspiration pneumonia Trach collar trials as tolerated. PT, speech therapy. Routine trach care  Acute encephalopathy secondary to delirium and mental health diagnosis as noted  below Suspect she may be withdrawing from opiates. Continue Dilaudid drip, wean off Haldol as needed, hydroxyzine, Versed as needed Seroquel, Zoloft  Suicide Attempt  Hx prior SI and attempts Hx  polysubstance abuse ADHD Bipolar and anxiety disorder Psychiatry consult  Hx genital herpes Continue home prophylaxis with acyclovir  Constipation Bowel regimen   Assess feeding tube Patient reportedly burping and gagging with tube feeds Chronic trach appears to be in good pressure Tube feeds currently on hold.  Awaiting barium swallow.  Best practice:  Diet:Tube feedings DVT prophylaxis:Lovenox GI prophylaxis:none Mobility: OOB as tolerated, referred to IP rehab Code Status:FULL Family Communication:  Mom updated at bedside.  Labs   CBC: Recent Labs  Lab 04/04/18 0500  04/05/18 0513 04/05/18 0944 04/05/18 1220 04/06/18 0422 04/07/18 0519 04/08/18 0443  WBC 12.3*  --  13.8*  --   --  11.0* 9.3 8.2  NEUTROABS  --   --   --   --   --   --  4.3 3.4  HGB 10.1*   < > 9.4* 9.2* 9.5* 8.7* 9.2* 10.6*  HCT 31.4*   < > 29.8* 27.0* 28.0* 27.5* 29.2* 34.0*  MCV 92.1  --  91.4  --   --  92.6 91.3 91.2  PLT 560*  --  616*  --   --  618* 602* 700*   < > = values in this interval not displayed.    Basic Metabolic Panel: Recent Labs  Lab 04/02/18 0403  04/03/18 0405 04/04/18 0500  04/05/18 0513 04/05/18 0944 04/05/18 1220 04/06/18 0422 04/07/18 0519 04/08/18 0443  NA 139   < > 141 139   < > 138 137 137 138 136 139  K 3.4*   < > 3.9 4.0   < > 3.0* 2.9* 3.2* 3.9 4.1 3.8  CL 99   < > 98 99  --  98  --   --  105 102 104  CO2 31   < > 32 29  --  29  --   --  24 26 24   GLUCOSE 94   < > 88 83  --  80  --   --  111* 87 93  BUN 12   < > 16 14  --  23*  --   --  22* 13 13  CREATININE 0.64   < > 0.67 0.68  --  0.83  --   --  0.59 0.43* 0.60  CALCIUM 7.5*   < > 8.2* 8.5*  --  8.6*  --   --  8.2* 8.5* 9.2  MG 1.7  --  2.0 2.2  --   --   --   --  2.0  --   --    < > = values in this interval not displayed.   GFR: Estimated Creatinine Clearance: 85 mL/min (by C-G formula based on SCr of 0.6 mg/dL). Recent Labs  Lab 04/05/18 0513 04/06/18 0422 04/07/18 0519 04/08/18 0443   WBC 13.8* 11.0* 9.3 8.2    Liver Function Tests: Recent Labs  Lab 04/03/18 0405 04/06/18 0422 04/07/18 0519 04/08/18 0443  AST 34 48* 41 31  ALT 46* 46* 50* 47*  ALKPHOS 150* 104 105 108  BILITOT 0.5 0.2* 0.5 0.5  PROT 5.2* 5.6* 5.7* 6.3*  ALBUMIN 1.8* 2.4* 2.4* 2.8*   No results for input(s): LIPASE, AMYLASE in the last 168 hours. No results for input(s): AMMONIA in the last 168 hours.  ABG    Component  Value Date/Time   PHART 7.584 (H) 04/05/2018 1220   PCO2ART 28.2 (L) 04/05/2018 1220   PO2ART 130.0 (H) 04/05/2018 1220   HCO3 26.7 04/05/2018 1220   TCO2 28 04/05/2018 1220   ACIDBASEDEF 1.0 03/28/2018 0420   O2SAT 99.0 04/05/2018 1220     Coagulation Profile: Recent Labs  Lab 04/05/18 0513  INR 1.13    Cardiac Enzymes: No results for input(s): CKTOTAL, CKMB, CKMBINDEX, TROPONINI in the last 168 hours.  HbA1C: Hgb A1c MFr Bld  Date/Time Value Ref Range Status  02/10/2018 06:55 AM 5.0 4.8 - 5.6 % Final    Comment:    (NOTE) Pre diabetes:          5.7%-6.4% Diabetes:              >6.4% Glycemic control for   <7.0% adults with diabetes   09/17/2015 06:42 PM 5.2 4.8 - 5.6 % Final    Comment:    (NOTE)         Pre-diabetes: 5.7 - 6.4         Diabetes: >6.4         Glycemic control for adults with diabetes: <7.0     CBG: Recent Labs  Lab 04/07/18 0600 04/07/18 1145 04/07/18 1808 04/07/18 2238 04/08/18 0712  GLUCAP 110* 91 79 98 77    The patient is critically ill with multiple organ system failure and requires high complexity decision making for assessment and support, frequent evaluation and titration of therapies, advanced monitoring, review of radiographic studies and interpretation of complex data.   Critical Care Time devoted to patient care services, exclusive of separately billable procedures, described in this note is 35 minutes.   Chilton Greathouse MD [facility] Pager [phone] If no answer call [phone]  [phone] 04/08/2018, 10:23 AM
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
## 2018-04-08 NOTE — Procedures (Signed)
[facility] Lindsay Lara A. Gerilyn Pilgrim, MD     [email or web address]           HISTORY: 18 year old female who presents with episode of drug overdose and unresponsiveness.  There is a history of severe psychiatric problems and polysubstance drug abuse.  MEDICATIONS: Scheduled Meds: . acyclovir  400 mg Per Tube BID  . chlorhexidine  15 mL Mouth Rinse BID  . Chlorhexidine Gluconate Cloth  6 each Topical Q0600  . cloNIDine  0.3 mg Oral Q6H  . enoxaparin (LOVENOX) injection  40 mg Subcutaneous Q24H  . fentaNYL (SUBLIMAZE) injection  200 mcg Intravenous Once  . folic acid  1 mg Oral Daily  . hydrOXYzine  25 mg Oral Q8H  . mouth rinse  15 mL Mouth Rinse q12n4p  . oxyCODONE  10 mg Oral Q6H  . pantoprazole (PROTONIX) IV  40 mg Intravenous Q24H  . QUEtiapine  300 mg Oral BID  . sertraline  50 mg Per Tube Daily  . sodium chloride flush  10-40 mL Intracatheter Q12H  . thiamine  100 mg Per Tube Daily   Continuous Infusions: . sodium chloride 10 mL/hr at 04/08/18 1000  . sodium chloride Stopped (04/01/18 0915)  . dexmedetomidine (PRECEDEX) IV infusion 2 mcg/kg/hr (04/08/18 1151)  . feeding supplement (VITAL AF 1.2 CAL) Stopped (04/07/18 1900)  . HYDROmorphone 6 mg/hr (04/08/18 0954)  . propofol (DIPRIVAN) infusion Stopped (04/06/18 0955)   PRN Meds:.Place/Maintain arterial line **AND** sodium chloride, acetaminophen (TYLENOL) oral liquid 160 mg/5 mL, acetaminophen, albuterol, diphenhydrAMINE, haloperidol lactate, metoprolol tartrate, midazolam, ondansetron (ZOFRAN) IV, sennosides, sodium chloride flush  Prior to Admission medications   Medication Sig Start Date End Date Taking? Authorizing Provider  cloNIDine (CATAPRES) 0.2 MG tablet Take 1 tablet (0.2 mg total) by mouth 3 (three) times daily as needed (anxiety). 02/11/18  Yes Armandina Stammer I, NP  gabapentin (NEURONTIN) 300 MG capsule Take 2 capsules (600 mg total) by mouth 2 (two) times daily. For agitation Patient taking differently:  Take 400 mg by mouth 2 (two) times daily as needed (agitation).  02/11/18  Yes Armandina Stammer I, NP  hydrOXYzine (ATARAX/VISTARIL) 25 MG tablet Take 1 tablet (25 mg total) by mouth 3 (three) times daily as needed for anxiety. Patient taking differently: Take 50 mg by mouth every 6 (six) hours as needed for anxiety.  02/11/18  Yes Armandina Stammer I, NP  levonorgestrel-ethinyl estradiol (INTROVALE) 0.15-0.03 MG tablet Take 1 tablet by mouth daily. Birth control method. 02/11/18  Yes Armandina Stammer I, NP  QUEtiapine (SEROQUEL) 300 MG tablet Take 1 tablet (300 mg total) by mouth at bedtime. For mood control 02/11/18  Yes Nwoko, Nicole Kindred I, NP  valACYclovir (VALTREX) 1000 MG tablet Take 1 tablet (1,000 mg total) by mouth daily. May increase to 2 tablets twice daily for 5 days during an outbreak. 02/11/18  Yes Armandina Stammer I, NP      ANALYSIS: A 16 channel recording using standard 10 20 measurements is conducted for 22 minutes.  There is a well-formed posterior dominant rhythm of 9.5 Hz which attenuates with eye opening.  There is beta activity observed in frontal areas.  Awake and drowsy activities are observed.  For examination and hyperventilation are not conducted.  There are multiple episodes of brief frontal and generalized intermittent rhythmic delta activity.  There are also a few episodes of triphasic waves.  No focal or lateralized slowing is observed.  No epileptiform activities are observed.   IMPRESSION: 1.  There is multiple episodes of brief frontal  and generalized intermittent rhythmic delta activity.  These are typically seen in toxic metabolic encephalopathies. 2.  Few episodes of triphasic waves typically seen in encephalopathies due to renal or hepatic impairment.      Jamal Pavon A. Gerilyn Pilgrim, M.D.  Diplomate, Biomedical engineer of Psychiatry and Neurology ( Neurology).

## 2018-04-09 ENCOUNTER — Inpatient Hospital Stay (HOSPITAL_COMMUNITY): Payer: PRIVATE HEALTH INSURANCE

## 2018-04-09 DIAGNOSIS — T1491XA Suicide attempt, initial encounter: Secondary | ICD-10-CM

## 2018-04-09 LAB — CBC
HCT: 30.7 % — ABNORMAL LOW (ref 36.0–46.0)
Hemoglobin: 9.2 g/dL — ABNORMAL LOW (ref 12.0–15.0)
MCH: 28.1 pg (ref 26.0–34.0)
MCHC: 30 g/dL (ref 30.0–36.0)
MCV: 93.9 fL (ref 80.0–100.0)
PLATELETS: 554 10*3/uL — AB (ref 150–400)
RBC: 3.27 MIL/uL — ABNORMAL LOW (ref 3.87–5.11)
RDW: 13.7 % (ref 11.5–15.5)
WBC: 7 10*3/uL (ref 4.0–10.5)
nRBC: 0 % (ref 0.0–0.2)

## 2018-04-09 LAB — BASIC METABOLIC PANEL
Anion gap: 11 (ref 5–15)
BUN: 15 mg/dL (ref 6–20)
CO2: 24 mmol/L (ref 22–32)
Calcium: 8.8 mg/dL — ABNORMAL LOW (ref 8.9–10.3)
Chloride: 103 mmol/L (ref 98–111)
Creatinine, Ser: 0.62 mg/dL (ref 0.44–1.00)
GFR calc Af Amer: >60 mL/min (ref >60)
GFR calc non Af Amer: >60 mL/min (ref >60)
Glucose, Bld: 113 mg/dL — ABNORMAL HIGH (ref 70–99)
Potassium: 4.4 mmol/L (ref 3.5–5.1)
Sodium: 138 mmol/L (ref 135–145)

## 2018-04-09 LAB — GLUCOSE, CAPILLARY
Glucose-Capillary: 108 mg/dL — ABNORMAL HIGH (ref 70–99)
Glucose-Capillary: 115 mg/dL — ABNORMAL HIGH (ref 70–99)
Glucose-Capillary: 87 mg/dL (ref 70–99)

## 2018-04-09 LAB — PHOSPHORUS: Phosphorus: 4.6 mg/dL (ref 2.5–4.6)

## 2018-04-09 LAB — MAGNESIUM: Magnesium: 1.9 mg/dL (ref 1.7–2.4)

## 2018-04-09 MED ORDER — MIDAZOLAM HCL 2 MG/2ML IJ SOLN
2.0000 mg | Freq: Once | INTRAMUSCULAR | Status: AC
  Administered 2018-04-09: 2 mg via INTRAVENOUS

## 2018-04-09 MED ORDER — QUETIAPINE FUMARATE 25 MG PO TABS
50.0000 mg | ORAL_TABLET | Freq: Three times a day (TID) | ORAL | Status: DC | PRN
  Administered 2018-04-09 – 2018-04-12 (×8): 50 mg via ORAL
  Filled 2018-04-09 (×11): qty 1

## 2018-04-09 MED ORDER — OXYCODONE HCL 5 MG PO TABS: 20.0000 mg | ORAL_TABLET | Freq: Four times a day (QID) | ORAL | Status: DC

## 2018-04-09 MED ORDER — OXYCODONE HCL 5 MG PO TABS
20.0000 mg | ORAL_TABLET | Freq: Four times a day (QID) | ORAL | Status: DC
  Administered 2018-04-09 – 2018-04-11 (×7): 20 mg
  Filled 2018-04-09 (×7): qty 4

## 2018-04-09 MED ORDER — HYDROXYZINE HCL 25 MG PO TABS
25.0000 mg | ORAL_TABLET | Freq: Three times a day (TID) | ORAL | Status: DC
  Administered 2018-04-09 – 2018-04-11 (×6): 25 mg
  Filled 2018-04-09 (×7): qty 1

## 2018-04-09 MED ORDER — GABAPENTIN 400 MG PO CAPS
400.0000 mg | ORAL_CAPSULE | Freq: Two times a day (BID) | ORAL | Status: DC
  Filled 2018-04-09: qty 1

## 2018-04-09 MED ORDER — OXYCODONE HCL 5 MG PO TABS
10.0000 mg | ORAL_TABLET | Freq: Four times a day (QID) | ORAL | Status: DC
  Administered 2018-04-09: 10 mg
  Filled 2018-04-09: qty 2

## 2018-04-09 MED ORDER — GABAPENTIN 400 MG PO CAPS
400.0000 mg | ORAL_CAPSULE | Freq: Two times a day (BID) | ORAL | Status: DC
  Administered 2018-04-09 – 2018-04-17 (×16): 400 mg via ORAL
  Filled 2018-04-09 (×18): qty 1

## 2018-04-09 MED ORDER — QUETIAPINE FUMARATE 300 MG PO TABS
300.0000 mg | ORAL_TABLET | Freq: Two times a day (BID) | ORAL | Status: DC
  Administered 2018-04-09 – 2018-04-11 (×5): 300 mg
  Filled 2018-04-09 (×5): qty 1

## 2018-04-09 MED ORDER — PANTOPRAZOLE SODIUM 40 MG PO PACK
40.0000 mg | PACK | Freq: Every day | ORAL | Status: DC
  Administered 2018-04-09 – 2018-04-10 (×2): 40 mg
  Filled 2018-04-09 (×2): qty 20

## 2018-04-09 MED ORDER — MIDAZOLAM HCL 2 MG/2ML IJ SOLN
INTRAMUSCULAR | Status: AC
  Filled 2018-04-09: qty 2

## 2018-04-09 MED ORDER — CLONAZEPAM 1 MG PO TABS
1.0000 mg | ORAL_TABLET | Freq: Two times a day (BID) | ORAL | Status: DC
  Administered 2018-04-09 – 2018-04-17 (×16): 1 mg via ORAL
  Filled 2018-04-09 (×16): qty 1

## 2018-04-09 MED ORDER — FOLIC ACID 1 MG PO TABS
1.0000 mg | ORAL_TABLET | Freq: Every day | ORAL | Status: DC
  Administered 2018-04-09 – 2018-04-11 (×3): 1 mg
  Filled 2018-04-09 (×3): qty 1

## 2018-04-09 MED ORDER — MIDAZOLAM HCL 2 MG/2ML IJ SOLN
2.0000 mg | INTRAMUSCULAR | Status: DC | PRN
  Administered 2018-04-09 – 2018-04-14 (×4): 2 mg via INTRAVENOUS
  Filled 2018-04-09 (×6): qty 2

## 2018-04-09 NOTE — Progress Notes (Signed)
NAME:  Lindsay Lara, MRN:  [id number], DOB:  Apr 01, 2000, LOS: 19 ADMISSION DATE:  03/21/2018, CONSULTATION DATE:  03/21/2018 REFERRING MD:  Dr. [name] CarlScheider CHIEF COMPLAINT:  Altered Mental Status    Brief History   18 yr old female w/ PMHx ADHD, Anxiety, Bipolar depression,previous suicide attempts,Polysubstance abuse (cocaine, Marijuana,heroin and ETOH) presentsvia EMS after Mom found pt unresponsive. Bradycardic to 40sandhypotensive with SBP in the 40s. Intubated.Given Glucagon.Started on epi gtt. PCCM consulted for Intentional OD (propanolol, xanax and Seroquel)  Significant Hospital Events   1/16>Admission  1/16>Seen by Neurology - MRI for possible lateralizing signs  Consults:  PCCM Neurology   Procedures:  1/16> Intubated 1/16>CVL placed  1/17> chest tube placed 1/18>Bedside bronchoscopy , Therapeutic suctioning  1/19>extubated>reintubated 1/24>extubated 1/24>reintubated  1/29> extubated  1/31> Trach  Significant Diagnostic Tests:  CT head: Unremarkable  EKG: RBBB, Prolonged QTc 528 EEG: possible right focal cerebral dysfunction. No clear seizure activity. MRI: Negative for acute infarct or mass, Question diffuse cortical edema right hemisphere on axial T2 images.While this could be due to artifact, consider seizure related cortical edema. 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 Wean from vent as tolerated Rest on ventilator nocturnally  Objective   Blood pressure 113/64, pulse 74, temperature 98.8 F (37.1 C), temperature source Oral, resp. rate 17, height 5\' 5"   (1.651 m), weight 48.1 kg, last menstrual period 03/21/2018, SpO2 100 %.    Vent Mode: PRVC FiO2 (%):  [35 %-40 %] 35 % Set Rate:  [18 bmp] 18 bmp Vt Set:  [450 mL] 450 mL PEEP:  [5 cmH20] 5 cmH20 Plateau Pressure:  [18 cmH20-19 cmH20] 18 cmH20   Intake/Output Summary (Last 24 hours) at 04/09/2018 0837 Last data filed at 04/09/2018 0600 Gross per 24 hour  Intake 854.98 ml  Output 850 ml  Net 4.98 ml   Filed Weights   04/07/18 0500 04/08/18 0500 04/09/18 0500  Weight: 47.6 kg 47.6 kg 48.1 kg   Physical Exam: General: Frail female no acute distress HEENT: Tracheostomy is unremarkable Neuro: Follows commands.  Having episodes what appears to be a pseudoseizure contraction of the hands but she is able to follow commands moves all extremities and shift her gaze during these episodes. CV: s1s2 rrr, no m/r/g PULM: even/non-labored, lungs bilaterally clear MW:NUUVGI:soft, non-tender, bsx4 active  Extremities: warm/dry, negative edema  Skin: no rashes or lesions   Resolved Hospital Problem list   Left apical Pneumothorax   Acute  respiratory failure secondary to respiratory depression from intentional overdose  Assessment & Plan:  18 yr old female w/ PMHx ADHD, Anxiety, Bipolar depression,previous suicide attempts,Polysubstance abuse (cocaine, Marijuana,heroin and ETOH) presentsfollowing intentional drug overdose with Propranolol and Seroquel. Utox positive for benzos and THC.   ARDS/Aspiration pneumonia s/p tracheostomy 1/31 Trach collar as tolerated Nocturnal ventilatory support Physical and Occupational Therapy Mobilize out of bed as tolerated  Acute encephalopathy secondary to delirium and mental health diagnosis as noted below Suspect she may be withdrawing from opiates.  EEG with diffuse changes consistent with metabolic encephalopathy..  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 Dilaudid off Continue PRN oxycodone PRN Versed Monitor for any true seizure activity  Suicide Attempt  Hx  prior SI and attempts Hx polysubstance abuse ADHD Bipolar and anxiety disorder Psychiatric consultants pending attempted on 04/08/2018 family requested delay Continue  Psychotropics PRN Versed Precedex Check twelve-lead for completeness due to Haldol use  Hx genital herpes Continuing home acyclovir  Constipation Bowel regimen as ordered   feeding tube Continue tube feedings as tolerated  Best practice:  Diet:Tube feedings DVT prophylaxis:Lovenox GI prophylaxis:none Mobility: OOB as tolerated, referred to IP rehab Code Status:FULL Family Communication:  04/09/2018 patient and mother updated at bedside.  Labs   CBC: Recent Labs  Lab 04/05/18 0513  04/05/18 1220 04/06/18 0422 04/07/18 0519 04/08/18 0443 04/09/18 0500  WBC 13.8*  --   --  11.0* 9.3 8.2 7.0  NEUTROABS  --   --   --   --  4.3 3.4  --   HGB 9.4*   < > 9.5* 8.7* 9.2* 10.6* 9.2*  HCT 29.8*   < > 28.0* 27.5* 29.2* 34.0* 30.7*  MCV 91.4  --   --  92.6 91.3 91.2 93.9  PLT 616*  --   --  618* 602* 700* 554*   < > = values in this interval not displayed.    Basic Metabolic Panel: Recent Labs  Lab 04/03/18 0405 04/04/18 0500  04/05/18 0513  04/05/18 1220 04/06/18 0422 04/07/18 0519 04/08/18 0443 04/09/18 0500  NA 141 139   < > 138   < > 137 138 136 139 138  K 3.9 4.0   < > 3.0*   < > 3.2* 3.9 4.1 3.8 4.4  CL 98 99  --  98  --   --  105 102 104 103  CO2 32 29  --  29  --   --  24 26 24 24   GLUCOSE 88 83  --  80  --   --  111* 87 93 113*  BUN 16 14  --  23*  --   --  22* 13 13 15   CREATININE 0.67 0.68  --  0.83  --   --  0.59 0.43* 0.60 0.62  CALCIUM 8.2* 8.5*  --  8.6*  --   --  8.2* 8.5* 9.2 8.8*  MG 2.0 2.2  --   --   --   --  2.0  --   --  1.9  PHOS  --   --   --   --   --   --   --   --   --  4.6   < > = values in this interval not displayed.   GFR: Estimated Creatinine Clearance: 85.9 mL/min (by C-G formula based on SCr of 0.62 mg/dL). Recent Labs  Lab 04/06/18 0422 04/07/18 0519  04/08/18 0443 04/09/18 0500  WBC 11.0* 9.3 8.2 7.0    Liver Function Tests: Recent Labs  Lab 04/03/18 0405 04/06/18 0422 04/07/18 0519 04/08/18 0443  AST 34 48* 41 31  ALT 46* 46* 50* 47*  ALKPHOS 150* 104 105 108  BILITOT 0.5 0.2* 0.5 0.5  PROT 5.2* 5.6* 5.7* 6.3*  ALBUMIN 1.8* 2.4* 2.4* 2.8*   No results for input(s): LIPASE, AMYLASE in the last 168 hours. No results for input(s): AMMONIA in the last 168 hours.  ABG    Component Value Date/Time   PHART 7.584 (H) 04/05/2018 1220   PCO2ART 28.2 (L) 04/05/2018 1220   PO2ART 130.0 (H) 04/05/2018 1220   HCO3 26.7 04/05/2018 1220   TCO2 28 04/05/2018 1220  ACIDBASEDEF 1.0 03/28/2018 0420   O2SAT 99.0 04/05/2018 1220     Coagulation Profile: Recent Labs  Lab 04/05/18 0513  INR 1.13    Cardiac Enzymes: No results for input(s): CKTOTAL, CKMB, CKMBINDEX, TROPONINI in the last 168 hours.  HbA1C: Hgb A1c MFr Bld  Date/Time Value Ref Range Status  02/10/2018 06:55 AM 5.0 4.8 - 5.6 % Final    Comment:    (NOTE) Pre diabetes:          5.7%-6.4% Diabetes:              >6.4% Glycemic control for   <7.0% adults with diabetes   09/17/2015 06:42 PM 5.2 4.8 - 5.6 % Final    Comment:    (NOTE)         Pre-diabetes: 5.7 - 6.4         Diabetes: >6.4         Glycemic control for adults with diabetes: <7.0     CBG: Recent Labs  Lab 04/07/18 2238 04/08/18 0712 04/08/18 1134 04/08/18 1757 04/08/18 2323  GLUCAP 98 77 90 87 109*    App cct 30 min  Brett Canales Pharrell Ledford ACNP [facility] PCCM Pager [phone] till 1 pm If no answer page [phone] 04/09/2018, 8:37 AM

## 2018-04-09 NOTE — Progress Notes (Signed)
Inpatient Rehabilitation Admissions Coordinator  I await further progress with therapy to assist with planning dispo options as appropriate. Noted psych consult pending.  Ottie Glazier, RN, MSN Rehab Admissions Coordinator [phone] 04/09/2018 10:45 AM

## 2018-04-09 NOTE — Progress Notes (Signed)
Pt w/ frequent bouts of "panic attacks" appearing vs seizure. Lindsay Lara Minor and pharmacy at bs to assess presentation and agree that it appears pseudo seizure. Resolved spontaneously between 0730 and 0830. Episode witnessed w/ NP and RX at 0840 accompanied by HR 160's, tachypnea in 30's to 40's, mildly spastic limb movement, tracking and + FC. Versed .5 mg given and zoloft and seroquel admined slightly early. Pt much more comfortable w/ s/s resolving after medication.

## 2018-04-09 NOTE — Progress Notes (Signed)
Pt tx to and back from radiology for barium swallow. Tx in bed. Tolerated px well. Mother accompanied and present for exam. Pt returned to unit w/ vss and RASS 0.

## 2018-04-09 NOTE — Progress Notes (Signed)
  Speech Language Pathology Treatment: Dysphagia;Passy Muir Speaking valve  Patient Details Name: Lindsay Lara MRN: [id number] DOB: [birth year] Today's Date: 04/09/2018 Time: 5697-9480 SLP Time Calculation (min) (ACUTE ONLY): 27 min  Assessment / Plan / Recommendation Clinical Impression  Pt tolerated slow cuff deflation with no coughing changes in vitals. Although phonation was not achieved with finger occlusion, back pressure was not noted as described in initial evaluation. PMV was placed to further assess for patency but still without evidence of upper air flow. Valve was removed after a few respiratory cycles with back pressure noted at that point. Suspect that edema could be playing a role, although also suspect that pt will likely need a smaller and/or cuffless trach.  Pt also consumed ice chips without overt signs of aspiration, although potential for silent aspiration is higher given multiple intubations, self-extubation, and acute trach. Pt's mother said that she ate two containers of applesauce on previous date without coughing or overt difficulty. Pt is eager for POs and would like to attempt MBS today - scheduled for this afternoon with radiology.   HPI HPI: 18 yr old female w/ PMHx ADHD, Anxiety, Bipolar depression, previous suicide attempts, Polysubstance abuse (cocaine, Marijuana, heroin and ETOH) presents via EMS after Mom found pt unresponsive. Bradycardic and hypotensive. Intubated 1/16-1/20, reintubated several hours later 1/20- 1/24 (self extubated), reintubated 1/24-1/29. CXR 1/28 Stable hardware positioning and bilateral pneumonia.  She has now recieved a trach - Shiley #6 cuffed      SLP Plan  MBS       Recommendations  Diet recommendations: NPO Medication Administration: Crushed with puree      Patient may use Passy-Muir Speech Valve: with SLP only MD: Please consider changing trach tube to : Smaller size         Oral Care Recommendations: Oral care QID Follow  up Recommendations: Inpatient Rehab SLP Visit Diagnosis: Aphonia (R49.1);Dysphagia, unspecified (R13.10) Plan: MBS       GO                Lindsay Lara 04/09/2018, 1:06 PM  Lindsay Lara, M.A. CCC-SLP Acute Herbalist [phone] Office [phone]

## 2018-04-09 NOTE — Progress Notes (Signed)
Modified Barium Swallow Progress Note  Patient Details  Name: Lindsay Lara MRN: [id number] Date of Birth: Jul 30, 2000  Today's Date: 04/09/2018  Modified Barium Swallow completed.  Full report located under Chart Review in the Imaging Section.  Brief recommendations include the following:  Clinical Impression  Pt's oropharyngeal swallow is WFL despite open trach given inability to wear PMV. No aspiration is observed, her swallow is timely, and she leaves trace to no residuals behind in her pharynx. Pt was not observed swallowing a barium tablet as she had a delayed cough and subjective c/o nausea (esophagus scanned without overt findings). Pt would be appropriate to start up to a regular diet with thin liquids, with careful adherence to aspiration precautions as she is not yet wearing her PMV. Given report of nausea, RN planned to speak with MD about if this diet can be started or if a clear liquid diet is preferred. SLP will f/u for PO tolerance and additional PMV trials.   Swallow Evaluation Recommendations       SLP Diet Recommendations: Regular solids;Thin liquid   Liquid Administration via: Cup;Straw   Medication Administration: Whole meds with liquid   Supervision: Patient able to self feed;Intermittent supervision to cue for compensatory strategies   Compensations: Slow rate;Small sips/bites   Postural Changes: Seated upright at 90 degrees   Oral Care Recommendations: Oral care BID        Virl Axe Ardis Lawley 04/09/2018,3:41 PM   Natalia Leatherwood, M.A. CCC-SLP Acute Herbalist [phone] Office [phone]

## 2018-04-09 NOTE — Progress Notes (Signed)
25 cc hydromorphone wasted in the sink; witnessed by Bascom Levels RN.

## 2018-04-10 ENCOUNTER — Inpatient Hospital Stay (HOSPITAL_COMMUNITY): Payer: PRIVATE HEALTH INSURANCE

## 2018-04-10 DIAGNOSIS — T1491XA Suicide attempt, initial encounter: Secondary | ICD-10-CM

## 2018-04-10 LAB — CBC WITH DIFFERENTIAL/PLATELET
Abs Immature Granulocytes: 0 10*3/uL (ref 0.00–0.07)
BAND NEUTROPHILS: 1 %
BLASTS: 1 %
Basophils Absolute: 0.1 10*3/uL (ref 0.0–0.1)
Basophils Relative: 1 %
Eosinophils Absolute: 0.7 10*3/uL — ABNORMAL HIGH (ref 0.0–0.5)
Eosinophils Relative: 8 %
HCT: 28.2 % — ABNORMAL LOW (ref 36.0–46.0)
Hemoglobin: 8.7 g/dL — ABNORMAL LOW (ref 12.0–15.0)
Lymphocytes Relative: 41 %
Lymphs Abs: 3.6 10*3/uL (ref 0.7–4.0)
MCH: 28.6 pg (ref 26.0–34.0)
MCHC: 30.9 g/dL (ref 30.0–36.0)
MCV: 92.8 fL (ref 80.0–100.0)
MONOS PCT: 4 %
Monocytes Absolute: 0.4 10*3/uL (ref 0.1–1.0)
Neutro Abs: 4 10*3/uL (ref 1.7–7.7)
Neutrophils Relative %: 44 %
Platelets: 548 10*3/uL — ABNORMAL HIGH (ref 150–400)
RBC: 3.04 MIL/uL — ABNORMAL LOW (ref 3.87–5.11)
RDW: 13.5 % (ref 11.5–15.5)
WBC: 8.8 10*3/uL (ref 4.0–10.5)
nRBC: 0 % (ref 0.0–0.2)

## 2018-04-10 LAB — BASIC METABOLIC PANEL
ANION GAP: 5 (ref 5–15)
BUN: 10 mg/dL (ref 6–20)
CO2: 28 mmol/L (ref 22–32)
Calcium: 8.9 mg/dL (ref 8.9–10.3)
Chloride: 107 mmol/L (ref 98–111)
Creatinine, Ser: 0.64 mg/dL (ref 0.44–1.00)
GFR calc Af Amer: >60 mL/min (ref >60)
GFR calc non Af Amer: >60 mL/min (ref >60)
Glucose, Bld: 117 mg/dL — ABNORMAL HIGH (ref 70–99)
POTASSIUM: 3.8 mmol/L (ref 3.5–5.1)
Sodium: 140 mmol/L (ref 135–145)

## 2018-04-10 LAB — GLUCOSE, CAPILLARY
Glucose-Capillary: 112 mg/dL — ABNORMAL HIGH (ref 70–99)
Glucose-Capillary: 127 mg/dL — ABNORMAL HIGH (ref 70–99)
Glucose-Capillary: 94 mg/dL (ref 70–99)

## 2018-04-10 LAB — MAGNESIUM: Magnesium: 2 mg/dL (ref 1.7–2.4)

## 2018-04-10 LAB — PHOSPHORUS: Phosphorus: 4.9 mg/dL — ABNORMAL HIGH (ref 2.5–4.6)

## 2018-04-10 MED ORDER — ENSURE ENLIVE PO LIQD
237.0000 mL | Freq: Three times a day (TID) | ORAL | Status: DC
  Administered 2018-04-10 – 2018-04-17 (×18): 237 mL via ORAL

## 2018-04-10 MED ORDER — VITAL AF 1.2 CAL PO LIQD
1000.0000 mL | ORAL | Status: DC
  Administered 2018-04-10: 1000 mL

## 2018-04-10 MED ORDER — LIP MEDEX EX OINT
TOPICAL_OINTMENT | CUTANEOUS | Status: DC | PRN
  Filled 2018-04-10 (×2): qty 7

## 2018-04-10 MED ORDER — VALACYCLOVIR HCL 500 MG PO TABS
1000.0000 mg | ORAL_TABLET | Freq: Every day | ORAL | Status: DC
  Administered 2018-04-11 – 2018-04-17 (×7): 1000 mg via ORAL
  Filled 2018-04-10 (×9): qty 2

## 2018-04-10 MED ORDER — VITAL AF 1.2 CAL PO LIQD: 1000.0000 mL | ORAL | Status: DC

## 2018-04-10 NOTE — Progress Notes (Signed)
Occupational Therapy Treatment Patient Details Name: Lindsay Lara MRN: [id number] DOB: 2000-09-03 Today's Date: 04/10/2018    History of present illness Pt is a 18 yr old female w/ PMHx ADHD, Anxiety, Bipolar depression, previous suicide attempts, Polysubstance abuse (cocaine, Marijuana, heroin and ETOH) presents via EMS after Mom found pt unresponsive. Bradycardic and hypotensive. Intubated 1/16-1/20, reintubated several hours later 1/20- 1/24 (self extubated), reintubated 1/24-1/29. Trach placed on 1/31.   OT comments  Pt progressing well towards established OT goals and is highly motivated to participate in therapy. Pt performed bed mobility with Min Guard A. Pt performing functional mobility with Min-Mod A +2 for safety and poor balance. Pt SpO2 >90s on 6L trach collar 28% and HR elevating to 140s during activity. Will continue to follow acutely as admitted and continue to recommend dc to CIR.    Follow Up Recommendations  CIR;Supervision/Assistance - 24 hour    Equipment Recommendations  Tub/shower seat;Other (comment)(Defer to next venue)    Recommendations for Other Services PT consult;Speech consult;Rehab consult    Precautions / Restrictions Precautions Precautions: Fall Precaution Comments: suicide, NG tube, trach Restrictions Weight Bearing Restrictions: No       Mobility Bed Mobility Overal bed mobility: Needs Assistance Bed Mobility: Supine to Sit     Supine to sit: Min guard     General bed mobility comments: min guard for safety, HOB elevated  Transfers Overall transfer level: Needs assistance Equipment used: None Transfers: Sit to/from Stand Sit to Stand: Min guard         General transfer comment: min guard for safety    Balance Overall balance assessment: Needs assistance Sitting-balance support: No upper extremity supported;Feet supported Sitting balance-Leahy Scale: Fair     Standing balance support: During functional activity;No upper  extremity supported;Single extremity supported Standing balance-Leahy Scale: Poor Standing balance comment: Reilant on physical A                           ADL either performed or assessed with clinical judgement   ADL Overall ADL's : Needs assistance/impaired                         Toilet Transfer: Min guard;Ambulation;Cueing for safety;Cueing for sequencing(simulated to recliner) Toilet Transfer Details (indicate cue type and reason): Min Guard A for safety         Functional mobility during ADLs: Minimal assistance;+2 for safety/equipment General ADL Comments: Pt demosntrating increased activity tolerance and very motivated to participate in therapy.      Vision       Perception     Praxis      Cognition Arousal/Alertness: Awake/alert Behavior During Therapy: WFL for tasks assessed/performed Overall Cognitive Status: Difficult to assess                                 General Comments: Pt following simple commands and able to answer yes/no questions appropriately        Exercises     Shoulder Instructions       General Comments Mom present throughout session. HR elevating to 140s, SpO2 >90% on 5-6L, RR 30s    Pertinent Vitals/ Pain       Pain Assessment: No/denies pain  Home Living Family/patient expects to be discharged to:: Private residence Living Arrangements: Parent Available Help at Discharge: Family;Available 24 hours/day Type of Home:  House Home Access: Stairs to enter Secretary/administratorntrance Stairs-Number of Steps: 1-1 Entrance Stairs-Rails: None Home Layout: Two level;Bed/bath upstairs Alternate Level Stairs-Number of Steps: Flight Alternate Level Stairs-Rails: Left Bathroom Shower/Tub: IT trainerTub/shower unit;Curtain   Bathroom Toilet: Standard     Home Equipment: None          Prior Functioning/Environment Level of Independence: Independent        Comments: ADLs, IADLs (does not drive due to DUI), has a dog,  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   Frequency  Min 2X/week        Progress Toward Goals  OT Goals(current goals can now be found in the care plan section)  Progress towards OT goals: Progressing toward goals  Acute Rehab OT Goals Patient Stated Goal: pt indicated that she wanted to walk again OT Goal Formulation: With patient/family Time For Goal Achievement: 04/21/18 Potential to Achieve Goals: Good ADL Goals Pt Will Perform Grooming: with modified independence;standing Pt Will Perform Upper Body Dressing: with modified independence;sitting Pt Will Perform Lower Body Dressing: with modified independence;sit to/from stand Pt Will Transfer to Toilet: with modified independence;ambulating;regular height toilet Pt Will Perform Toileting - Clothing Manipulation and hygiene: with modified independence;sit to/from stand;sitting/lateral leans Pt Will Perform Tub/Shower Transfer: Tub transfer;with supervision;ambulating;shower seat  Plan Discharge plan remains appropriate    Co-evaluation    PT/OT/SLP Co-Evaluation/Treatment: Yes Reason for Co-Treatment: Complexity of the patient's impairments (multi-system involvement);For patient/therapist safety;To address functional/ADL transfers PT goals addressed during session: Mobility/safety with mobility;Balance;Strengthening/ROM OT goals addressed during session: ADL's and self-care      AM-PAC OT "6 Clicks" Daily Activity     Outcome Measure   Help from another person eating meals?: Total Help from another person taking care of personal grooming?: A Little Help from another person toileting, which includes using toliet, bedpan, or urinal?: A Little Help from another person bathing (including washing, rinsing, drying)?: A Little Help from another person to put on and taking off regular upper body clothing?: A Little Help from another person to put on and taking off regular lower body clothing?: A Little 6 Click Score:  16    End of Session Equipment Utilized During Treatment: Gait belt;Oxygen  OT Visit Diagnosis: Unsteadiness on feet (R26.81);Other abnormalities of gait and mobility (R26.89);Muscle weakness (generalized) (M62.81);Pain   Activity Tolerance Patient tolerated treatment well   Patient Left in chair;with call bell/phone within reach;with family/visitor present   Nurse Communication Mobility status        Time: [id number] OT Time Calculation (min): 20 min  Charges: OT General Charges $OT Visit: 1 Visit OT Treatments $Self Care/Home Management : 8-22 mins  Earvin Blazier MSOT, OTR/L Acute Rehab Pager: [phone] Office: [phone]   Theodoro GristCharis M Danyella Mcginty 04/10/2018, 5:34 PM

## 2018-04-10 NOTE — Progress Notes (Signed)
  Speech Language Pathology Treatment: Dysphagia;Passy Muir Speaking valve  Patient Details Name: Lindsay Lara MRN: [id number] DOB: Feb 09, 2001 Today's Date: 04/10/2018 Time: 5784-6962 SLP Time Calculation (min) (ACUTE ONLY): 21 min  Assessment / Plan / Recommendation Clinical Impression  Pt was sitting upright in her chair with bright spirits today. Upper airway patency was assessed per pt request, as she is very motivated to speak again. No evidence of airflow was observed with digital occlusion and no phonation was produced. Education was provided to pt and mother about likely need for smaller trach, although potential laryngeal edema could be another complicating factor.   Pt also shares that her swallowing has been going well today, although her appetite has been low. She finished a bottle of Ensure and started eating an icee while SLP was present. No overt signs of dysphagia or aspiration were observed. She has already picked out some of her next meals and says she will try to increase her intake. SLP will continue to follow briefly for tolerance of PO diet, with focus of future therapy likely to be spent primarily on communication/PMV use.    HPI HPI: 18 yr old female w/ PMHx ADHD, Anxiety, Bipolar depression, previous suicide attempts, Polysubstance abuse (cocaine, Marijuana, heroin and ETOH) presents via EMS after Mom found pt unresponsive. Bradycardic and hypotensive. Intubated 1/16-1/20, reintubated several hours later 1/20- 1/24 (self extubated), reintubated 1/24-1/29. CXR 1/28 Stable hardware positioning and bilateral pneumonia.  She has now recieved a trach - Shiley #6 cuffed      SLP Plan  Continue with current plan of care       Recommendations  Diet recommendations: Regular;Thin liquid Liquids provided via: Cup;Straw Medication Administration: Whole meds with liquid Supervision: Patient able to self feed;Intermittent supervision to cue for compensatory  strategies Compensations: Slow rate;Small sips/bites Postural Changes and/or Swallow Maneuvers: Seated upright 90 degrees      Patient may use Passy-Muir Speech Valve: with SLP only PMSV Supervision: Full MD: Please consider changing trach tube to : Smaller size         Oral Care Recommendations: Oral care BID Follow up Recommendations: Inpatient Rehab SLP Visit Diagnosis: Dysphagia, unspecified (R13.10);Aphonia (R49.1) Plan: Continue with current plan of care       GO                [name] 04/10/2018, 5:17 PM  [name] [name], M.A. CCC-SLP Acute Herbalist [phone] Office [phone]

## 2018-04-10 NOTE — Progress Notes (Signed)
Pt w/ beginning s/s of "pseudoseizure" as described this am. Preparing to tx to radiology. Versed 0.5 mg given and pt w/ vss and complete resolution of s/s before tx to radiology.

## 2018-04-10 NOTE — Progress Notes (Addendum)
Nutrition Follow-up  DOCUMENTATION CODES:   Not applicable  INTERVENTION:    Ensure Enlive po TID, each supplement provides 350 kcal and 20 grams of protein  Decrease Vital AF 1.2 to 40 ml/h, run x 12 hours from 6 pm to 6 am to provide 576 kcal, 36 gm protein, 389 ml free water daily  When intake improves (consistently consuming >75% of meals) can d/c TF  NUTRITION DIAGNOSIS:   Inadequate oral intake related to decreased appetite as evidenced by per patient/family report.  Ongoing  GOAL:   Patient will meet greater than or equal to 90% of their needs  Met  MONITOR:   PO intake, Supplement acceptance  ASSESSMENT:   18 year old female with past medical hx of ADHD, anxiety, bipolar depression, previous suicide attempts, polysubstance abuse (cocaine, marijuana, heroin and alcohol abuse). She presented to the ED via EMS after mom found patient unresponsive. Bradycardic and hypotensive. Intubated. Given glucagon. Started on epi gtt. PCCM consulted for intentional OD (propanolol, xanax and Seroquel).  S/P tracheostomy 1/31. Currently on trach collar.   SLP following for PMV use and swallowing. Diet advanced to regular with thin liquids. Patient ate a little bit of oatmeal this morning. She c/o a decreased appetite. Provided patient with menu and discussed alternative menu options with patient. Nutritional Services ambassador to assist patient with meal options. She agreed to drink Ensure supplements between meals to maximize intake.   Cortrak in place (post-pyloric). Receiving Vital AF 1.2 at 55 ml/h providing 1584 kcal, 99 gm protein, 1071 ml free water daily.  Labs and medications reviewed.    Diet Order:   Diet Order            Diet regular Room service appropriate? Yes; Fluid consistency: Thin  Diet effective now              EDUCATION NEEDS:   No education needs have been identified at this time  Skin:  Skin Assessment: Skin Integrity Issues: Skin Integrity  Issues:: Stage II Stage II: Right upper face--scabbed  Last BM:  2/4 type 4  Height:   Ht Readings from Last 1 Encounters:  03/21/18 _0  (1.651 m) (61 %, Z= 0.27)*   * Growth percentiles are based on CDC (Girls, 2-20 Years) data.    Weight:   Wt Readings from Last 1 Encounters:  04/10/18 47 kg (6 %, Z= -1.52)*   * Growth percentiles are based on CDC (Girls, 2-20 Years) data.    Ideal Body Weight:  56.82 kg  BMI:  Body mass index is 17.24 kg/m.  Estimated Nutritional Needs:   Kcal:  1600-1800  Protein:  65-75 gm  Fluid:  >/= 1.6 L    Molli Barrows, RD, LDN, [place] Pager [phone] After Hours Pager [phone]

## 2018-04-10 NOTE — Progress Notes (Signed)
4 PM pt displaying recurring phyiscal symptoms described this morning of muscle tremors, rigor w/ ability to follow commands and respond w/ nods and gestures. Tachypniec, tachycardic waxes and wanes (see vs for 1600 to 1730). Precedex titrated back to 2.0 initially. Pt responding to verbal comfort and s/s improved transiently. Over 1615 to 1700 s/s continue to appear resolving and then escalate again. Dr. Isaiah Serge updated and continue monitoring closely w/ Boyd Kerbs RN, Burnett Harry RN, Erie Noe RN, sitter and myself in and out of room constantly. Interventions escalated from titrated precedex to haldol to one time order for versed 2mg . Pt w/ return to resting vs and stable assessment by 1700. RT called to bs and updated to interventions, assessments for further evaluation of appropriateness of continued TC. Pt w/ stable respiratory status and RT to continue assisting in assessment. Dr. Isaiah Serge at bs to discuss care with parents at 38. Emotional support given to pt and family.

## 2018-04-10 NOTE — Progress Notes (Signed)
NAME:  Lindsay Lara, MRN:  [id number], DOB:  October 05, 2000, LOS: 20 ADMISSION DATE:  03/21/2018, CONSULTATION DATE:  03/21/2018 REFERRING MD:  Dr. [name] CarlScheider CHIEF COMPLAINT:  Altered Mental Status    Brief History   18 yr old female w/ PMHx ADHD, Anxiety, Bipolar depression,previous suicide attempts,Polysubstance abuse (cocaine, Marijuana,heroin and ETOH) presentsvia EMS after Mom found pt unresponsive. Bradycardic to 40sandhypotensive with SBP in the 40s. Intubated.Given Glucagon.Started on epi gtt. PCCM consulted for Intentional OD (propanolol, xanax and Seroquel)  Significant Hospital Events   1/16>Admission  1/16>Seen by Neurology - MRI for possible lateralizing signs 04/10/1998 20   24 hrs. of trach collar  Consults:  PCCM Neurology  Psychiatry Rehabilitation medicine  Procedures:  1/16> Intubated 1/16>CVL placed  1/17> chest tube placed 1/18>Bedside bronchoscopy , Therapeutic suctioning  1/19>extubated>reintubated 1/24>extubated 1/24>reintubated  1/29> extubated  1/31> Trach  Significant Diagnostic Tests:  CT head: Unremarkable  EKG: RBBB, Prolonged QTc 528 EEG: possible right focal cerebral dysfunction. No clear seizure activity. MRI: Negative for acute infarct or mass, Question diffuse cortical edema right hemisphere on axial T2 images.While this could be due to artifact, consider seizure related cortical edema. EEG 1/23: no seizure activity   Micro Data:  1/18 Urine Cx> E. coli 1/18 Tracheal Aspirate Cx> Rare GNR, pending sensitivity/cx  1/21 Repeat Resp culture > rare candida  1/24 Sputum > normal resp flora  1/25 Blood > NGTD  Antimicrobials:  Zosyn 1/18 >1/19 Vancomycin 1/18 >1/19, 1/26>1/27 Unasyn 1/19>1/26 Ampicillin 1/21> 1/22 Cefepime 1/26 >1/31  Interim history/subjective:  No further pseudoseizures Trach collar x24 hours Chest x-ray with decreased lung volumes most likely secondary to being off positive pressure ventilation reviewed on  04/10/2018 Objective   Blood pressure 107/61, pulse 72, temperature 97.8 F (36.6 C), temperature source Axillary, resp. rate 16, height 5\' 5"  (1.651 m), weight 47 kg, last menstrual period 03/21/2018, SpO2 100 %.    FiO2 (%):  [28 %-35 %] 28 %   Intake/Output Summary (Last 24 hours) at 04/10/2018 [id number] Last data filed at 04/10/2018 0600 Gross per 24 hour  Intake 1431.04 ml  Output -  Net 1431.04 ml   Filed Weights   04/08/18 0500 04/09/18 0500 04/10/18 0443  Weight: 47.6 kg 48.1 kg 47 kg   Physical Exam: General: Frail thin female currently sleeping HEENT  Tracheostomy unremarkable  Neuro: Currently sleeping CV: s1s2 rrr, no m/r/g PULM: even/non-labored, lungs bilaterally diminished JX:BJYNGI:soft, non-tender, bsx4 active  Extremities: warm/dry, negative edema  Skin: no rashes or lesions    Resolved Hospital Problem list   Left apical Pneumothorax   Acute  respiratory failure secondary to respiratory depression from intentional overdose  Assessment & Plan:  18 yr old female w/ PMHx ADHD, Anxiety, Bipolar depression,previous suicide attempts,Polysubstance abuse (cocaine, Marijuana,heroin and ETOH) presentsfollowing intentional drug overdose with Propranolol and Seroquel. Utox positive for benzos and THC.   ARDS/Aspiration pneumonia s/p tracheostomy 1/31 Currently is a 24 hours on trach collar PRN ventilatory support.  Remove ventilator after 48 hours Status post swallowing evaluation 04/09/2018 no overt aspiration  Acute encephalopathy secondary to delirium and mental health diagnosis as noted below Suspect she may be withdrawing from opiates.  EEG with diffuse changes consistent with metabolic encephalopathy..  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  She can be started on regular diet with thin liquids  Best practice:  Diet:Tube feedings DVT prophylaxis:Lovenox GI prophylaxis:none Mobility: OOB as tolerated, referred to IP rehab Code Status:FULL Family Communication:  04/10/2018 mother updated at bedside  Labs   CBC: Recent Labs  Lab 04/06/18 0422 04/07/18 0519 04/08/18 0443 04/09/18 0500 04/10/18 0442  WBC 11.0* 9.3 8.2 7.0 8.8  NEUTROABS  --  4.3 3.4  --  4.0  HGB 8.7* 9.2* 10.6* 9.2* 8.7*  HCT 27.5* 29.2* 34.0* 30.7* 28.2*  MCV 92.6 91.3 91.2 93.9 92.8  PLT 618* 602* 700* 554* 548*    Basic Metabolic Panel: Recent Labs  Lab 04/04/18 0500  04/06/18 0422 04/07/18 0519 04/08/18 0443 04/09/18 0500 04/10/18 0442  NA 139   < > 138 136 139 138 140  K 4.0   < > 3.9 4.1 3.8 4.4 3.8  CL 99   < > 105 102 104 103 107  CO2 29   < > 24 26 24 24 28   GLUCOSE 83   < > 111* 87 93 113* 117*  BUN 14   < > 22* 13 13 15 10   CREATININE 0.68   < > 0.59 0.43* 0.60 0.62 0.64  CALCIUM 8.5*   < > 8.2* 8.5* 9.2 8.8* 8.9  MG 2.2  --  2.0  --   --  1.9 2.0  PHOS  --   --   --   --   --  4.6 4.9*   < > = values in this interval not displayed.   GFR: Estimated Creatinine Clearance: 83.9 mL/min (by C-G formula based on SCr of 0.64 mg/dL). Recent Labs  Lab 04/07/18 0519 04/08/18 0443 04/09/18 0500 04/10/18 0442  WBC 9.3 8.2 7.0 8.8    Liver Function Tests: Recent Labs  Lab 04/06/18 0422 04/07/18 0519 04/08/18 0443  AST 48* 41 31  ALT 46* 50* 47*  ALKPHOS 104 105 108  BILITOT 0.2* 0.5 0.5  PROT 5.6* 5.7* 6.3*  ALBUMIN 2.4* 2.4* 2.8*   No results for input(s): LIPASE,  AMYLASE in the last 168 hours. No results for input(s): AMMONIA in the last 168 hours.  ABG    Component Value Date/Time   PHART 7.584 (H) 04/05/2018 1220   PCO2ART 28.2 (L) 04/05/2018 1220   PO2ART 130.0 (H) 04/05/2018 1220   HCO3 26.7 04/05/2018 1220   TCO2 28 04/05/2018 1220   ACIDBASEDEF 1.0 03/28/2018 0420   O2SAT 99.0 04/05/2018 1220     Coagulation Profile: Recent Labs  Lab 04/05/18 0513  INR 1.13    Cardiac Enzymes: No results for input(s): CKTOTAL, CKMB, CKMBINDEX, TROPONINI in the last 168 hours.  HbA1C: Hgb A1c MFr Bld  Date/Time Value Ref Range Status  02/10/2018 06:55 AM 5.0 4.8 - 5.6 % Final    Comment:    (NOTE) Pre diabetes:          5.7%-6.4% Diabetes:              >6.4% Glycemic control for   <7.0% adults with diabetes   09/17/2015 06:42 PM 5.2 4.8 - 5.6 % Final    Comment:    (NOTE)         Pre-diabetes: 5.7 - 6.4  Diabetes: >6.4         Glycemic control for adults with diabetes: <7.0     CBG: Recent Labs  Lab 04/08/18 2323 04/09/18 1122 04/09/18 1836 04/09/18 2348 04/10/18 0630  GLUCAP 109* 108* 87 115* 112*    App cct 30 min  Brett Canales Minor ACNP [facility] PCCM Pager [phone] till 1 pm If no answer page [phone] 04/10/2018, 8:22 AM

## 2018-04-10 NOTE — Clinical Social Work Note (Signed)
Clinical Social Work Assessment  Patient Details  Name: Lindsay Lara MRN: [id number] Date of Birth: 03/07/2000  Date of referral:  04/10/18               Reason for consult:  Suicide Risk/Attempt, Substance Use/ETOH Abuse, Discharge Planning                Permission sought to share information with:  Family Supports Permission granted to share information::     Name::     Kayren Eavesmanda Palermo  Agency::  family  Relationship::  mother   Contact Information:  Greg Cuttermanda Jubb [phone]  Housing/Transportation Living arrangements for the past 2 months:  Single Family Home(with dad) Source of Information:  Parent(mother) Patient Interpreter Needed:  None Criminal Activity/Legal Involvement Pertinent to Current Situation/Hospitalization:  No - Comment as needed Significant Relationships:  Mental Health Provider, Other Family Members, Siblings, Parents Lives with:  Parents(dad only at this time.) Do you feel safe going back to the place where you live?  No Need for family participation in patient care:  Yes (Comment)  Care giving concerns:  CSW following pt as pt had intentional overdose recently. CSW aware that psych has seen and assessed pt and think that pt would be better services at an inpt psych facility once pt has been medically cleared. CSW went to speak with pt and family at bedside regarding this recommendation at the time of discharge.    Social Worker assessment / plan:  CSW aware that per psych recommendation pt would benefit from inpt pyshc placement at the time of discharge. CSW wen to speak with pt and family at bedside to gather more information on pt. CSW was unable to speak with pt as pt was asleep and on trach collar. CSW spoke with pt's mother Marchelle Folksmanda at bedside as no other family was present. CSW was informed by mother that pt has been living with dad for some time. Mother expressed that pt once lived with her but once pt found out that mother didn't condone drug use-pt went to  live with dad. Per report of mother [facility] (last admission being November 2019 from mothers report). Mother expressed that on that admission pt was using substance and [facility].   Mother expressed that she and pt's dad are not living together but pt has support from both pf them as well as other relatives. Pt's mother also expressed that pt enjoys and loved [facility](Kelly Norris). Mother suggested that if pt is recommended for further psych treatment pt may be hesitant to go as pt expressed desire to go home once discharged from the hospital. CSW advised mother that in situations such as this one, most of the time pt's are recommended to get further treatment at a psych hospital to ensure that pt is safe and able to return home when ready. Mother reported understanding this and was agreeable to pt going to a facility for further mental health needs.    During this assessment pt was asleep in bed with hari braided. Mother sat in recliner with lap top. Mother clinched lap top as CSW spoke with her about net steps in care. Mother expresses that pt will be coming to live with her for good when pt is able to return home.   Employment status:  Unemployed Health and safety inspectornsurance information:  Other (Comment Required) PT Recommendations:  Not assessed  at this time Information / Referral to community resources:  Inpatient Psychiatric Care (Comment Required)  Patient/Family's Response to care:  Mother response to care was very understanding and only wanting to do what is best for pt. CSW expressed that CSW would follow up as schedule allows to update pt on recommendations in care.   Patient/Family's Understanding of and Emotional Response to Diagnosis, Current Treatment, and Prognosis:  At this time no further questions or concerns have been presented to CSW. CSW will follow for psych placement once pt has been medically  cleared.   Emotional Assessment Appearance:  Appears stated age Attitude/Demeanor/Rapport:  Unable to Assess Affect (typically observed):  Unable to Assess Orientation:  (pt on trach collar adn asleep. unable to assess. ) Alcohol / Substance use:  Illicit Drugs Psych involvement (Current and /or in the community):  Yes (Comment), Outpatient Provider(suggested inpt treatment. )  Discharge Needs  Concerns to be addressed:  Patient refuses services, Care Coordination, Home Safety Concerns, Substance Abuse Concerns Readmission within the last 30 days:  No Current discharge risk:  Dependent with Mobility, Substance Abuse Barriers to Discharge:  Continued Medical Work up   [employer], LCSWA 04/10/2018, 8:20 AM

## 2018-04-10 NOTE — Evaluation (Signed)
Physical Therapy Evaluation Patient Details Name: Lindsay Lara MRN: [id number] DOB: 06/08/2000 Today's Date: 04/10/2018   History of Present Illness  Pt is a 18 yr old female w/ PMHx ADHD, Anxiety, Bipolar depression, previous suicide attempts, Polysubstance abuse (cocaine, Marijuana, heroin and ETOH) presents via EMS after Mom found pt unresponsive. Bradycardic and hypotensive. Intubated 1/16-1/20, reintubated several hours later 1/20- 1/24 (self extubated), reintubated 1/24-1/29. Trach placed on 1/31.    Clinical Impression  Pt presented supine in bed with HOB elevated, awake and willing to participate in therapy session. Pt's mother present throughout session as well. Prior to admission, pt was independent with all functional mobility and ADLs. Pt currently requires min guard for bed mobility, min guard for transfers and min-mod A x2 to ambulate without use of an AD. Pt on 28% trach collar throughout with SPO2 maintaining >90% and HR elevating from low 120's to as high as 140 bpm. Pt's HR quickly recovering back to 120's with sitting rest break. Pt's RN was notified.   Pt is an excellent candidate for CIR to maximize her independence with functional mobility prior to returning home with family or going to inpatient psych. PT will continue to follow acutely to progress mobility as tolerated.     Follow Up Recommendations CIR    Equipment Recommendations  None recommended by PT    Recommendations for Other Services       Precautions / Restrictions Precautions Precautions: Fall Precaution Comments: suicide, NG tube, trach Restrictions Weight Bearing Restrictions: No      Mobility  Bed Mobility Overal bed mobility: Needs Assistance Bed Mobility: Supine to Sit     Supine to sit: Min guard     General bed mobility comments: min guard for safety, HOB elevated  Transfers Overall transfer level: Needs assistance Equipment used: None Transfers: Sit to/from Stand Sit to Stand:  Min guard         General transfer comment: min guard for safety  Ambulation/Gait Ambulation/Gait assistance: Min assist;Mod assist;+2 safety/equipment Gait Distance (Feet): 100 Feet Assistive device: None;1 person hand held assist Gait Pattern/deviations: Step-through pattern;Decreased step length - right;Decreased step length - left;Decreased stride length;Drifts right/left;Staggering right;Staggering left;Ataxic Gait velocity: decreased   General Gait Details: pt with modest instability requiring constant min A with occasional mod A to maintain upright balance (specifically with head turns and when distracted)  Stairs            Wheelchair Mobility    Modified Rankin (Stroke Patients Only)       Balance Overall balance assessment: Needs assistance Sitting-balance support: No upper extremity supported;Feet supported Sitting balance-Leahy Scale: Fair     Standing balance support: During functional activity;No upper extremity supported;Single extremity supported Standing balance-Leahy Scale: Poor Standing balance comment: Reilant on physical A                             Pertinent Vitals/Pain Pain Assessment: No/denies pain    Home Living Family/patient expects to be discharged to:: Private residence Living Arrangements: Parent Available Help at Discharge: Family;Available 24 hours/day Type of Home: House Home Access: Stairs to enter Entrance Stairs-Rails: None Entrance Stairs-Number of Steps: 1-1 Home Layout: Two level;Bed/bath upstairs Home Equipment: None      Prior Function Level of Independence: Independent         Comments: ADLs, IADLs (does not drive due to DUI), has a dog, 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  Hand Dominance   Dominant Hand: Right    Extremity/Trunk Assessment   Upper Extremity Assessment Upper Extremity Assessment: Defer to OT evaluation    Lower Extremity Assessment Lower Extremity  Assessment: Overall WFL for tasks assessed       Communication   Communication: Tracheostomy  Cognition Arousal/Alertness: Awake/alert Behavior During Therapy: WFL for tasks assessed/performed Overall Cognitive Status: Difficult to assess                                 General Comments: Pt following simple commands and able to answer yes/no questions appropriately      General Comments      Exercises     Assessment/Plan    PT Assessment Patient needs continued PT services  PT Problem List Decreased balance;Decreased mobility;Decreased coordination;Decreased safety awareness;Decreased knowledge of precautions       PT Treatment Interventions DME instruction;Gait training;Stair training;Functional mobility training;Therapeutic activities;Therapeutic exercise;Balance training;Neuromuscular re-education;Patient/family education    PT Goals (Current goals can be found in the Care Plan section)  Acute Rehab PT Goals Patient Stated Goal: pt indicated that she wanted to walk again PT Goal Formulation: With patient/family Time For Goal Achievement: 04/24/18 Potential to Achieve Goals: Good    Frequency Min 3X/week   Barriers to discharge        Co-evaluation PT/OT/SLP Co-Evaluation/Treatment: Yes Reason for Co-Treatment: For patient/therapist safety;To address functional/ADL transfers PT goals addressed during session: Mobility/safety with mobility;Balance;Strengthening/ROM         AM-PAC PT "6 Clicks" Mobility  Outcome Measure Help needed turning from your back to your side while in a flat bed without using bedrails?: None Help needed moving from lying on your back to sitting on the side of a flat bed without using bedrails?: None Help needed moving to and from a bed to a chair (including a wheelchair)?: A Little Help needed standing up from a chair using your arms (e.g., wheelchair or bedside chair)?: A Little Help needed to walk in hospital room?: A  Lot Help needed climbing 3-5 steps with a railing? : A Lot 6 Click Score: 18    End of Session Equipment Utilized During Treatment: Gait belt;Oxygen(trach collar 28%) Activity Tolerance: Patient tolerated treatment well Patient left: in chair;with call bell/phone within reach;with nursing/sitter in room;with family/visitor present Nurse Communication: Mobility status PT Visit Diagnosis: Other abnormalities of gait and mobility (R26.89)    Time: 4401-0272 PT Time Calculation (min) (ACUTE ONLY): 23 min   Charges:   PT Evaluation $PT Eval Moderate Complexity: 1 Mod          Deborah Chalk, PT, DPT  Acute Rehabilitation Services Pager [phone] Office [phone]    Alessandra Bevels Rube Sanchez 04/10/2018, 3:42 PM

## 2018-04-10 NOTE — Progress Notes (Signed)
OT Cancellation Note  Patient Details Name: KAASHVI PANZICA MRN: [id number] DOB: 04-22-2000   Cancelled Treatment:    Reason Eval/Treat Not Completed: Patient's level of consciousness(Pt on Precedex. Rn request attempt again this afternoon. Will return as schedule allows. Thank you.)  Musa Rewerts M Aaryana Betke Daveda Larock MSOT, OTR/L Acute Rehab Pager: [phone] Office: [phone] 04/10/2018, 7:32 AM

## 2018-04-11 LAB — GLUCOSE, CAPILLARY
Glucose-Capillary: 96 mg/dL (ref 70–99)
Glucose-Capillary: 84 mg/dL (ref 70–99)
Glucose-Capillary: 95 mg/dL (ref 70–99)

## 2018-04-11 MED ORDER — SERTRALINE HCL 50 MG PO TABS
50.0000 mg | ORAL_TABLET | Freq: Every day | ORAL | Status: DC
  Administered 2018-04-12 – 2018-04-17 (×6): 50 mg via ORAL
  Filled 2018-04-11 (×7): qty 1

## 2018-04-11 MED ORDER — QUETIAPINE FUMARATE 300 MG PO TABS
300.0000 mg | ORAL_TABLET | Freq: Two times a day (BID) | ORAL | Status: DC
  Administered 2018-04-11 – 2018-04-17 (×12): 300 mg via ORAL
  Filled 2018-04-11 (×13): qty 1

## 2018-04-11 MED ORDER — VITAMIN B-1 100 MG PO TABS
100.0000 mg | ORAL_TABLET | Freq: Every day | ORAL | Status: DC
  Administered 2018-04-12 – 2018-04-17 (×6): 100 mg via ORAL
  Filled 2018-04-11 (×7): qty 1

## 2018-04-11 MED ORDER — OXYCODONE HCL 5 MG PO TABS
20.0000 mg | ORAL_TABLET | Freq: Four times a day (QID) | ORAL | Status: DC
  Administered 2018-04-11 – 2018-04-13 (×10): 20 mg via ORAL
  Filled 2018-04-11 (×11): qty 4

## 2018-04-11 MED ORDER — FOLIC ACID 1 MG PO TABS
1.0000 mg | ORAL_TABLET | Freq: Every day | ORAL | Status: DC
  Administered 2018-04-12 – 2018-04-17 (×6): 1 mg via ORAL
  Filled 2018-04-11 (×7): qty 1

## 2018-04-11 MED ORDER — HYDROXYZINE HCL 25 MG PO TABS
25.0000 mg | ORAL_TABLET | Freq: Three times a day (TID) | ORAL | Status: DC
  Administered 2018-04-11 – 2018-04-17 (×19): 25 mg via ORAL
  Filled 2018-04-11 (×19): qty 1

## 2018-04-11 NOTE — Progress Notes (Signed)
NAME:  Lindsay Lara, MRN:  [id number], DOB:  [birth year], LOS: 21 ADMISSION DATE:  03/21/2018, CONSULTATION DATE:  03/21/2018 REFERRING MD:  Dr. [name] CarlScheider CHIEF COMPLAINT:  Altered Mental Status    Brief History   18 yr old female w/ PMHx ADHD, Anxiety, Bipolar depression,previous suicide attempts,Polysubstance abuse (cocaine, Marijuana,heroin and ETOH) presentsvia EMS after Mom found pt unresponsive. Bradycardic to 40sandhypotensive with SBP in the 40s. Intubated.Given Glucagon.Started on epi gtt. PCCM consulted for Intentional OD (propanolol, xanax and Seroquel)  Significant Hospital Events   1/16>Admission  1/16>Seen by Neurology - MRI for possible lateralizing signs 04/11/2018 transferred to progressive care and to [facility]ist service with pulmonary critical care to follow for trach  Consults:  PCCM Neurology  Psychiatry Rehabilitation medicine  Procedures:  1/16> Intubated 1/16>CVL placed discontinued 04/10/2018 1/17> chest tube placed out  1/18>Bedside bronchoscopy , Therapeutic suctioning  1/19>extubated>reintubated 1/24>extubated 1/24>reintubated  1/29> extubated  1/31> Trach  Significant Diagnostic Tests:  CT head: Unremarkable  EKG: RBBB, Prolonged QTc 528 EEG: possible right focal cerebral dysfunction. No clear seizure activity. MRI: Negative for acute infarct or mass, Question diffuse cortical edema right hemisphere on axial T2 images.While this could be due to artifact, consider seizure related cortical edema. EEG 1/23: no seizure activity   Micro Data:  1/18 Urine Cx> E. coli 1/18 Tracheal Aspirate Cx> Rare GNR, pending sensitivity/cx  1/21 Repeat Resp culture > rare candida  1/24 Sputum > normal resp flora  1/25 Blood > NGTD  Antimicrobials:  Zosyn 1/18 >1/19 Vancomycin 1/18 >1/19, 1/26>1/27 Unasyn 1/19>1/26 Ampicillin 1/21> 1/22 Cefepime 1/26 >1/31  Interim history/subjective:  48 hours off mechanical ventilatory support Off  Precedex Improved we will transfer to progressive care Objective   Blood pressure (!) 154/87, pulse (!) 109, temperature 98.7 F (37.1 C), temperature source Oral, resp. rate 18, height 5\' 5"  (1.651 m), weight 46.4 kg, last menstrual period 03/21/2018, SpO2 100 %.    FiO2 (%):  [21 %-28 %] 21 %   Intake/Output Summary (Last 24 hours) at 04/11/2018 0932 Last data filed at 04/11/2018 0900 Gross per 24 hour  Intake 1518.15 ml  Output 900 ml  Net 618.15 ml   Filed Weights   04/09/18 0500 04/10/18 0443 04/11/18 0500  Weight: 48.1 kg 47 kg 46.4 kg   Physical Exam: General: Thin frail female who is awake and pleasant, off Precedex HEENT: Tracheostomy in place cuff is down Neuro: More awake more interactive less agitated CV: s1s2 rrr, no m/r/g PULM: even/non-labored, lungs bilaterally initiate WU:JWJXGI:soft, non-tender, bsx4 active tolerating regular diet Extremities: warm/dry, negative edema  Skin: no rashes or lesions     Resolved Hospital Problem list   Left apical Pneumothorax   Acute  respiratory failure secondary to respiratory depression from intentional overdose  Assessment & Plan:  18 yr old female w/ PMHx ADHD, Anxiety, Bipolar depression,previous suicide attempts,Polysubstance abuse (cocaine, Marijuana,heroin and ETOH) presentsfollowing intentional drug overdose with Propranolol and Seroquel. Utox positive for benzos and THC.   ARDS/Aspiration pneumonia s/p tracheostomy 1/31 48 hours on trach collar Remove ventilator from room Transfer to progressive care Suspect she can be decannulated in the next 24 to 48 hours  Acute encephalopathy secondary to delirium and mental health diagnosis as noted below Suspect she may be withdrawing from opiates.  EEG with diffuse changes consistent with metabolic encephalopathy..  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 She is off IV narcotics Is off Precedex Improving Transfer to progressive care   Suicide Attempt  Hx prior SI and attempts Hx  polysubstance  abuse ADHD Bipolar and anxiety disorder 04/11/2018 await psychiatric consult, most likely she will need inpatient psychiatric care due to her significant self-destructive behavior Precedex has been weaned off Continue anxiolytics and psychotropic medication Transfer to progressive care unit and to the care of [facility] Suspect she will need physical rehabilitation    Hx genital herpes Continues acyclovir  Constipation Bowel regiment PRN   feeding tube We will discontinue tube feedings as of 04/11/2018 Regular diet  Best practice:  Diet:Regular DVT prophylaxis:Lovenox GI prophylaxis:none Mobility: OOB as tolerated, referred to IP rehab Code Status:FULL Family Communication:  04/11/2018 mother and patient updated at bedside  Labs   CBC: Recent Labs  Lab 04/06/18 0422 04/07/18 0519 04/08/18 0443 04/09/18 0500 04/10/18 0442  WBC 11.0* 9.3 8.2 7.0 8.8  NEUTROABS  --  4.3 3.4  --  4.0  HGB 8.7* 9.2* 10.6* 9.2* 8.7*  HCT 27.5* 29.2* 34.0* 30.7* 28.2*  MCV 92.6 91.3 91.2 93.9 92.8  PLT 618* 602* 700* 554* 548*    Basic Metabolic Panel: Recent Labs  Lab 04/06/18 0422 04/07/18 0519 04/08/18 0443 04/09/18 0500 04/10/18 0442  NA 138 136 139 138 140  K 3.9 4.1 3.8 4.4 3.8  CL 105 102 104 103 107  CO2 24 26 24 24 28   GLUCOSE 111* 87 93 113* 117*  BUN 22* 13 13 15 10   CREATININE 0.59 0.43* 0.60 0.62 0.64  CALCIUM 8.2* 8.5* 9.2 8.8* 8.9  MG 2.0  --   --  1.9 2.0  PHOS  --   --   --  4.6 4.9*   GFR: Estimated Creatinine Clearance: 82.9 mL/min (by C-G formula based on SCr of 0.64 mg/dL). Recent Labs  Lab 04/07/18 0519 04/08/18 0443 04/09/18 0500 04/10/18 0442  WBC 9.3 8.2 7.0 8.8    Liver Function Tests: Recent Labs  Lab 04/06/18 0422 04/07/18 0519 04/08/18 0443  AST 48* 41 31  ALT 46* 50* 47*  ALKPHOS 104 105 108  BILITOT 0.2* 0.5 0.5  PROT 5.6* 5.7* 6.3*  ALBUMIN 2.4* 2.4* 2.8*   No results for input(s): LIPASE, AMYLASE in  the last 168 hours. No results for input(s): AMMONIA in the last 168 hours.  ABG    Component Value Date/Time   PHART 7.584 (H) 04/05/2018 1220   PCO2ART 28.2 (L) 04/05/2018 1220   PO2ART 130.0 (H) 04/05/2018 1220   HCO3 26.7 04/05/2018 1220   TCO2 28 04/05/2018 1220   ACIDBASEDEF 1.0 03/28/2018 0420   O2SAT 99.0 04/05/2018 1220     Coagulation Profile: Recent Labs  Lab 04/05/18 0513  INR 1.13    Cardiac Enzymes: No results for input(s): CKTOTAL, CKMB, CKMBINDEX, TROPONINI in the last 168 hours.  HbA1C: Hgb A1c MFr Bld  Date/Time Value Ref Range Status  02/10/2018 06:55 AM 5.0 4.8 - 5.6 % Final    Comment:    (NOTE) Pre diabetes:          5.7%-6.4% Diabetes:              >6.4% Glycemic control for   <7.0% adults with diabetes   09/17/2015 06:42 PM 5.2 4.8 - 5.6 % Final    Comment:    (NOTE)         Pre-diabetes: 5.7 - 6.4         Diabetes: >6.4         Glycemic control for adults with diabetes: <7.0     CBG: Recent Labs  Lab 04/09/18 2348 04/10/18 0630 04/10/18  1134 04/10/18 1851 04/10/18 2326  GLUCAP 115* 112* 127* 94 96      Steve Quantavis Obryant ACNP [facility] Pager [phone] till 1 pm If no answer page [phone] 04/11/2018, 9:32 AM

## 2018-04-11 NOTE — Progress Notes (Signed)
Physical Therapy Treatment Patient Details Name: Lindsay Lara MRN: [id number] DOB: Aug 06, 2000 Today's Date: 04/11/2018    History of Present Illness Pt is a 18 yr old female w/ PMHx ADHD, Anxiety, Bipolar depression, previous suicide attempts, Polysubstance abuse (cocaine, Marijuana, heroin and ETOH) presents via EMS after Mom found pt unresponsive. Bradycardic and hypotensive. Intubated 1/16-1/20, reintubated several hours later 1/20- 1/24 (self extubated), reintubated 1/24-1/29. Trach placed on 1/31.    PT Comments    Patient progressing very well with therapy today, ambulating unit on RA with x2 LOB requiring min A to stabilize. Patient scored 16/24 on DGI today. VSS on RA this session.  Updated recs to OP PT for higher level balance and reconditioning.     Follow Up Recommendations  Outpatient PT(may progress to No PT Follow Up)     Equipment Recommendations  None recommended by PT    Recommendations for Other Services       Precautions / Restrictions Precautions Precautions: Fall Precaution Comments: suicide, trach on moisture  Restrictions Weight Bearing Restrictions: No    Mobility  Bed Mobility               General bed mobility comments: pt seated at EOB upon arrival  Transfers Overall transfer level: Needs assistance Equipment used: None Transfers: Sit to/from Stand Sit to Stand: Min guard Stand pivot transfers: Min assist;+2 physical assistance;+2 safety/equipment       General transfer comment: min guard for safety/lines  Ambulation/Gait Ambulation/Gait assistance: Min guard;Min assist Gait Distance (Feet): 120 Feet Assistive device: None;1 person hand held assist Gait Pattern/deviations: Step-to pattern;Step-through pattern Gait velocity: decreased   General Gait Details: pt with mild unsteadiness, DGI 16/24, 2 LOB requring min A to correct.    Stairs             Wheelchair Mobility    Modified Rankin (Stroke Patients Only)        Balance Overall balance assessment: Needs assistance Sitting-balance support: No upper extremity supported;Feet supported Sitting balance-Leahy Scale: Good       Standing balance-Leahy Scale: Fair Standing balance comment: up to min assist for dynamic balance                 Standardized Balance Assessment Standardized Balance Assessment : Dynamic Gait Index   Dynamic Gait Index Level Surface: Mild Impairment Change in Gait Speed: Mild Impairment Gait with Horizontal Head Turns: Mild Impairment Gait with Vertical Head Turns: Mild Impairment Gait and Pivot Turn: Mild Impairment Step Over Obstacle: Mild Impairment Step Around Obstacles: Mild Impairment Steps: Mild Impairment Total Score: 16      Cognition Arousal/Alertness: Awake/alert Behavior During Therapy: WFL for tasks assessed/performed Overall Cognitive Status: Difficult to assess                                 General Comments: appears intact, following commands, responding appropriately to questions, oriented      Exercises      General Comments        Pertinent Vitals/Pain Pain Assessment: No/denies pain    Home Living                      Prior Function            PT Goals (current goals can now be found in the care plan section) Acute Rehab PT Goals Patient Stated Goal: to return home Time For Goal Achievement:  04/24/18 Potential to Achieve Goals: Good Progress towards PT goals: Progressing toward goals    Frequency    Min 3X/week      PT Plan Current plan remains appropriate    Co-evaluation              AM-PAC PT "6 Clicks" Mobility   Outcome Measure  Help needed turning from your back to your side while in a flat bed without using bedrails?: None Help needed moving from lying on your back to sitting on the side of a flat bed without using bedrails?: None Help needed moving to and from a bed to a chair (including a wheelchair)?:  None Help needed standing up from a chair using your arms (e.g., wheelchair or bedside chair)?: None Help needed to walk in hospital room?: A Little Help needed climbing 3-5 steps with a railing? : A Little 6 Click Score: 22    End of Session Equipment Utilized During Treatment: Gait belt Activity Tolerance: Patient tolerated treatment well Patient left: with nursing/sitter in room;with family/visitor present;in chair;with bed alarm set Nurse Communication: Mobility status PT Visit Diagnosis: Other abnormalities of gait and mobility (R26.89)     Time: 3536-1443 PT Time Calculation (min) (ACUTE ONLY): 15 min  Charges:  $Gait Training: 8-22 mins                    Etta Grandchild, PT, DPT Acute Rehabilitation Services Pager: [phone] Office: [phone]     Etta Grandchild 04/11/2018, 3:26 PM

## 2018-04-11 NOTE — Progress Notes (Addendum)
Occupational Therapy Treatment Patient Details Name: Lindsay Lara MRN: [id number] DOB: Nov 07, 2000 Today's Date: 04/11/2018    History of present illness Pt is a 18 yr old female w/ PMHx ADHD, Anxiety, Bipolar depression, previous suicide attempts, Polysubstance abuse (cocaine, Marijuana, heroin and ETOH) presents via EMS after Mom found pt unresponsive. Bradycardic and hypotensive. Intubated 1/16-1/20, reintubated several hours later 1/20- 1/24 (self extubated), reintubated 1/24-1/29. Trach placed on 1/31.   OT comments  Pt is making rapid progress toward goals. Appears to be intact cognitively, but trach interfering with accurate assessment. Pt with mild unsteadiness with ambulation, improved with continued activity. Updated discharge recommendation.  Follow Up Recommendations  No OT follow up(pending progress/cognitive assessment)    Equipment Recommendations  None recommended by OT    Recommendations for Other Services      Precautions / Restrictions Precautions Precautions: Fall Precaution Comments: suicide, trach on moisture        Mobility Bed Mobility               General bed mobility comments: pt seated at EOB upon arrival  Transfers Overall transfer level: Needs assistance Equipment used: None Transfers: Sit to/from Stand Sit to Stand: Min guard         General transfer comment: min guard for safety/lines    Balance Overall balance assessment: Needs assistance   Sitting balance-Leahy Scale: Good       Standing balance-Leahy Scale: Fair Standing balance comment: up to min assist for dynamic balance                           ADL either performed or assessed with clinical judgement   ADL   Eating/Feeding: Independent   Grooming: Min guard;Standing           Upper Body Dressing : Set up;Sitting   Lower Body Dressing: Set up;Sitting/lateral leans               Functional mobility during ADLs: Minimal assistance        Vision       Perception     Praxis      Cognition Arousal/Alertness: Awake/alert Behavior During Therapy: WFL for tasks assessed/performed Overall Cognitive Status: Difficult to assess                                 General Comments: appears intact, following commands, responding appropriately to questions, oriented        Exercises     Shoulder Instructions       General Comments      Pertinent Vitals/ Pain       Pain Assessment: No/denies pain  Home Living                                          Prior Functioning/Environment              Frequency  Min 2X/week        Progress Toward Goals  OT Goals(current goals can now be found in the care plan section)  Progress towards OT goals: Progressing toward goals  Acute Rehab OT Goals Patient Stated Goal: to return home OT Goal Formulation: With patient/family Time For Goal Achievement: 04/21/18 Potential to Achieve Goals: Good  Plan Discharge plan needs to be updated  Co-evaluation                 AM-PAC OT "6 Clicks" Daily Activity     Outcome Measure   Help from another person eating meals?: None Help from another person taking care of personal grooming?: A Little Help from another person toileting, which includes using toliet, bedpan, or urinal?: A Little Help from another person bathing (including washing, rinsing, drying)?: A Little Help from another person to put on and taking off regular upper body clothing?: None Help from another person to put on and taking off regular lower body clothing?: A Little 6 Click Score: 20    End of Session Equipment Utilized During Treatment: Gait belt  OT Visit Diagnosis: Unsteadiness on feet (R26.81);Other abnormalities of gait and mobility (R26.89);Muscle weakness (generalized) (M62.81)   Activity Tolerance Patient tolerated treatment well   Patient Left in bed;with call bell/phone within reach;with  family/visitor present   Nurse Communication          Time: 1415-1440 OT Time Calculation (min): 30 min  Charges: OT General Charges $OT Visit: 1 Visit OT Treatments $Therapeutic Activity: 8-22 mins  Martie Round, OTR/L Acute Rehabilitation Services Pager: [phone] Office: [phone]   Evern Bio 04/11/2018, 2:50 PM

## 2018-04-12 DIAGNOSIS — F1320 Sedative, hypnotic or anxiolytic dependence, uncomplicated: Secondary | ICD-10-CM

## 2018-04-12 DIAGNOSIS — F1994 Other psychoactive substance use, unspecified with psychoactive substance-induced mood disorder: Secondary | ICD-10-CM

## 2018-04-12 DIAGNOSIS — F314 Bipolar disorder, current episode depressed, severe, without psychotic features: Secondary | ICD-10-CM

## 2018-04-12 DIAGNOSIS — F1910 Other psychoactive substance abuse, uncomplicated: Secondary | ICD-10-CM

## 2018-04-12 DIAGNOSIS — Z930 Tracheostomy status: Secondary | ICD-10-CM

## 2018-04-12 LAB — GLUCOSE, CAPILLARY: Glucose-Capillary: 86 mg/dL (ref 70–99)

## 2018-04-12 MED ORDER — METOPROLOL TARTRATE 5 MG/5ML IV SOLN
5.0000 mg | INTRAVENOUS | Status: DC | PRN
  Administered 2018-04-13 – 2018-04-14 (×2): 5 mg via INTRAVENOUS
  Filled 2018-04-12 (×2): qty 5

## 2018-04-12 MED ORDER — MIDAZOLAM HCL 2 MG/2ML IJ SOLN
2.0000 mg | Freq: Once | INTRAMUSCULAR | Status: AC
  Administered 2018-04-12: 2 mg via INTRAVENOUS

## 2018-04-12 NOTE — Progress Notes (Signed)
Physical Therapy Treatment Patient Details Name: Lindsay Lara MRN: [id number] DOB: 06/21/2000 Today's Date: 04/12/2018    History of Present Illness Pt is a 18 yr old female w/ PMHx ADHD, Anxiety, Bipolar depression, previous suicide attempts, Polysubstance abuse (cocaine, Marijuana, heroin and ETOH) presents via EMS after Mom found pt unresponsive. Bradycardic and hypotensive. Intubated 1/16-1/20, reintubated several hours later 1/20- 1/24 (self extubated), reintubated 1/24-1/29. Trach placed on 1/31.    PT Comments    Patient progressing very well, dynamic balance and tolerance to activity is improving. HR 130 while walking 400' feet, SpO2 WNL on RA, performed stairs today. Likely progress to no PT follow up at current pace.   Follow Up Recommendations  Outpatient PT(OP pt vs no PT follow up)     Equipment Recommendations  None recommended by PT    Recommendations for Other Services       Precautions / Restrictions Precautions Precautions: Fall Restrictions Weight Bearing Restrictions: No    Mobility  Bed Mobility               General bed mobility comments: pt seated at EOB upon arrival  Transfers Overall transfer level: Needs assistance Equipment used: None Transfers: Sit to/from Stand Sit to Stand: Min guard Stand pivot transfers: Min assist;+2 physical assistance;+2 safety/equipment       General transfer comment: min guard for safety/lines  Ambulation/Gait Ambulation/Gait assistance: Min guard;Supervision Gait Distance (Feet): 450 Feet Assistive device: None;1 person hand held assist Gait Pattern/deviations: Step-to pattern;Step-through pattern Gait velocity: decreased   General Gait Details: increased distance, HR 135, SpO2 WNL    Stairs Stairs: Yes Stairs assistance: Supervision Stair Management: No rails;Alternating pattern Number of Stairs: 12 General stair comments: full flight intermittent use of UE rail, alternating  pattern   Wheelchair Mobility    Modified Rankin (Stroke Patients Only)       Balance Overall balance assessment: Needs assistance Sitting-balance support: No upper extremity supported;Feet supported Sitting balance-Leahy Scale: Good       Standing balance-Leahy Scale: Fair                              Cognition Arousal/Alertness: Awake/alert Behavior During Therapy: WFL for tasks assessed/performed Overall Cognitive Status: Difficult to assess                                 General Comments: appears intact, following commands, responding appropriately to questions, oriented      Exercises      General Comments        Pertinent Vitals/Pain Pain Assessment: No/denies pain    Home Living                      Prior Function            PT Goals (current goals can now be found in the care plan section) Acute Rehab PT Goals Patient Stated Goal: to return home PT Goal Formulation: With patient/family Time For Goal Achievement: 04/24/18 Potential to Achieve Goals: Good Progress towards PT goals: Progressing toward goals    Frequency    Min 3X/week      PT Plan Current plan remains appropriate    Co-evaluation              AM-PAC PT "6 Clicks" Mobility   Outcome Measure  Help needed turning from your back  to your side while in a flat bed without using bedrails?: None Help needed moving from lying on your back to sitting on the side of a flat bed without using bedrails?: None Help needed moving to and from a bed to a chair (including a wheelchair)?: None Help needed standing up from a chair using your arms (e.g., wheelchair or bedside chair)?: None Help needed to walk in hospital room?: None Help needed climbing 3-5 steps with a railing? : A Little 6 Click Score: 23    End of Session Equipment Utilized During Treatment: Gait belt Activity Tolerance: Patient tolerated treatment well Patient left: with  nursing/sitter in room;with family/visitor present;in chair;with bed alarm set Nurse Communication: Mobility status PT Visit Diagnosis: Other abnormalities of gait and mobility (R26.89)     Time: 7741-2878 PT Time Calculation (min) (ACUTE ONLY): 24 min  Charges:  $Gait Training: 23-37 mins                     Etta Grandchild, PT, DPT Acute Rehabilitation Services Pager: [phone] Office: [phone]     Etta Grandchild 04/12/2018, 11:44 AM

## 2018-04-12 NOTE — Progress Notes (Signed)
Inpatient Rehabilitation Admissions Coordinator  I met with patient , her Dad and her safety sitter at bedside. I explained that she is no longer in need of an inpt rehab stay. Has progressed well with PT and OT. I have updated RN CM, Samantha. We will sign off at this time.  Danne Baxter, RN, MSN Rehab Admissions Coordinator [phone] 04/12/2018 11:54 AM

## 2018-04-12 NOTE — Progress Notes (Signed)
[facility] PROGRESS NOTE  Lindsay Lara JJO:[id number] DOB: 04-24-2000 DOA: 03/21/2018 PCP: Lindsay Shackleton, NP-C  Assessment/Plan: 1. Suicide attempt by intentional overdose.  Family reported decline in her mood in the setting of substance abuse.  Psychiatry recommends inpatient psychiatric hospitalization due to serious suicide attempt.   2. Acute hypoxic respiratory failure.  No longer requiring supplemental oxygen.  Tracheostomy in place.  Plan to be downsized on 2/7 and eventually decannulated per critical care. 3. Seizure-like activity, resolved.  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  Neurology recommended repeat MRI brain when she is extubated and stable and to reconsult upon results 4. Bipolar/depression/anxiety, stable. Normal mood on exam. Seroquel 300 mg twice daily for mood stabilization and Zoloft milligrams daily for depression 5. Bradycardia.  In setting of intentional overdose of propranolol.  Resolved. 6. Acute encephalopathy, resolved.  In setting of delirium and active psychiatric illness due to above.  Previously required Precedex while intubated.  Normal mentation currently.  7. ARDS/aspiration pneumonia.  Completed 5-day course of cefepime for aspiration pneumonia.  Tracheostomy placed on 1/31.  On room air.  Critical care plans to downsize trach and eventually decannulated  Code Status: Full code Family Communication: Father updated at bedside Disposition Plan: Has been progressing with physical therapy, no longer requires CIR, once medically stable will need to transition to inpatient psychiatric hospital stay, needs repeat MRI Brain per Neurology, Pulm plans to down-size trach   Consultants:  Neurology, Critical Care, Psychiatry,   Procedures:  Intubated : 1/16- 1/19, 1/19-1/24, 1/24-1/29. Extubated 1/29. Tracheostomy placed  1/31  Antibiotics:  Valacyclovir 2/6-  Cefepime 1/27-1/31  Acyclovir 1/17-1/28, 1/30-2/2, 2/4-2/5.    Unasyn 1/19-1/21, 1/23-4/26  Ampicillin 1/21-1/22  Zosyn 1/18  Vancomycin 1/18, 1/26-1/27  HPI/Subjective:  Lindsay Lara is a 18 y.o. year old female with medical history significant for anxiety, bipolar, depression, previous suicide attempts, polysubstance abuse (cocaine, marijuana, heroin and alcohol) who presented on 03/21/2018 being found unresponsive by her mother and found to have attempted suicide with intentional overdose with Seroquel, propranolol and Xanax.  Hospital course complicated by multiple intubations/extubations (, hypertension brief requiring pressors, left pneumothorax with chest tube placement, ARDS and aspiration pneumonia with mucous plugging.  Most recently tracheostomy was placed on 1/31. This a.m. has no acute complaints.  Does report some chest wall tenderness that is been ongoing for several days.  She like to speak with psychiatry given she hopes that she would not do behavioral health facility stay.  Dad at bedside  Objective: Vitals:   04/12/18 1112 04/12/18 1200  BP:    Pulse: (!) 115 (!) 119  Resp: (!) 21 20  Temp:    SpO2: 98% 97%    Intake/Output Summary (Last 24 hours) at 04/12/2018 1304 Last data filed at 04/11/2018 1834 Gross per 24 hour  Intake 240 ml  Output 0 ml  Net 240 ml   Filed Weights   04/09/18 0500 04/10/18 0443 04/11/18 0500  Weight: 48.1 kg 47 kg 46.4 kg    Exam:   Constitutional: Young female in no acute distress Eyes: EOMI, anicteric, normal conjunctivae Neck: Tracheostomy tube in place Cardiovascular: RRR no MRGs, with no peripheral edema Respiratory: Normal respiratory effort on room air, clear breath sounds  Abdomen: Soft,non-tender, MSK: Chest wall tenderness to palpation Skin: No rash ulcers, or lesions. Without skin tenting  Neurologic: Grossly no focal neuro deficit. Psychiatric:Appropriate affect, and  mood. Mental status  AAOx3  Data Reviewed: Basic Metabolic Panel: Recent Labs  Lab 04/06/18 0422 04/07/18 0519 04/08/18 0443 04/09/18 0500 04/10/18 0442  NA 138 136 139 138 140  K 3.9 4.1 3.8 4.4 3.8  CL 105 102 104 103 107  CO2 24 26 24 24 28   GLUCOSE 111* 87 93 113* 117*  BUN 22* 13 13 15 10   CREATININE 0.59 0.43* 0.60 0.62 0.64  CALCIUM 8.2* 8.5* 9.2 8.8* 8.9  MG 2.0  --   --  1.9 2.0  PHOS  --   --   --  4.6 4.9*   Liver Function Tests: Recent Labs  Lab 04/06/18 0422 04/07/18 0519 04/08/18 0443  AST 48* 41 31  ALT 46* 50* 47*  ALKPHOS 104 105 108  BILITOT 0.2* 0.5 0.5  PROT 5.6* 5.7* 6.3*  ALBUMIN 2.4* 2.4* 2.8*   No results for input(s): LIPASE, AMYLASE in the last 168 hours. No results for input(s): AMMONIA in the last 168 hours. CBC: Recent Labs  Lab 04/06/18 0422 04/07/18 0519 04/08/18 0443 04/09/18 0500 04/10/18 0442  WBC 11.0* 9.3 8.2 7.0 8.8  NEUTROABS  --  4.3 3.4  --  4.0  HGB 8.7* 9.2* 10.6* 9.2* 8.7*  HCT 27.5* 29.2* 34.0* 30.7* 28.2*  MCV 92.6 91.3 91.2 93.9 92.8  PLT 618* 602* 700* 554* 548*   Cardiac Enzymes: No results for input(s): CKTOTAL, CKMB, CKMBINDEX, TROPONINI in the last 168 hours. BNP (last 3 results) No results for input(s): BNP in the last 8760 hours.  ProBNP (last 3 results) No results for input(s): PROBNP in the last 8760 hours.  CBG: Recent Labs  Lab 04/10/18 1851 04/10/18 2326 04/11/18 1146 04/11/18 1824 04/12/18 0007  GLUCAP 94 96 84 95 86    No results found for this or any previous visit (from the past 240 hour(s)).   Studies: No results found.  Scheduled Meds: . chlorhexidine  15 mL Mouth Rinse BID  . clonazePAM  1 mg Oral BID  . cloNIDine  0.3 mg Transdermal Weekly  . enoxaparin (LOVENOX) injection  40 mg Subcutaneous Q24H  . feeding supplement (ENSURE ENLIVE)  237 mL Oral TID BM  . folic acid  1 mg Oral Daily  . gabapentin  400 mg Oral BID  . hydrOXYzine  25 mg Oral Q8H  . mouth rinse  15 mL  Mouth Rinse q12n4p  . oxyCODONE  20 mg Oral Q6H  . QUEtiapine  300 mg Oral BID  . sertraline  50 mg Oral Daily  . thiamine  100 mg Oral Daily  . valACYclovir  1,000 mg Oral Daily   Continuous Infusions: . sodium chloride Stopped (04/10/18 1506)    Principal Problem:   Suicide attempt [name] Active Problems:   Intentional overdose of beta-adrenergic blocking drug (HCC)   Mucus plugging of bronchi   Hypoxia   Pressure injury of skin   ARDS (adult respiratory distress syndrome) (HCC)      Laverna Peace  [facility]

## 2018-04-12 NOTE — Progress Notes (Addendum)
Nutrition Follow-up  DOCUMENTATION CODES:   Not applicable  INTERVENTION:    Continue Ensure Enlive po TID, each supplement provides 350 kcal and 20 grams of protein  NUTRITION DIAGNOSIS:   Inadequate oral intake related to decreased appetite as evidenced by per patient/family report.  Resolved  GOAL:   Patient will meet greater than or equal to 90% of their needs  Met with PO intake of meals, snacks, and supplements.  MONITOR:   PO intake, Supplement acceptance   ASSESSMENT:   18 year old female with past medical hx of ADHD, anxiety, bipolar depression, previous suicide attempts, polysubstance abuse (cocaine, marijuana, heroin and alcohol abuse). She presented to the ED via EMS after mom found patient unresponsive. Bradycardic and hypotensive. Intubated. Given glucagon. Started on epi gtt. PCCM consulted for intentional OD (propanolol, xanax and Seroquel).  Plans to downsize trach today. TF was discontinued yesterday and Cortrak tube has been removed. Patient is progressing very well; no longer needs CIR. She is eating very well, taking Ensure supplements TID and eating meals and snacks. Family is bringing in snacks from home.   Labs and medications reviewed.  Diet Order:   Diet Order            Diet regular Room service appropriate? Yes; Fluid consistency: Thin  Diet effective now              EDUCATION NEEDS:   No education needs have been identified at this time  Skin:  Skin Assessment: Skin Integrity Issues: Skin Integrity Issues:: Stage II Stage II: Right upper face--scabbed  Last BM:  2/5  Height:   Ht Readings from Last 1 Encounters:  03/21/18 _0  (1.651 m) (61 %, Z= 0.27)*   * Growth percentiles are based on CDC (Girls, 2-20 Years) data.    Weight:   Wt Readings from Last 1 Encounters:  04/11/18 46.4 kg (5 %, Z= -1.62)*   * Growth percentiles are based on CDC (Girls, 2-20 Years) data.    Ideal Body Weight:  56.82 kg  BMI:  Body  mass index is 17.02 kg/m.  Estimated Nutritional Needs:   Kcal:  1600-1800  Protein:  65-75 gm  Fluid:  >/= 1.6 L    Molli Barrows, RD, LDN, [place] Pager [phone] After Hours Pager [phone]

## 2018-04-12 NOTE — Procedures (Signed)
Tracheostomy tube change: Informed verbal consent was obtained after explaining the risks (including bleeding and infection), benefits and alternatives of the procedure. Verbal timeout was performed prior to the procedure. The old  #6 cuffed trach was carefully removed. the tracheostomy site appeared: unremarkable. A new #  4 Cuffless trach was easily placed in the tracheostomy stoma and secured with velcro trach ties. The tracheostomy was patent, good color change observed via EZ-CAP, and the patient was easily able to voice with finger occlusion and tolerated the procedure well with no immediate complications.   Plan  Would assess for PMV today, if tolerates well would rapidly transition to Capping trials. She should be ready for decannulation in the next 48 hrs or so.   Simonne Martinet ACNP-BC [facility] Pulmonary/Critical Care Pager # [phone] OR # [phone] if no answer

## 2018-04-12 NOTE — Progress Notes (Signed)
  Speech Language Pathology Treatment: Lindsay Lara Speaking valve  Patient Details Name: Lindsay Lara MRN: [id number] DOB: [birth year] Today's Date: 04/12/2018 Time: 0865-7846 SLP Time Calculation (min) (ACUTE ONLY): 34 min  Assessment / Plan / Recommendation Clinical Impression  Pt's upper airway patency is greatly improved with smaller, cuffless trach (now #4 Shiley). She initially would cough the valve off every few minutes, but after clearing secretions and likely adjusting to sensation, she wore it for 20+ minutes with stable VS and no evidence of back pressure. Her voice is soft and hoarse, but given Mod cues she can increase her volume well. She continues to use gestures and whispers, but again, will produce phonation well when cued to do so. With Min cues faded to supervision, pt donned and doffed the valve herself multiple times. Per NP, plan is to progress to capping trials likely on next date. Given the above, recommend wearing PMV as much as tolerated today. Pt and mom are in agreement. Will continue to follow through decannulation - will likely not need f/u much beyond that.   HPI HPI: 18 yr old female w/ PMHx ADHD, Anxiety, Bipolar depression, previous suicide attempts, Polysubstance abuse (cocaine, Marijuana, heroin and ETOH) presents via EMS after Mom found pt unresponsive. Bradycardic and hypotensive. Intubated 1/16-1/20, reintubated several hours later 1/20- 1/24 (self extubated), reintubated 1/24-1/29. CXR 1/28 Stable hardware positioning and bilateral pneumonia.  She has now recieved a trach - Shiley #6 cuffed      SLP Plan  Continue with current plan of care       Recommendations         Patient may use Passy-Muir Speech Valve: During all waking hours (remove during sleep) PMSV Supervision: Full         Oral Care Recommendations: Oral care BID Follow up Recommendations: 24 hour supervision/assistance SLP Visit Diagnosis: Aphonia (R49.1) Plan: Continue with current  plan of care       GO                Virl Axe Lindsay Lara 04/12/2018, 4:48 PM  Natalia Leatherwood, M.A. CCC-SLP Acute Herbalist [phone] Office [phone]

## 2018-04-12 NOTE — Progress Notes (Signed)
Trach changed to #4 SH uncuffed.  ETCO2 good color change SPO2 100% on RA Trach collar

## 2018-04-12 NOTE — Progress Notes (Signed)
CSW continues to follow pt for further discharge needs. CSW aware that pt was seen by psychiatrist on 04/09/2018 where inpt psych treatment was recommended once pt has been medically cleared. CSW also aware that pt still has trach (1/31). CSW will remain involved for further needs.      Claude Manges Marites Nath, MSW, LCSW-A Emergency Department Clinical Social Worker [phone]

## 2018-04-13 ENCOUNTER — Inpatient Hospital Stay (HOSPITAL_COMMUNITY): Payer: PRIVATE HEALTH INSURANCE

## 2018-04-13 DIAGNOSIS — F329 Major depressive disorder, single episode, unspecified: Secondary | ICD-10-CM

## 2018-04-13 NOTE — Progress Notes (Signed)
[facility] PROGRESS NOTE  Lindsay Lara Detweiler WJX:[id number] DOB: 12/10/2000 DOA: 03/21/2018 PCP: Avanell ShackletonHenson, Vickie L, NP-C  Assessment/Plan: 1. Suicide attempt by intentional overdose.  Family reported decline in her mood in the setting of substance abuse.  Psychiatry recommends inpatient psychiatric hospitalization due to serious suicide attempt.   2. Acute hypoxic respiratory failure, resolved.  No longer requiring supplemental oxygen. Extubated 1/29 Tracheostomy in place.  Downsized on 2/7 and eventually plan to be decannulated per critical care. Doing well on room air with no respiratory distress 3. Tracheostomy dependence. In place with plan for decannulation while monitoring on smaller size. 4. Sinus tachycardia. Ongoing since 2/7. Likely related to dehydration? No concern for infection. Timeline doesn't make withdrawal seem likely. Continue to monitor closely  5. Seizure-like activity, resolved.  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  Neurology recommended repeat MRI brain when she is extubated ordered on 2/8 6. Bipolar/depression/anxiety, stable. Normal mood on exam. Seroquel 300 mg twice daily for mood stabilization and Zoloft milligrams daily for depression 7. Bradycardia, resolved  In setting of intentional overdose of propranolol.   8. Acute encephalopathy, resolved.  In setting of delirium and active psychiatric illness due to above.  Previously required Precedex while intubated.  Normal mentation currently.  9. ARDS/aspiration pneumonia.  Completed 5-day course of cefepime for aspiration pneumonia.  Tracheostomy placed on 1/31.  On room air.  Critical care plans to downsize trach and eventually decannulated  Code Status: Full code Family Communication: Father updated at bedside Disposition Plan: Has been progressing with physical therapy, no longer requires CIR, once medically stable  will need to transition to inpatient psychiatric hospital stay, awaiting repeat MRI Brain per Neurology recs, Pulm plans to down-size trach   Consultants:  Neurology, Critical Care, Psychiatry,   Procedures:  Intubated : 1/16- 1/19, 1/19-1/24, 1/24-1/29. Extubated 1/29. Tracheostomy placed 1/31  Antibiotics:  Valacyclovir 2/6-  Cefepime 1/27-1/31  Acyclovir 1/17-1/28, 1/30-2/2, 2/4-2/5.    Unasyn 1/19-1/21, 1/23-4/26  Ampicillin 1/21-1/22  Zosyn 1/18  Vancomycin 1/18, 1/26-1/27  HPI/Subjective:  Lindsay Lara Agresti is a 18 y.o. year old female with medical history significant for anxiety, bipolar, depression, previous suicide attempts, polysubstance abuse (cocaine, marijuana, heroin and alcohol) who presented on 03/21/2018 being found unresponsive by her mother and found to have attempted suicide with intentional overdose with Seroquel, propranolol and Xanax.  Hospital course complicated by multiple intubations/extubations (, hypertension brief requiring pressors, left pneumothorax with chest tube placement, ARDS and aspiration pneumonia with mucous plugging.  Most recently tracheostomy was placed on 1/31.  This a.m. has no acute complaints.  Dad at bedside. Did well with diet yesterday. One episode of emesis this am. No SOB or CP.  Objective: Vitals:   04/13/18 1150 04/13/18 1210  BP: (!) 87/66 (!) 139/103  Pulse: (!) 125 (!) 131  Resp: 20 (!) 29  Temp:    SpO2: 97% 97%    Intake/Output Summary (Last 24 hours) at 04/13/2018 1402 Last data filed at 04/12/2018 1950 Gross per 24 hour  Intake 237 ml  Output -  Net 237 ml   Filed Weights   04/10/18 0443 04/11/18 0500 04/13/18 0500  Weight: 47 kg 46.4 kg 43.7 kg    Exam:   Constitutional: Young female in no acute distress Eyes: EOMI, anicteric, normal conjunctivae Neck: Tracheostomy tube in place w/ passy-muir valve in place Cardiovascular: Tachycardic, no MRGs, with no peripheral edema Respiratory: Normal respiratory  effort  on room air, clear breath sounds  Abdomen: Soft,non-tender, MSK: Chest wall tenderness to palpation Skin: No rash ulcers, or lesions. Without skin tenting  Neurologic: Grossly no focal neuro deficit. Psychiatric:Appropriate affect, and mood. Mental status AAOx3  Data Reviewed: Basic Metabolic Panel: Recent Labs  Lab 04/07/18 0519 04/08/18 0443 04/09/18 0500 04/10/18 0442  NA 136 139 138 140  K 4.1 3.8 4.4 3.8  CL 102 104 103 107  CO2 26 24 24 28   GLUCOSE 87 93 113* 117*  BUN 13 13 15 10   CREATININE 0.43* 0.60 0.62 0.64  CALCIUM 8.5* 9.2 8.8* 8.9  MG  --   --  1.9 2.0  PHOS  --   --  4.6 4.9*   Liver Function Tests: Recent Labs  Lab 04/07/18 0519 04/08/18 0443  AST 41 31  ALT 50* 47*  ALKPHOS 105 108  BILITOT 0.5 0.5  PROT 5.7* 6.3*  ALBUMIN 2.4* 2.8*   No results for input(s): LIPASE, AMYLASE in the last 168 hours. No results for input(s): AMMONIA in the last 168 hours. CBC: Recent Labs  Lab 04/07/18 0519 04/08/18 0443 04/09/18 0500 04/10/18 0442  WBC 9.3 8.2 7.0 8.8  NEUTROABS 4.3 3.4  --  4.0  HGB 9.2* 10.6* 9.2* 8.7*  HCT 29.2* 34.0* 30.7* 28.2*  MCV 91.3 91.2 93.9 92.8  PLT 602* 700* 554* 548*   Cardiac Enzymes: No results for input(s): CKTOTAL, CKMB, CKMBINDEX, TROPONINI in the last 168 hours. BNP (last 3 results) No results for input(s): BNP in the last 8760 hours.  ProBNP (last 3 results) No results for input(s): PROBNP in the last 8760 hours.  CBG: Recent Labs  Lab 04/10/18 1851 04/10/18 2326 04/11/18 1146 04/11/18 1824 04/12/18 0007  GLUCAP 94 96 84 95 86    No results found for this or any previous visit (from the past 240 hour(s)).   Studies: No results found.  Scheduled Meds: . clonazePAM  1 mg Oral BID  . cloNIDine  0.3 mg Transdermal Weekly  . enoxaparin (LOVENOX) injection  40 mg Subcutaneous Q24H  . feeding supplement (ENSURE ENLIVE)  237 mL Oral TID BM  . folic acid  1 mg Oral Daily  . gabapentin  400 mg Oral  BID  . hydrOXYzine  25 mg Oral Q8H  . oxyCODONE  20 mg Oral Q6H  . QUEtiapine  300 mg Oral BID  . sertraline  50 mg Oral Daily  . thiamine  100 mg Oral Daily  . valACYclovir  1,000 mg Oral Daily   Continuous Infusions: . sodium chloride Stopped (04/10/18 1506)    Principal Problem:   Suicide attempt (HCC) Active Problems:   MDD (major depressive disorder)   Substance induced mood disorder (HCC)   Polysubstance abuse (HCC)   Sedative, hypnotic or anxiolytic dependence (HCC)   Severe bipolar I disorder, current or most recent episode depressed (HCC)   Intentional overdose of beta-adrenergic blocking drug (HCC)   Mucus plugging of bronchi   Hypoxia   Pressure injury of skin   ARDS (adult respiratory distress syndrome) (HCC)   Status post tracheostomy (HCC)   Tracheostomy dependent (HCC)      Laverna Peace  [facility]

## 2018-04-14 DIAGNOSIS — R000 Tachycardia, unspecified: Secondary | ICD-10-CM

## 2018-04-14 MED ORDER — SENNOSIDES-DOCUSATE SODIUM 8.6-50 MG PO TABS
2.0000 | ORAL_TABLET | Freq: Two times a day (BID) | ORAL | Status: DC
  Administered 2018-04-14 – 2018-04-17 (×6): 2 via ORAL
  Filled 2018-04-14 (×7): qty 2

## 2018-04-14 MED ORDER — OXYCODONE HCL 5 MG PO TABS
15.0000 mg | ORAL_TABLET | Freq: Four times a day (QID) | ORAL | Status: DC
  Administered 2018-04-14 – 2018-04-16 (×9): 15 mg via ORAL
  Filled 2018-04-14 (×9): qty 3

## 2018-04-14 MED ORDER — POLYETHYLENE GLYCOL 3350 17 G PO PACK
17.0000 g | PACK | Freq: Two times a day (BID) | ORAL | Status: DC
  Administered 2018-04-14 – 2018-04-17 (×5): 17 g via ORAL
  Filled 2018-04-14 (×7): qty 1

## 2018-04-14 MED ORDER — METOPROLOL TARTRATE 12.5 MG HALF TABLET
12.5000 mg | ORAL_TABLET | Freq: Two times a day (BID) | ORAL | Status: DC
  Administered 2018-04-14 – 2018-04-17 (×7): 12.5 mg via ORAL
  Filled 2018-04-14 (×7): qty 1

## 2018-04-14 MED ORDER — GUAIFENESIN-DM 100-10 MG/5ML PO SYRP
10.0000 mL | ORAL_SOLUTION | ORAL | Status: DC | PRN
  Administered 2018-04-14: 10 mL via ORAL
  Filled 2018-04-14 (×2): qty 10

## 2018-04-14 NOTE — Progress Notes (Signed)
Sitter remains at bedside for suicide precautions. Pt sleeping soundly, pt mother at bedside concern for pt length of sleeping. Discussed with pt mother nursing staff will address her noted concerns with doctor. Reassured mother any necessary medication adjustments will be made per md recommendations.

## 2018-04-14 NOTE — Progress Notes (Signed)
PCCM progress note  Patient tolerated Passy-Muir valve well over the weekend Trach capped today.  No distress or desaturation noted If she does well with capping trial then we may assess for decannulation in 24 to 48 hours  Chilton Greathouse MD [facility] Pager [phone] If no answer call [phone] 04/14/2018, 11:43 AM
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
## 2018-04-14 NOTE — Progress Notes (Signed)
eLink Physician-Brief Progress Note Patient Name: Lindsay Lara DOB: 2000-04-21 MRN: [id number]   Date of Service  04/14/2018  HPI/Events of Note  Cough - Request for cough medication.  eICU Interventions  Will order: 1. Robitussin DM 10 mL PO Q 4 hours PRN cough.      Intervention Category Major Interventions: Other:  [name] 04/14/2018, 10:51 PM

## 2018-04-15 DIAGNOSIS — R000 Tachycardia, unspecified: Secondary | ICD-10-CM | POA: Clinically undetermined
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
## 2018-04-15 NOTE — Progress Notes (Signed)
Nurse report called to 2W staff nurse. Pt fully alert calm and cooperative transported via wheelchair, pt mother at side, pt and pt mother thankful to staff.

## 2018-04-15 NOTE — Progress Notes (Addendum)
NAME:  Lindsay Lara, MRN:  [id number], DOB:  01-Feb-2001, LOS: 25 ADMISSION DATE:  03/21/2018, CONSULTATION DATE:  03/21/2018 REFERRING MD:  Dr. [name] Carl CHIEF COMPLAINT:  Altered Mental Status    Brief History   18 yr old female w/ PMHx ADHD, Anxiety, Bipolar depression,previous suicide attempts,Polysubstance abuse (cocaine, Marijuana,heroin and ETOH) presentsvia EMS after Mom found pt unresponsive. Bradycardic to 40sandhypotensive with SBP in the 40s. Intubated.Given Glucagon.Started on epi gtt. PCCM consulted for Intentional OD (propanolol, xanax and Seroquel)  Significant Hospital Events   1/16 Admission  1/16 Seen by Neurology - MRI for possible lateralizing signs 2/06 Transferred [facility] with pulmonary critical care to follow for trach 2/07 Trached changed to #4 cuffless  2/09 Trach capped  2/10  Trach removed   Consults:  PCCM Neurology  Psychiatry Rehabilitation medicine  Procedures:  1/16 Intubated 1/16 CVL placed discontinued 04/10/2018 1/17 chest tube placed out  1/18 Bedside bronchoscopy, therapeutic suctioning  1/19 extubated>reintubated 1/24 extubated 1/24 reintubated  1/29 extubated  1/31 Trach  Significant Diagnostic Tests:  CT head: Unremarkable  EKG: RBBB, Prolonged QTc 528 EEG: possible right focal cerebral dysfunction. No clear seizure activity. MRI: Negative for acute infarct or mass, Question diffuse cortical edema right hemisphere on axial T2 images.While this could be due to artifact, consider seizure related cortical edema. EEG 1/23: no seizure activity   Micro Data:  1/18 Urine Cx > E. coli 1/18 Tracheal Aspirate Cx > Rare GNR, pending sensitivity/cx  1/21 Repeat Resp culture > rare candida  1/24 Sputum > normal resp flora  1/25 Blood > negative   Antimicrobials:  Zosyn 1/18 > 1/19 Vancomycin 1/18 >1/19, 1/26>1/27 Unasyn 1/19 > 1/26 Ampicillin 1/21 > 1/22 Cefepime 1/26 >1/31  Interim history/subjective:  Trach capped since 2/09, no  acute events.  Pt 100% on RA, able to cough / clear secretions.  Father at bedside. She is asking for versed for trach removal.    Objective   Blood pressure 131/85, pulse 89, temperature 97.6 F (36.4 C), temperature source Oral, resp. rate 18, height 5\' 5"  (1.651 m), weight 43.7 kg, last menstrual period 03/21/2018, SpO2 99 %.        Intake/Output Summary (Last 24 hours) at 04/15/2018 1120 Last data filed at 04/14/2018 1945 Gross per 24 hour  Intake 840 ml  Output 700 ml  Net 140 ml   Filed Weights   04/10/18 0443 04/11/18 0500 04/13/18 0500  Weight: 47 kg 46.4 kg 43.7 kg   Physical Exam: General: thin young adult female sitting up on side of bed in NAD HEENT: MM pink/moist, trach midline c/d/i Neuro: AAOx4, speech clear, MAE  CV: s1s2 rrr, no m/r/g PULM: even/non-labored, lungs bilaterally clear  BU:LAGT, non-tender, bsx4 active  Extremities: warm/dry, no edema  Skin: no rashes or lesions  Resolved Hospital Problem list   Left apical Pneumothorax   Acute respiratory failure - secondary to respiratory depression from intentional overdose  Acute encephalopathy secondary to delirium and mental health diagnosis -suspect she may be withdrawing from opiates.  EEG with diffuse changes consistent with metabolic encephalopathy.  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  Assessment & Plan:  18 yr old female w/ PMHx ADHD, Anxiety, Bipolar depression,previous suicide attempts,Polysubstance abuse (cocaine, Marijuana,heroin and ETOH) presentsfollowing intentional drug overdose with Propranolol and Seroquel. Utox positive for benzos and THC.   ARDS/Aspiration pneumonia s/p tracheostomy 1/31 -24 + hours capped, tolerating well  P: Proceed with decannulation  Keep site covered with small occlusive dressing  She does not need pain medications  for trach removal or sedation.  Informed patient she does not need sedation for procedure Discontinue Haldol   Suicide Attempt  Hx prior SI and attempts Hx  polysubstance abuse ADHD Bipolar and anxiety disorder P: Per primary  Continue 1:1 sitter  Suspect she will need inpatient PSY    Best practice:  Diet:Regular DVT prophylaxis:Lovenox GI prophylaxis:none Mobility: OOB as tolerated, referred to IP rehab Code Status:FULL Family Communication: Father updated at bedside.    Labs   CBC: Recent Labs  Lab 04/09/18 0500 04/10/18 0442  WBC 7.0 8.8  NEUTROABS  --  4.0  HGB 9.2* 8.7*  HCT 30.7* 28.2*  MCV 93.9 92.8  PLT 554* 548*    Basic Metabolic Panel: Recent Labs  Lab 04/09/18 0500 04/10/18 0442  NA 138 140  K 4.4 3.8  CL 103 107  CO2 24 28  GLUCOSE 113* 117*  BUN 15 10  CREATININE 0.62 0.64  CALCIUM 8.8* 8.9  MG 1.9 2.0  PHOS 4.6 4.9*   GFR: Estimated Creatinine Clearance: 78 mL/min (by C-G formula based on SCr of 0.64 mg/dL). Recent Labs  Lab 04/09/18 0500 04/10/18 0442  WBC 7.0 8.8    Liver Function Tests: No results for input(s): AST, ALT, ALKPHOS, BILITOT, PROT, ALBUMIN in the last 168 hours. No results for input(s): LIPASE, AMYLASE in the last 168 hours. No results for input(s): AMMONIA in the last 168 hours.  ABG    Component Value Date/Time   PHART 7.584 (H) 04/05/2018 1220   PCO2ART 28.2 (L) 04/05/2018 1220   PO2ART 130.0 (H) 04/05/2018 1220   HCO3 26.7 04/05/2018 1220   TCO2 28 04/05/2018 1220   ACIDBASEDEF 1.0 03/28/2018 0420   O2SAT 99.0 04/05/2018 1220     Coagulation Profile: No results for input(s): INR, PROTIME in the last 168 hours.  Cardiac Enzymes: No results for input(s): CKTOTAL, CKMB, CKMBINDEX, TROPONINI in the last 168 hours.  HbA1C: Hgb A1c MFr Bld  Date/Time Value Ref Range Status  02/10/2018 06:55 AM 5.0 4.8 - 5.6 % Final    Comment:    (NOTE) Pre diabetes:          5.7%-6.4% Diabetes:              >6.4% Glycemic control for   <7.0% adults with diabetes   09/17/2015 06:42 PM 5.2 4.8 - 5.6 % Final    Comment:    (NOTE)         Pre-diabetes: 5.7 - 6.4          Diabetes: >6.4         Glycemic control for adults with diabetes: <7.0     CBG: Recent Labs  Lab 04/10/18 1851 04/10/18 2326 04/11/18 1146 04/11/18 1824 04/12/18 0007  GLUCAP 94 96 84 95 86    Canary BrimBrandi Daichi Moris, NP-C [facility] Pgr: [phone] or if no answer [phone] 04/15/2018, 11:48 AM

## 2018-04-15 NOTE — Progress Notes (Signed)
Physical Therapy Treatment Patient Details Name: Lindsay Lara MRN: [id number] DOB: 24-May-2000 Today's Date: 04/15/2018    History of Present Illness Pt is a 18 yr old female w/ PMHx ADHD, Anxiety, Bipolar depression, previous suicide attempts, Polysubstance abuse (cocaine, Marijuana, heroin and ETOH) presents via EMS after Mom found pt unresponsive. Bradycardic and hypotensive. Intubated 1/16-1/20, reintubated several hours later 1/20- 1/24 (self extubated), reintubated 1/24-1/29. Trach placed on 1/31.  Decannulated 04/15/18.      PT Comments    Pt is progressing well with her gait and mobility.  She is walking more with staff (outside of PT sessions) and is getting more balanced on her feet.  We worked on Advertising account planner and I think she would benefit from some static standing balance practice as well (tandem and single leg stand) next session.  PT will continue to follow acutely for safe mobility progression.  O2 sats and HR remained stable on RA following decannulation earlier today.  Follow Up Recommendations  Outpatient PT     Equipment Recommendations  None recommended by PT    Recommendations for Other Services   NA     Precautions / Restrictions Precautions Precautions: Fall Precaution Comments: suicide, mildly unstable on her feet.    Mobility  Bed Mobility Overal bed mobility: Modified Independent                Transfers Overall transfer level: Needs assistance Equipment used: None Transfers: Sit to/from Stand Sit to Stand: Supervision         General transfer comment: Supervision for safety due to balance deficits  Ambulation/Gait Ambulation/Gait assistance: Min guard Gait Distance (Feet): 300 Feet Assistive device: None;1 person hand held assist Gait Pattern/deviations: Step-through pattern;Staggering right;Staggering left Gait velocity: decreased(but can speed up if asked) Gait velocity interpretation: 1.31 - 2.62 ft/sec,  indicative of limited community ambulator General Gait Details: Mildly staggering gait pattern, very light hand held assist.           Balance Overall balance assessment: Needs assistance Sitting-balance support: Feet supported;No upper extremity supported Sitting balance-Leahy Scale: Good     Standing balance support: No upper extremity supported Standing balance-Leahy Scale: Fair                       Dynamic Gait Index Level Surface: Mild Impairment Change in Gait Speed: Mild Impairment Gait with Horizontal Head Turns: Mild Impairment Gait with Vertical Head Turns: Mild Impairment Gait and Pivot Turn: Mild Impairment Step Over Obstacle: Mild Impairment      Cognition Arousal/Alertness: Awake/alert Behavior During Therapy: Flat affect                                   General Comments: Generally flat affect, will smile on occasions.  Speaks when spoken to, general conversation normal, but cognition not specifically tested.          General Comments General comments (skin integrity, edema, etc.): O2 sats and HR stable during gait.       Pertinent Vitals/Pain Pain Assessment: Faces Faces Pain Scale: No hurt           PT Goals (current goals can now be found in the care plan section) Acute Rehab PT Goals Patient Stated Goal: Pt did not state Progress towards PT goals: Progressing toward goals    Frequency    Min 3X/week      PT Plan  Current plan remains appropriate       AM-PAC PT "6 Clicks" Mobility   Outcome Measure  Help needed turning from your back to your side while in a flat bed without using bedrails?: None Help needed moving from lying on your back to sitting on the side of a flat bed without using bedrails?: None Help needed moving to and from a bed to a chair (including a wheelchair)?: None Help needed standing up from a chair using your arms (e.g., wheelchair or bedside chair)?: None Help needed to walk in  hospital room?: A Little Help needed climbing 3-5 steps with a railing? : A Little 6 Click Score: 22    End of Session Equipment Utilized During Treatment: Gait belt Activity Tolerance: Patient limited by fatigue;Other (comment)(given Haldol today) Patient left: in bed;with call bell/phone within reach;with nursing/sitter in room;with family/visitor present   PT Visit Diagnosis: Other abnormalities of gait and mobility (R26.89)     Time: 1732-1750 PT Time Calculation (min) (ACUTE ONLY): 18 min  Charges:  $Gait Training: 8-22 mins                    Cj Edgell B. Salome Cozby, PT, DPT  Acute Rehabilitation [phone] pager #[phone] office   04/15/2018, 6:00 PM

## 2018-04-15 NOTE — Care Management Note (Signed)
Case Management Note  Patient Details  Name: MECHELL SOUTHARD MRN: [id number] Date of Birth: 02-15-2001  Subjective/Objective:    From home with mother, intentional drug OD, trached on 1/31, started capping trial on 2/9 for decannulation.  Has hx of bipolar,depression, anxiety, and previous suicide attempts, psa , PSY following, plan for inpatient psych at discahrge.                 Action/Plan: NCM will follow for transition of care needs.  Expected Discharge Date:                  Expected Discharge Plan:  Psychiatric Hospital  In-House Referral:  Clinical Social Work  Discharge planning Services  CM Consult  Post Acute Care Choice:    Choice offered to:     DME Arranged:    DME Agency:     HH Arranged:    HH Agency:     Status of Service:  In process, will continue to follow  If discussed at Long Length of Stay Meetings, dates discussed:    Additional Comments:  Leone Haven, RN 04/15/2018, 9:14 AM

## 2018-04-15 NOTE — Progress Notes (Signed)
  Speech Language Pathology Treatment: Dysphagia;Cognitive-Linquistic  Patient Details Name: Lindsay Lara MRN: [id number] DOB: 18-Nov-2000 Today's Date: 04/15/2018 Time: 3254-9826 SLP Time Calculation (min) (ACUTE ONLY): 8 min  Assessment / Plan / Recommendation Clinical Impression  Pt was drowsy this afternoon, but was still agreeable to therapy. PMV is no longer needed as pt was decannulated this afternoon. Her vocal intensity is more audible than it was on Friday when last SLP session was, but she is still moderately hoarse and mildly breathy. SLP provided education about use of mild pressure on dressing to occlude stoma until it heals. SLP then utilized strategy with Mod I to produce louder, clearer phonation. We discussed this strategy as a means to produce a stronger cough as she is healing, too. She consumed thin liquids with no overt difficulty but politely declined solids due to nausea. She had recently eaten half of a hamburger per mother's report. Her mother also describes an increase in coughing over the last few days, but she describes it as dry, not necessarily during intake. She remains afebrile and her lungs are clear. SLP will continue to follow briefly for diet tolerance and communication, although anticipate that she will need only brief follow up.    HPI HPI: 18 yr old female w/ PMHx ADHD, Anxiety, Bipolar depression, previous suicide attempts, Polysubstance abuse (cocaine, Marijuana, heroin and ETOH) presents via EMS after Mom found pt unresponsive. Bradycardic and hypotensive. Intubated 1/16-1/20, reintubated several hours later 1/20- 1/24 (self extubated), reintubated 1/24-1/29. CXR 1/28 Stable hardware positioning and bilateral pneumonia.  She has now recieved a trach - Shiley #6 cuffed      SLP Plan  Continue with current plan of care       Recommendations  Diet recommendations: Regular;Thin liquid Liquids provided via: Cup;Straw Medication Administration: Whole meds  with liquid Supervision: Patient able to self feed;Intermittent supervision to cue for compensatory strategies Compensations: Slow rate;Small sips/bites Postural Changes and/or Swallow Maneuvers: Seated upright 90 degrees                Oral Care Recommendations: Oral care BID Follow up Recommendations: 24 hour supervision/assistance SLP Visit Diagnosis: Aphonia (R49.1) Plan: Continue with current plan of care       GO                Virl Axe Marshel Golubski 04/15/2018, 2:31 PM  Natalia Leatherwood, M.A. CCC-SLP Acute Herbalist [phone] Office [phone]

## 2018-04-15 NOTE — Progress Notes (Signed)
Pt fully alert, informed pt of scheduled med due at 0200, pt does not want to skip dose and desires to be awakened from a deep sleep for medication commenting "it helps with my withdrawal" "I shake if I do not take it". Pt mother at bedside calmly comments I do not think it should be given it she is sleeping soundly.

## 2018-04-15 NOTE — Progress Notes (Signed)
Stoma assessed Pt doing well no respiratory issues at this time

## 2018-04-15 NOTE — Progress Notes (Signed)
Trach removed per orders from Canary Brim, NP-C

## 2018-04-15 NOTE — Progress Notes (Signed)
OT Cancellation Note  Patient Details Name: PANSEY TRANA MRN: [id number] DOB: Aug 21, 2000   Cancelled Treatment:    Reason Eval/Treat Not Completed: Fatigue/lethargy limiting ability to participate;Other (comment).  First attempt @1417 : Pt reporting she had a busy morning and did not sleep well last night. Request OT return later this afternoon once she has slept. Will return as schedule allows.  Second attempt @ 1544: Pt in heavy sleep. Attempting to wake. Will let pt rest and return another time. NT reporting that pt walked this morning.   Georgeanne Frankland M Gionni Freese Aashish Hamm MSOT, OTR/L Acute Rehab Pager: [phone] Office: [phone] 04/15/2018, 3:52 PM

## 2018-04-16 LAB — BASIC METABOLIC PANEL
Anion gap: 11 (ref 5–15)
BUN: 8 mg/dL (ref 6–20)
CO2: 26 mmol/L (ref 22–32)
CREATININE: 0.63 mg/dL (ref 0.44–1.00)
Calcium: 9.6 mg/dL (ref 8.9–10.3)
Chloride: 101 mmol/L (ref 98–111)
GFR calc Af Amer: >60 mL/min (ref >60)
GFR calc non Af Amer: >60 mL/min (ref >60)
Glucose, Bld: 98 mg/dL (ref 70–99)
Potassium: 4.1 mmol/L (ref 3.5–5.1)
Sodium: 138 mmol/L (ref 135–145)

## 2018-04-16 LAB — CBC
HCT: 35.8 % — ABNORMAL LOW (ref 36.0–46.0)
Hemoglobin: 11.4 g/dL — ABNORMAL LOW (ref 12.0–15.0)
MCH: 29.5 pg (ref 26.0–34.0)
MCHC: 31.8 g/dL (ref 30.0–36.0)
MCV: 92.5 fL (ref 80.0–100.0)
Platelets: 491 10*3/uL — ABNORMAL HIGH (ref 150–400)
RBC: 3.87 MIL/uL (ref 3.87–5.11)
RDW: 13.8 % (ref 11.5–15.5)
WBC: 8.5 10*3/uL (ref 4.0–10.5)
nRBC: 0 % (ref 0.0–0.2)

## 2018-04-16 MED ORDER — OXYCODONE HCL 5 MG PO TABS
10.0000 mg | ORAL_TABLET | Freq: Four times a day (QID) | ORAL | Status: DC
  Administered 2018-04-16 – 2018-04-17 (×4): 10 mg via ORAL
  Filled 2018-04-16 (×4): qty 2

## 2018-04-16 NOTE — Progress Notes (Signed)
[facility] PROGRESS NOTE  Lindsay AlaMadison E Crayton AVW:[id number]:[id number] DOB: 2000-05-12 DOA: 03/21/2018 PCP: Avanell ShackletonHenson, Vickie L, NP-C  Assessment/Plan: 1. Suicide attempt by intentional overdose.  Family reported decline in her mood in the setting of substance abuse.  Psychiatry recommends inpatient psychiatric hospitalization due to serious suicide attempt.  Plan to transition to behavioral health unit for such once medically stable.  2. Persistent sinus tachycardia, resolved.  Unclear etiology.  TTE showed preserved EF with no acute abnormalities.  TSH within normal limits, no acute abnormalities to explain.  Unclear how long patient was taken propranolol for withdrawal could be contributing?  Continue low-dose metoprolol tartrate 12.5 mg twice daily and monitor closely  3. Acute hypoxic respiratory failure, resolved.  No longer requiring supplemental oxygen.  Decannulated by PCCM on 2/10.  Has had no complications during the first 24 hours of monitoring  4. Seizure-like activity, resolved.  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  Repeat MRI shows no edema.   5. Bipolar/depression/anxiety, stable. Normal mood on exam. Seroquel 300 mg twice daily for mood stabilization and Zoloft milligrams daily for depression.  Clonidine patch, hydroxyzine 25 mg every 8, continue titrating down oxycodone IR from, decrease to 10 mg every 6 hours and monitor  6. Bradycardia, resolved  In setting of intentional overdose of propranolol.    7. Acute encephalopathy, resolved.  In setting of delirium and active psychiatric illness due to above.  Previously required Precedex while intubated.  Normal mentation currently.   8. ARDS/aspiration pneumonia.  Completed 5-day course of cefepime for aspiration pneumonia.  Tracheostomy placed on 1/31, decannulated on 2/10.  On room air.    Code Status: Full code Family Communication: no  family at bedside Disposition Plan:  pulmonary decannulated on 2/10, did well overnight, continuing to taper oxycodone, monitor for additional 24 hours if stable believe patient is medically ready for inpatient psychiatric hospitalization given significantly serious suicide attempt (this was discussed with psychiatry who will not change assessment/recommendation)   Consultants:  Neurology, Critical Care, Psychiatry,   Procedures:  Intubated : 1/16- 1/19, 1/19-1/24, 1/24-1/29. Extubated 1/29. Tracheostomy placed 1/31, Decannulated 2/10  Antibiotics:  Valacyclovir 2/6-  Cefepime 1/27-1/31  Acyclovir 1/17-1/28, 1/30-2/2, 2/4-2/5.    Unasyn 1/19-1/21, 1/23-4/26  Ampicillin 1/21-1/22  Zosyn 1/18  Vancomycin 1/18, 1/26-1/27  HPI/Subjective:  Lindsay Lara is a 18 y.o. year old female with medical history significant for anxiety, bipolar, depression, previous suicide attempts, polysubstance abuse (cocaine, marijuana, heroin and alcohol) who presented on 03/21/2018 being found unresponsive by her mother and found to have attempted suicide with intentional overdose with Seroquel, propranolol and Xanax.  Hospital course complicated by multiple intubations/extubations (, hypertension brief requiring pressors, left pneumothorax with chest tube placement, ARDS and aspiration pneumonia with mucous plugging.  Most recently tracheostomy was placed on 1/31.  No complaints this morning.  No dyspnea, no cough  Objective: Vitals:   04/16/18 1200 04/16/18 1344  BP:    Pulse: 93 (!) 112  Resp: 13 (!) 21  Temp:    SpO2: 100% 97%    Intake/Output Summary (Last 24 hours) at 04/16/2018 1739 Last data filed at 04/16/2018 0100 Gross per 24 hour  Intake 480 ml  Output -  Net 480 ml   Filed Weights   04/10/18 0443 04/11/18 0500 04/13/18 0500  Weight: 47 kg 46.4 kg 43.7 kg    Exam:   Constitutional: Young female in no acute distress Eyes: EOMI,  anicteric, normal conjunctivae Neck: Dressing  in place on anterior neck Cardiovascular: Regular rate no MRGs, with no peripheral edema Respiratory: Normal respiratory effort on room air, clear breath sounds  Abdomen: Soft,non-tender, MSK: Chest wall tenderness to palpation Skin: No rash ulcers, or lesions. Without skin tenting  Neurologic: Grossly no focal neuro deficit. Psychiatric:Appropriate affect, and anxious mood. Mental status AAOx3  Data Reviewed: Basic Metabolic Panel: Recent Labs  Lab 04/10/18 0442 04/16/18 0233  NA 140 138  K 3.8 4.1  CL 107 101  CO2 28 26  GLUCOSE 117* 98  BUN 10 8  CREATININE 0.64 0.63  CALCIUM 8.9 9.6  MG 2.0  --   PHOS 4.9*  --    Liver Function Tests: No results for input(s): AST, ALT, ALKPHOS, BILITOT, PROT, ALBUMIN in the last 168 hours. No results for input(s): LIPASE, AMYLASE in the last 168 hours. No results for input(s): AMMONIA in the last 168 hours. CBC: Recent Labs  Lab 04/10/18 0442 04/16/18 0233  WBC 8.8 8.5  NEUTROABS 4.0  --   HGB 8.7* 11.4*  HCT 28.2* 35.8*  MCV 92.8 92.5  PLT 548* 491*   Cardiac Enzymes: No results for input(s): CKTOTAL, CKMB, CKMBINDEX, TROPONINI in the last 168 hours. BNP (last 3 results) No results for input(s): BNP in the last 8760 hours.  ProBNP (last 3 results) No results for input(s): PROBNP in the last 8760 hours.  CBG: Recent Labs  Lab 04/10/18 1851 04/10/18 2326 04/11/18 1146 04/11/18 1824 04/12/18 0007  GLUCAP 94 96 84 95 86    No results found for this or any previous visit (from the past 240 hour(s)).   Studies: No results found.  Scheduled Meds: . clonazePAM  1 mg Oral BID  . cloNIDine  0.3 mg Transdermal Weekly  . enoxaparin (LOVENOX) injection  40 mg Subcutaneous Q24H  . feeding supplement (ENSURE ENLIVE)  237 mL Oral TID BM  . folic acid  1 mg Oral Daily  . gabapentin  400 mg Oral BID  . hydrOXYzine  25 mg Oral Q8H  . metoprolol tartrate  12.5 mg Oral BID  . oxyCODONE  15 mg Oral Q6H  . polyethylene glycol   17 g Oral BID  . QUEtiapine  300 mg Oral BID  . senna-docusate  2 tablet Oral BID  . sertraline  50 mg Oral Daily  . thiamine  100 mg Oral Daily  . valACYclovir  1,000 mg Oral Daily   Continuous Infusions: . sodium chloride Stopped (04/10/18 1506)    Principal Problem:   Suicide attempt (HCC) Active Problems:   MDD (major depressive disorder)   Substance induced mood disorder (HCC)   Polysubstance abuse (HCC)   Sedative, hypnotic or anxiolytic dependence (HCC)   Severe bipolar I disorder, current or most recent episode depressed (HCC)   Intentional overdose of beta-adrenergic blocking drug (HCC)   Mucus plugging of bronchi   Hypoxia   Pressure injury of skin   ARDS (adult respiratory distress syndrome) (HCC)   Status post tracheostomy (HCC)   Tracheostomy dependent (HCC)   Sinus tachycardia      Laverna PeaceShayla D Jermiyah Ricotta  [facility]

## 2018-04-16 NOTE — Clinical Social Work Note (Addendum)
CSW spoke with Lindsay Lara at bedside. Lindsay Lara states she would really like to talk to psych. Lindsay Lara states last time they talked to her she could not talk. Lindsay Lara states she would like to tell them more. Lindsay Lara states if the recommendation still stands she would be agreeable to go to [facility]. Lindsay Lara states she does not want to go to [facility] as it was tuff on her and her family. Lindsay Lara's mother at bedside and agreed. CSW messaged MD to let her know Lindsay Lara is requesting to speak to psych. Referral made to [facility]. Voluntary form signed by Lindsay Lara and witnessed by CSW at bedside.  [place], [place] [phone]

## 2018-04-16 NOTE — Progress Notes (Signed)
Occupational Therapy Treatment Patient Details Name: Lindsay Lara MRN: [id number] DOB: 2001/01/25 Today's Date: 04/16/2018    History of present illness Pt is a 18 yr old female w/ PMHx ADHD, Anxiety, Bipolar depression, previous suicide attempts, Polysubstance abuse (cocaine, Marijuana, heroin and ETOH) presents via EMS after Mom found pt unresponsive. Bradycardic and hypotensive. Intubated 1/16-1/20, reintubated several hours later 1/20- 1/24 (self extubated), reintubated 1/24-1/29. Trach placed on 1/31.  Decannulated 04/15/18.     OT comments  Pt making good progress toward OT goals this session. Pt able to progress toward higher level balance activities, including single leg stance with cueing for technique. Practiced reaching outside of BOS for carryover to ADL/IADLs. Discussed activity grading with pt and mom present throughout session to increase endurance and strength. OT follow up at behavioral health will be beneficial to ensure return to engagement in meaningful activities.    Follow Up Recommendations  No OT follow up    Equipment Recommendations  None recommended by OT    Recommendations for Other Services      Precautions / Restrictions Precautions Precautions: Fall Precaution Comments: suicide precaution Restrictions Weight Bearing Restrictions: No       Mobility Bed Mobility Overal bed mobility: Modified Independent Bed Mobility: Supine to Sit     Supine to sit: Modified independent (Device/Increase time)     General bed mobility comments: pt seated at EOB upon arrival  Transfers Overall transfer level: Needs assistance Equipment used: None Transfers: Sit to/from [employer] Sit to Stand: Modified independent (Device/Increase time) Stand pivot transfers: Supervision            Balance Overall balance assessment: Needs assistance Sitting-balance support: Feet supported;No upper extremity supported Sitting balance-Leahy Scale: Good      Standing balance support: No upper extremity supported Standing balance-Leahy Scale: Fair                 High Level Balance Comments: CGA provided during high level balance activities, including reaching outside of BOS and single leg stance           ADL either performed or assessed with clinical judgement   ADL Overall ADL's : Needs assistance/impaired Eating/Feeding: Independent                       Toilet Transfer: Supervision/safety;Ambulation   Toileting- Clothing Manipulation and Hygiene: Supervision/safety;Sit to/from stand       Functional mobility during ADLs: Min guard General ADL Comments: MIn guard provided during dynamic standing balance  activities               Cognition Arousal/Alertness: Awake/alert Behavior During Therapy: Flat affect;Anxious Overall Cognitive Status: Difficult to assess                                 General Comments: Generally flat affect, will smile on occasions.  Speaks when spoken to, general conversation normal, but cognition not specifically tested.               General Comments During dynamic, moderate intensity exercises, pt's HR remained between 115-135 BPM. O2 remained stable >95% throughout session    Pertinent Vitals/ Pain       Pain Assessment: 0-10 Pain Score: 4  Pain Location: L chest Pain Descriptors / Indicators: Constant Pain Intervention(s): Monitored during session         Frequency  Min 2X/week  Progress Toward Goals  OT Goals(current goals can now be found in the care plan section)  Progress towards OT goals: Progressing toward goals  Acute Rehab OT Goals Patient Stated Goal: None stated OT Goal Formulation: With patient/family Time For Goal Achievement: 04/21/18 Potential to Achieve Goals: Good  Plan Discharge plan needs to be updated    AM-PAC OT "6 Clicks" Daily Activity     Outcome Measure   Help from another person eating meals?:  None Help from another person taking care of personal grooming?: None Help from another person toileting, which includes using toliet, bedpan, or urinal?: A Little Help from another person bathing (including washing, rinsing, drying)?: A Little Help from another person to put on and taking off regular upper body clothing?: None Help from another person to put on and taking off regular lower body clothing?: A Little 6 Click Score: 21    End of Session Equipment Utilized During Treatment: Gait belt  OT Visit Diagnosis: Unsteadiness on feet (R26.81);Other abnormalities of gait and mobility (R26.89);Muscle weakness (generalized) (M62.81)   Activity Tolerance Patient tolerated treatment well   Patient Left in bed;with call bell/phone within reach;with family/visitor present   Nurse Communication          Time: 4765-4650 OT Time Calculation (min): 25 min  Charges: OT General Charges $OT Visit: 1 Visit OT Treatments $Self Care/Home Management : 8-22 mins $Therapeutic Activity: 8-22 mins   Crissie Reese OTR/L  04/16/2018, 2:33 PM

## 2018-04-17 ENCOUNTER — Other Ambulatory Visit: Payer: Self-pay

## 2018-04-17 ENCOUNTER — Other Ambulatory Visit: Payer: Self-pay | Admitting: Registered Nurse

## 2018-04-17 ENCOUNTER — Encounter (HOSPITAL_COMMUNITY): Payer: Self-pay | Admitting: *Deleted

## 2018-04-17 ENCOUNTER — Inpatient Hospital Stay (HOSPITAL_COMMUNITY)
Admission: AD | Admit: 2018-04-17 | Discharge: 2018-04-22 | DRG: 885 | Disposition: A | Discharge status: Home / Self Care | Payer: PRIVATE HEALTH INSURANCE | Source: Intra-hospital | Attending: Psychiatry | Admitting: Psychiatry

## 2018-04-17 DIAGNOSIS — F4310 Post-traumatic stress disorder, unspecified: Secondary | ICD-10-CM | POA: Diagnosis present

## 2018-04-17 DIAGNOSIS — F1120 Opioid dependence, uncomplicated: Secondary | ICD-10-CM | POA: Diagnosis not present

## 2018-04-17 DIAGNOSIS — Z811 Family history of alcohol abuse and dependence: Secondary | ICD-10-CM

## 2018-04-17 DIAGNOSIS — G4700 Insomnia, unspecified: Secondary | ICD-10-CM | POA: Diagnosis present

## 2018-04-17 DIAGNOSIS — F419 Anxiety disorder, unspecified: Secondary | ICD-10-CM | POA: Diagnosis present

## 2018-04-17 DIAGNOSIS — A6009 Herpesviral infection of other urogenital tract: Secondary | ICD-10-CM

## 2018-04-17 DIAGNOSIS — F1210 Cannabis abuse, uncomplicated: Secondary | ICD-10-CM | POA: Diagnosis present

## 2018-04-17 DIAGNOSIS — F332 Major depressive disorder, recurrent severe without psychotic features: Secondary | ICD-10-CM | POA: Diagnosis present

## 2018-04-17 DIAGNOSIS — Z91040 Latex allergy status: Secondary | ICD-10-CM | POA: Diagnosis not present

## 2018-04-17 DIAGNOSIS — Z8249 Family history of ischemic heart disease and other diseases of the circulatory system: Secondary | ICD-10-CM | POA: Diagnosis not present

## 2018-04-17 DIAGNOSIS — Z793 Long term (current) use of hormonal contraceptives: Secondary | ICD-10-CM | POA: Diagnosis not present

## 2018-04-17 DIAGNOSIS — G8929 Other chronic pain: Secondary | ICD-10-CM | POA: Diagnosis present

## 2018-04-17 DIAGNOSIS — T1491XA Suicide attempt, initial encounter: Secondary | ICD-10-CM | POA: Diagnosis not present

## 2018-04-17 DIAGNOSIS — Z79899 Other long term (current) drug therapy: Secondary | ICD-10-CM

## 2018-04-17 DIAGNOSIS — Z62810 Personal history of physical and sexual abuse in childhood: Secondary | ICD-10-CM | POA: Diagnosis present

## 2018-04-17 DIAGNOSIS — Z8349 Family history of other endocrine, nutritional and metabolic diseases: Secondary | ICD-10-CM | POA: Diagnosis not present

## 2018-04-17 DIAGNOSIS — F1910 Other psychoactive substance abuse, uncomplicated: Secondary | ICD-10-CM | POA: Diagnosis present

## 2018-04-17 DIAGNOSIS — R45851 Suicidal ideations: Secondary | ICD-10-CM | POA: Diagnosis present

## 2018-04-17 DIAGNOSIS — F322 Major depressive disorder, single episode, severe without psychotic features: Secondary | ICD-10-CM | POA: Diagnosis present

## 2018-04-17 DIAGNOSIS — F1320 Sedative, hypnotic or anxiolytic dependence, uncomplicated: Secondary | ICD-10-CM | POA: Diagnosis present

## 2018-04-17 DIAGNOSIS — A6000 Herpesviral infection of urogenital system, unspecified: Secondary | ICD-10-CM | POA: Diagnosis present

## 2018-04-17 DIAGNOSIS — F603 Borderline personality disorder: Secondary | ICD-10-CM | POA: Diagnosis present

## 2018-04-17 DIAGNOSIS — Z813 Family history of other psychoactive substance abuse and dependence: Secondary | ICD-10-CM | POA: Diagnosis not present

## 2018-04-17 DIAGNOSIS — F17210 Nicotine dependence, cigarettes, uncomplicated: Secondary | ICD-10-CM | POA: Diagnosis present

## 2018-04-17 DIAGNOSIS — J189 Pneumonia, unspecified organism: Secondary | ICD-10-CM

## 2018-04-17 DIAGNOSIS — Z915 Personal history of self-harm: Secondary | ICD-10-CM

## 2018-04-17 MED ORDER — CLONIDINE HCL 0.1 MG PO TABS
0.1000 mg | ORAL_TABLET | Freq: Four times a day (QID) | ORAL | Status: DC
  Administered 2018-04-17 – 2018-04-18 (×3): 0.1 mg via ORAL
  Filled 2018-04-17 (×11): qty 1

## 2018-04-17 MED ORDER — METOPROLOL TARTRATE 25 MG PO TABS
12.5000 mg | ORAL_TABLET | Freq: Two times a day (BID) | ORAL | Status: DC
  Administered 2018-04-17 – 2018-04-22 (×10): 12.5 mg via ORAL
  Filled 2018-04-17 (×5): qty 1
  Filled 2018-04-17 (×2): qty 7
  Filled 2018-04-17 (×7): qty 1

## 2018-04-17 MED ORDER — OXYCODONE HCL 5 MG PO TABS
10.0000 mg | ORAL_TABLET | Freq: Once | ORAL | Status: AC
  Administered 2018-04-17: 10 mg via ORAL
  Filled 2018-04-17: qty 2

## 2018-04-17 MED ORDER — METOPROLOL TARTRATE 25 MG PO TABS: 12.5000 mg | ORAL_TABLET | Freq: Two times a day (BID) | ORAL | Status: DC

## 2018-04-17 MED ORDER — QUETIAPINE FUMARATE 50 MG PO TABS: 50.0000 mg | ORAL_TABLET | Freq: Three times a day (TID) | ORAL | Status: DC | PRN

## 2018-04-17 MED ORDER — CLONIDINE HCL 0.1 MG PO TABS
0.1000 mg | ORAL_TABLET | ORAL | Status: DC
  Filled 2018-04-17: qty 1

## 2018-04-17 MED ORDER — ONDANSETRON 4 MG PO TBDP
4.0000 mg | ORAL_TABLET | Freq: Four times a day (QID) | ORAL | Status: DC | PRN
  Administered 2018-04-17 – 2018-04-21 (×8): 4 mg via ORAL
  Filled 2018-04-17 (×10): qty 1

## 2018-04-17 MED ORDER — CLONIDINE 0.3 MG/24HR TD PTWK: 0.3000 mg | MEDICATED_PATCH | TRANSDERMAL | 0 refills | Status: DC

## 2018-04-17 MED ORDER — QUETIAPINE FUMARATE 300 MG PO TABS
300.0000 mg | ORAL_TABLET | Freq: Two times a day (BID) | ORAL | Status: DC
  Administered 2018-04-17 – 2018-04-22 (×10): 300 mg via ORAL
  Filled 2018-04-17 (×5): qty 1
  Filled 2018-04-17: qty 14
  Filled 2018-04-17 (×2): qty 1
  Filled 2018-04-17: qty 14
  Filled 2018-04-17 (×7): qty 1

## 2018-04-17 MED ORDER — CLONAZEPAM 1 MG PO TABS: 1.0000 mg | ORAL_TABLET | Freq: Two times a day (BID) | ORAL | 0 refills | Status: DC

## 2018-04-17 MED ORDER — HYDROXYZINE HCL 25 MG PO TABS
25.0000 mg | ORAL_TABLET | Freq: Four times a day (QID) | ORAL | Status: DC | PRN
  Administered 2018-04-17 – 2018-04-22 (×10): 25 mg via ORAL
  Filled 2018-04-17: qty 1
  Filled 2018-04-17: qty 10
  Filled 2018-04-17 (×7): qty 1

## 2018-04-17 MED ORDER — METHOCARBAMOL 500 MG PO TABS
500.0000 mg | ORAL_TABLET | Freq: Three times a day (TID) | ORAL | Status: DC | PRN
  Administered 2018-04-18 – 2018-04-21 (×8): 500 mg via ORAL
  Filled 2018-04-17 (×7): qty 1

## 2018-04-17 MED ORDER — GABAPENTIN 400 MG PO CAPS
400.0000 mg | ORAL_CAPSULE | Freq: Two times a day (BID) | ORAL | Status: DC
  Administered 2018-04-17 – 2018-04-22 (×10): 400 mg via ORAL
  Filled 2018-04-17 (×3): qty 1
  Filled 2018-04-17: qty 14
  Filled 2018-04-17 (×4): qty 1
  Filled 2018-04-17: qty 14
  Filled 2018-04-17 (×6): qty 1

## 2018-04-17 MED ORDER — LOPERAMIDE HCL 2 MG PO CAPS: 2.0000 mg | ORAL_CAPSULE | ORAL | Status: DC | PRN

## 2018-04-17 MED ORDER — HYDROXYZINE HCL 25 MG PO TABS: 25.0000 mg | ORAL_TABLET | ORAL | Status: DC | PRN

## 2018-04-17 MED ORDER — TRAZODONE HCL 50 MG PO TABS
50.0000 mg | ORAL_TABLET | Freq: Every evening | ORAL | Status: DC | PRN
  Administered 2018-04-17 – 2018-04-21 (×5): 50 mg via ORAL
  Filled 2018-04-17 (×2): qty 1
  Filled 2018-04-17: qty 7

## 2018-04-17 MED ORDER — MAGNESIUM HYDROXIDE 400 MG/5ML PO SUSP: 30.0000 mL | Freq: Every day | ORAL | Status: DC | PRN

## 2018-04-17 MED ORDER — BISACODYL 10 MG RE SUPP
10.0000 mg | Freq: Every day | RECTAL | Status: DC | PRN
  Administered 2018-04-17: 10 mg via RECTAL
  Filled 2018-04-17: qty 1

## 2018-04-17 MED ORDER — ACETAMINOPHEN 325 MG PO TABS
650.0000 mg | ORAL_TABLET | Freq: Four times a day (QID) | ORAL | Status: DC | PRN
  Administered 2018-04-17 – 2018-04-20 (×8): 650 mg via ORAL
  Filled 2018-04-17 (×8): qty 2

## 2018-04-17 MED ORDER — CLONIDINE HCL 0.1 MG PO TABS: 0.1000 mg | ORAL_TABLET | Freq: Every day | ORAL | Status: DC

## 2018-04-17 MED ORDER — OXYCODONE HCL 5 MG PO TABS
5.0000 mg | ORAL_TABLET | Freq: Four times a day (QID) | ORAL | Status: DC | PRN
  Administered 2018-04-18 – 2018-04-19 (×3): 5 mg via ORAL
  Filled 2018-04-17 (×3): qty 1

## 2018-04-17 MED ORDER — SENNOSIDES-DOCUSATE SODIUM 8.6-50 MG PO TABS: 2.0000 | ORAL_TABLET | Freq: Two times a day (BID) | ORAL | Status: DC

## 2018-04-17 MED ORDER — DICYCLOMINE HCL 20 MG PO TABS
20.0000 mg | ORAL_TABLET | Freq: Four times a day (QID) | ORAL | Status: DC | PRN
  Administered 2018-04-18: 20 mg via ORAL
  Filled 2018-04-17: qty 1

## 2018-04-17 MED ORDER — POLYETHYLENE GLYCOL 3350 17 G PO PACK: 17.0000 g | PACK | Freq: Two times a day (BID) | ORAL | 0 refills | Status: DC

## 2018-04-17 MED ORDER — SERTRALINE HCL 50 MG PO TABS: 50.0000 mg | ORAL_TABLET | Freq: Every day | ORAL | Status: DC

## 2018-04-17 MED ORDER — SERTRALINE HCL 50 MG PO TABS
50.0000 mg | ORAL_TABLET | Freq: Every day | ORAL | Status: DC
  Administered 2018-04-18 – 2018-04-22 (×5): 50 mg via ORAL
  Filled 2018-04-17 (×7): qty 1
  Filled 2018-04-17: qty 7

## 2018-04-17 MED ORDER — ALUM & MAG HYDROXIDE-SIMETH 200-200-20 MG/5ML PO SUSP
30.0000 mL | ORAL | Status: DC | PRN
  Administered 2018-04-18: 30 mL via ORAL
  Filled 2018-04-17: qty 30

## 2018-04-17 MED ORDER — NAPROXEN 500 MG PO TABS: 500.0000 mg | ORAL_TABLET | Freq: Two times a day (BID) | ORAL | Status: DC | PRN

## 2018-04-17 MED ORDER — QUETIAPINE FUMARATE 300 MG PO TABS: 300.0000 mg | ORAL_TABLET | Freq: Two times a day (BID) | ORAL | Status: DC

## 2018-04-17 MED ORDER — HYDROXYZINE HCL 25 MG PO TABS: 25.0000 mg | ORAL_TABLET | Freq: Three times a day (TID) | ORAL | 0 refills | Status: DC

## 2018-04-17 MED ORDER — VITAMIN B-1 100 MG PO TABS
100.0000 mg | ORAL_TABLET | Freq: Every day | ORAL | Status: DC
  Administered 2018-04-18 – 2018-04-21 (×4): 100 mg via ORAL
  Filled 2018-04-17 (×8): qty 1

## 2018-04-17 MED ORDER — BISACODYL 10 MG RE SUPP: 10.0000 mg | Freq: Every day | RECTAL | 0 refills | Status: DC | PRN

## 2018-04-17 MED ORDER — FOLIC ACID 1 MG PO TABS: 1.0000 mg | ORAL_TABLET | Freq: Every day | ORAL | Status: DC

## 2018-04-17 MED ORDER — MAGNESIUM CITRATE PO SOLN
1.0000 | Freq: Once | ORAL | Status: AC
  Administered 2018-04-17: 1 via ORAL

## 2018-04-17 MED ORDER — THIAMINE HCL 100 MG PO TABS: 100.0000 mg | ORAL_TABLET | Freq: Every day | ORAL | Status: DC

## 2018-04-17 MED ORDER — GABAPENTIN 400 MG PO CAPS: 400.0000 mg | ORAL_CAPSULE | Freq: Two times a day (BID) | ORAL | Status: DC

## 2018-04-17 MED ORDER — VALACYCLOVIR HCL 500 MG PO TABS
1000.0000 mg | ORAL_TABLET | Freq: Every day | ORAL | Status: DC
  Administered 2018-04-18 – 2018-04-22 (×5): 1000 mg via ORAL
  Filled 2018-04-17 (×6): qty 2
  Filled 2018-04-17: qty 14
  Filled 2018-04-17: qty 2

## 2018-04-17 MED ORDER — FOLIC ACID 1 MG PO TABS
1.0000 mg | ORAL_TABLET | Freq: Every day | ORAL | Status: DC
  Administered 2018-04-18 – 2018-04-21 (×4): 1 mg via ORAL
  Filled 2018-04-17 (×7): qty 1
  Filled 2018-04-17: qty 7

## 2018-04-17 MED ORDER — OXYCODONE HCL 10 MG PO TABS: 10.0000 mg | ORAL_TABLET | Freq: Four times a day (QID) | ORAL | 0 refills | Status: DC
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
## 2018-04-17 NOTE — Progress Notes (Signed)
Pt alert and oriented x4.  Denies and pain, SOB, N/V.  On suicide precautions and has a Comptroller at bedside for safety.  Complained of no BM, suppository given with good results.  Notified of bed in [facility] room 305-1.  Called report to [facility] RN.  Telemetry removed.  [facility] here for transport.  Ambulated for discharge with belongings.  Mom took all other belongings home.

## 2018-04-17 NOTE — Progress Notes (Signed)
Pt accepted to [facility], Bed 305-1 Shuvon Rankin, NP is the accepting provider.  Landry Mellow, MD is the attending provider.  Call report to [phone]  Marcelino Duster @ [facility] 2W notified. Bridgette Cobb, CSW for 2W Unit Pt is Voluntary.  Pt may be transported by [facility]  Pt scheduled  to arrive at [facility] @ 13:00  Carney Bern T. Kaylyn Lim, MSW, LCSWA Disposition Clinical Social Work [phone] (cell) [phone] (office)

## 2018-04-17 NOTE — Tx Team (Signed)
Initial Treatment Plan 04/17/2018 1:57 PM [name] QMV:[id number]    PATIENT STRESSORS: Marital or family conflict Medication change or noncompliance Substance abuse   PATIENT STRENGTHS: Ability for insight Average or above average intelligence Capable of independent living General fund of knowledge Motivation for treatment/growth Supportive family/friends   PATIENT IDENTIFIED PROBLEMS: Depression Suicidal thoughts "I'm an addict"                     DISCHARGE CRITERIA:  Ability to meet basic life and health needs Improved stabilization in mood, thinking, and/or behavior Reduction of life-threatening or endangering symptoms to within safe limits Verbal commitment to aftercare and medication compliance  PRELIMINARY DISCHARGE PLAN: Attend aftercare/continuing care group Return to previous living arrangement  PATIENT/FAMILY INVOLVEMENT: This treatment plan has been presented to and reviewed with the patient, Lindsay Lara, and/or family member, .  The patient and family have been given the opportunity to ask questions and make suggestions.  Jacob Cicero, [place], [place] 04/17/2018, 1:57 PM
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
## 2018-04-17 NOTE — Progress Notes (Signed)
  Speech Language Pathology Treatment: Cognitive-Linquistic  Patient Details Name: Lindsay Lara MRN: [id number] DOB: Aug 29, 2000 Today's Date: 04/17/2018 Time: 8366-2947 SLP Time Calculation (min) (ACUTE ONLY): 14 min  Assessment / Plan / Recommendation Clinical Impression  Pt's voice sounds subjectively louder although moderately hoarse. She uses pressure on her stoma dressing with Mod I, but it does sound like she has less air leaking than on previous date. Pt was lying on her side as she had recently been given a suppository, so PO trials were held. She denies any difficulty with PO intake. All education has been completed at this time - no further acute SLP needs identified. However, encouraged pt to f/u with ENT on an OP basis should her dysphonia not improve.    HPI HPI: 18 yr old female w/ PMHx ADHD, Anxiety, Bipolar depression, previous suicide attempts, Polysubstance abuse (cocaine, Marijuana, heroin and ETOH) presents via EMS after Mom found pt unresponsive. Bradycardic and hypotensive. Intubated 1/16-1/20, reintubated several hours later 1/20- 1/24 (self extubated), reintubated 1/24-1/29. CXR 1/28 Stable hardware positioning and bilateral pneumonia.  She has now recieved a trach - Shiley #6 cuffed      SLP Plan  All goals met       Recommendations  Diet recommendations: Regular;Thin liquid Liquids provided via: Cup;Straw Medication Administration: Whole meds with liquid Supervision: Patient able to self feed;Intermittent supervision to cue for compensatory strategies Compensations: Slow rate;Small sips/bites Postural Changes and/or Swallow Maneuvers: Seated upright 90 degrees                Oral Care Recommendations: Oral care BID Follow up Recommendations: None;Other (comment)(consider ENT referral if vocal quality remains hoarse) SLP Visit Diagnosis: Aphonia (R49.1) Plan: All goals met       GO                Venita Sheffield Vishnu Moeller 04/17/2018, 11:22 AM  Nuala Alpha, M.A. [facility] Environmental education officer [phone] Office [phone]

## 2018-04-17 NOTE — Progress Notes (Signed)
Lindsay Lara is a 18 year old female pt admitted on voluntary basis from medical floor. On admission, she reports that she took an overdose and her mother found her and got her to the hospital. She described her experience and spoke about how she was "dead for 4 minutes". She denies SI on admission and is able to contract for safety while in the hospital. She reports that she is feeling much better today. She reports some discomfort from her trach site but reports tolerating po meds and fluids. She reports that when she gets discharged she and her mother are going to move to [place] for a fresh start. [name] was escorted to the unit, oriented to the milieu and safety maintained.
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
## 2018-04-17 NOTE — Discharge Summary (Signed)
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 silhouette with normal heart size. Small apical left pneumothorax is stable. No right pneumothorax. Small bilateral pleural effusions, increased on the right and stable on the left. Worsening patchy bibasilar lung opacities. IMPRESSION: 1. Support structures as described. 2. Stable small left apical pneumothorax. 3. Small bilateral basilar pleural effusions, increased on the right and stable on the left. 4. Worsening patchy bibasilar lung opacities, which could represent atelectasis, aspiration or pneumonia. Electronically Signed   By: Delbert PhenixJason A Poff M.D.   On: 03/23/2018 07:09   Dg Chest Port 1 View  Result Date: 03/23/2018 CLINICAL DATA:  Status post bronchoscopy. EXAM: PORTABLE CHEST 1 VIEW COMPARISON:  Earlier this morning at 0100 hours. FINDINGS: 0238 hours. Endotracheal tube terminates 7.3 cm above carina. Nasogastric tube extends beyond the inferior aspect of the film. Left-sided subclavian line terminates at the low SVC. Left-sided chest tube is again identified. Interval decrease in the pleural air component of a left hydropneumothorax.  Visceral pleural line maximally 1.4 cm from chest wall currently versus 3.0 cm on the prior. Decreased left-sided pleural fluid component as well. Minimal mediastinal shift to the left. Normal heart size. Clear right lung. Left base atelectasis is improved. IMPRESSION: Overall improved left-sided aeration, with decreased hydropneumothorax and left base airspace disease. Electronically Signed   By: Jeronimo GreavesKyle  Talbot M.D.   On: 03/23/2018 03:12   Dg Chest Port 1 View  Result Date: 03/23/2018 CLINICAL DATA:  18 year old female with increased hypoxia and secretion. EXAM: PORTABLE CHEST 1 VIEW COMPARISON:  Earlier radiograph dated 03/22/2018 FINDINGS: The patient is rotated to the left. Endotracheal tube and left subclavian line again noted. An enteric tube is partially seen. Left-sided pigtail chest tube is in similar position. There is interval increase in the size of the left-sided pneumothorax with near complete collapse of the left lung and probable development of left-sided pleural effusion or hemothorax. There is overall decreased volume in the left hemithorax with leftward shift of the mediastinum. The pneumothorax now measures approximately 3 cm in width. The right lung is clear. IMPRESSION: Interval increase in the size of the left-sided pneumothorax with near complete collapse of the left lung and probable development of left-sided pleural effusion or hemothorax. These results were called by telephone at the time of interpretation  on 03/23/2018 at 1:57 am to nurse [facility] who verbally acknowledged these results. Electronically Signed   By: Elgie Collard M.D.   On: 03/23/2018 02:04   Dg Chest Port 1 View  Result Date: 03/22/2018 CLINICAL DATA:  Pneumothorax, chest tube EXAM: PORTABLE CHEST 1 VIEW COMPARISON:  03/22/2018 FINDINGS: Interval placement of left chest tube with near complete re-expansion of the left lung. Small residual left apical pneumothorax. Left chest tube, endotracheal tube and NG tube are  unchanged. Lungs clear. Heart is normal size. No effusions. IMPRESSION: Interval placement of left chest tube with near complete re-expansion of left lung. Small residual left apical pneumothorax. Electronically Signed   By: Charlett Nose M.D.   On: 03/22/2018 09:54   Dg Chest Port 1 View  Result Date: 03/22/2018 CLINICAL DATA:  Hypoxia.  Pneumothorax. EXAM: PORTABLE CHEST 1 VIEW COMPARISON:  September 19, 2018 FINDINGS: Endotracheal tube tip is 3.2 cm above the carina. Nasogastric tube tip and side port are below the diaphragm. Central catheter tip is in the superior vena cava. Pneumothorax on the left is considerably larger, seen in the apical and lateral regions. No tension component. Lungs are clear. Heart size and pulmonary vascularity normal. No adenopathy. No bone lesions. IMPRESSION: Considerably larger pneumothorax on the left without frank tension component. Tube and catheter positions as described without pneumothorax. No edema or consolidation. Critical Value/emergent results were called by telephone at the time of interpretation on 03/22/2018 at 8:30 am to [facility], NP, who verbally acknowledged these results. Electronically Signed   By: Bretta Bang III M.D.   On: 03/22/2018 08:31   Dg Chest Portable 1 View  Result Date: 03/22/2018 CLINICAL DATA:  Central line placement EXAM: PORTABLE CHEST 1 VIEW COMPARISON:  03/21/2018 at 2119 hours FINDINGS: Endotracheal tube terminates 4 cm above the carina. Lungs are clear. No pleural effusion. Suspected trace left apical pneumothorax. Interval placement of a left subclavian venous catheter terminating at the cavoatrial junction. The heart is normal in size. Enteric tube courses into the stomach. IMPRESSION: Interval placement of left subclavian venous catheter terminating at the cavoatrial junction. Suspected trace left apical pneumothorax. Additional stable support apparatus as above. Electronically Signed   By: Charline Bills M.D.   On: 03/22/2018  00:00   Dg Chest Portable 1 View  Result Date: 03/21/2018 CLINICAL DATA:  18 year old female found unresponsive. Intubated. EXAM: PORTABLE CHEST 1 VIEW COMPARISON:  02/08/2018 and earlier. FINDINGS: Portable AP semi upright view at 2119 hours. Endotracheal tube tip is just above the level the clavicles. Enteric tube courses to the left upper quadrant at the site and the side hole is at the level of the gastric body. Lung volumes and mediastinal contours are normal. Allowing for portable technique the lungs are clear. No osseous abnormality identified. Paucity of bowel gas. IMPRESSION: 1. ETT tip just above the clavicles, could be advanced 1 cm for more optimal placement. Enteric tube position satisfactory. 2. No cardiopulmonary abnormality. Electronically Signed   By: Odessa Fleming M.D.   On: 03/21/2018 21:31   Dg Abd Portable 1v  Result Date: 04/07/2018 CLINICAL DATA:  Evaluate feeding tube position placed yesterday. EXAM: PORTABLE ABDOMEN - 1 VIEW COMPARISON:  04/05/2018 FINDINGS: Feeding tube tip projected over the mid abdomen consistent with location in the third portion of the duodenum. Mildly gas distended stomach. Gas-filled small and large bowel without abnormal distention. Tracheostomy present. Left central venous catheter with tip over the lower SVC region. No visible pneumothorax. IMPRESSION: Feeding tube tip projected  over the mid abdomen consistent with location in the third portion of the duodenum. Electronically Signed   By: Burman Nieves M.D.   On: 04/07/2018 21:37   Dg Abd Portable 1v  Result Date: 04/05/2018 CLINICAL DATA:  18 year old female EXAM: PORTABLE ABDOMEN - 1 VIEW COMPARISON:  None. FINDINGS: Metallic tip enteric feeding tube terminates in the region of the 2/3 portion of duodenum. Gas within stomach and small bowel.  No abnormal distention. Unremarkable musculoskeletal structures. IMPRESSION: Metallic tip enteric feeding tube appears to terminate in the 2/3 portion of the  duodenum. Electronically Signed   By: Gilmer Mor D.O.   On: 04/05/2018 12:49   Dg Abd Portable 1v  Result Date: 03/25/2018 CLINICAL DATA:  OG tube placement EXAM: PORTABLE ABDOMEN - 1 VIEW COMPARISON:  03/24/2018 FINDINGS: Enteric tube tip and side port projected the expected location of the mid body of the stomach. Paucity of bowel gas without evidence of enteric obstruction Limited visualization of lower thorax demonstrates extensive bilateral heterogeneous airspace opacities worrisome for multifocal infection. Chest tube overlies the peripheral aspect of the left mid lung. No acute osseous abnormalities. IMPRESSION: 1. Enteric tube tip and side port projected the expected location of the mid body of the stomach. 2. Extensive bilateral heterogeneous airspace opacities within the imaged lung bases worrisome for multifocal infection. Electronically Signed   By: Simonne Come M.D.   On: 03/25/2018 14:08   Dg Swallowing Func-speech Pathology  Result Date: 04/09/2018 Objective Swallowing Evaluation: Type of Study: MBS-Modified Barium Swallow Study  Patient Details Name: STORMY CONNON MRN: [id number] Date of Birth: Feb 24, 2001 Today's Date: 04/09/2018 Time: SLP Start Time (ACUTE ONLY): 1419 -SLP Stop Time (ACUTE ONLY): 1445 SLP Time Calculation (min) (ACUTE ONLY): 26 min Past Medical History: Past Medical History: Diagnosis Date . Acne  . ADHD (attention deficit hyperactivity disorder) 05/08/2012 . Anxiety  . Bipolar and related disorder (HCC) 09/18/2015 . Depression  . 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 Pneumonia at age 15 . Polysubstance abuse (HCC) 09/18/2015 . Scoliosis no treatment required . Substance induced mood disorder (HCC) 09/18/2015 Past Surgical History: No past surgical history on file. HPI: 18 yr old female w/ PMHx ADHD, Anxiety, Bipolar depression, previous suicide attempts, Polysubstance abuse (cocaine, Marijuana, heroin and ETOH) presents via EMS after Mom found pt unresponsive.  Bradycardic and hypotensive. Intubated 1/16-1/20, reintubated several hours later 1/20- 1/24 (self extubated), reintubated 1/24-1/29. CXR 1/28 Stable hardware positioning and bilateral pneumonia.  She has now recieved a trach - Shiley #6 cuffed  Subjective: pt is eager for POs Assessment / Plan / Recommendation CHL IP CLINICAL IMPRESSIONS 04/09/2018 Clinical Impression Pt's oropharyngeal swallow is WFL despite open trach given inability to wear PMV. No aspiration is observed, her swallow is timely, and she leaves trace to no residuals behind in her pharynx. Pt was not observed swallowing a barium tablet as she had a delayed cough and subjective c/o nausea (esophagus scanned without overt findings). Pt would be appropriate to start up to a regular diet with thin liquids, with careful adherence to aspiration precautions as she is not yet wearing her PMV. Given report of nausea, RN planned to speak with MD about if this diet can be started or if a clear liquid diet is preferred. SLP will f/u for PO tolerance and additional PMV trials. SLP Visit Diagnosis Dysphagia, unspecified (R13.10) Attention and concentration deficit following -- Frontal lobe and executive function deficit following -- Impact on safety and function Mild aspiration risk  CHL IP TREATMENT RECOMMENDATION 04/09/2018 Treatment Recommendations Therapy as outlined in treatment plan below   Prognosis 04/09/2018 Prognosis for Safe Diet Advancement Good Barriers to Reach Goals -- Barriers/Prognosis Comment -- CHL IP DIET RECOMMENDATION 04/09/2018 SLP Diet Recommendations Regular solids;Thin liquid Liquid Administration via Cup;Straw Medication Administration Whole meds with liquid Compensations Slow rate;Small sips/bites Postural Changes Seated upright at 90 degrees   CHL IP OTHER RECOMMENDATIONS 04/09/2018 Recommended Consults -- Oral Care Recommendations Oral care BID Other Recommendations --   CHL IP FOLLOW UP RECOMMENDATIONS 04/09/2018 Follow up Recommendations  Inpatient Rehab   CHL IP FREQUENCY AND DURATION 04/09/2018 Speech Therapy Frequency (ACUTE ONLY) min 2x/week Treatment Duration 2 weeks      CHL IP ORAL PHASE 04/09/2018 Oral Phase WFL Oral - Pudding Teaspoon -- Oral - Pudding Cup -- Oral - Honey Teaspoon -- Oral - Honey Cup -- Oral - Nectar Teaspoon -- Oral - Nectar Cup -- Oral - Nectar Straw -- Oral - Thin Teaspoon -- Oral - Thin Cup -- Oral - Thin Straw -- Oral - Puree -- Oral - Mech Soft -- Oral - Regular -- Oral - Multi-Consistency -- Oral - Pill -- Oral Phase - Comment --  CHL IP PHARYNGEAL PHASE 04/09/2018 Pharyngeal Phase WFL Pharyngeal- Pudding Teaspoon -- Pharyngeal -- Pharyngeal- Pudding Cup -- Pharyngeal -- Pharyngeal- Honey Teaspoon -- Pharyngeal -- Pharyngeal- Honey Cup -- Pharyngeal -- Pharyngeal- Nectar Teaspoon -- Pharyngeal -- Pharyngeal- Nectar Cup -- Pharyngeal -- Pharyngeal- Nectar Straw -- Pharyngeal -- Pharyngeal- Thin Teaspoon -- Pharyngeal -- Pharyngeal- Thin Cup -- Pharyngeal -- Pharyngeal- Thin Straw -- Pharyngeal -- Pharyngeal- Puree -- Pharyngeal -- Pharyngeal- Mechanical Soft -- Pharyngeal -- Pharyngeal- Regular -- Pharyngeal -- Pharyngeal- Multi-consistency -- Pharyngeal -- Pharyngeal- Pill -- Pharyngeal -- Pharyngeal Comment --  CHL IP CERVICAL ESOPHAGEAL PHASE 04/09/2018 Cervical Esophageal Phase WFL Pudding Teaspoon -- Pudding Cup -- Honey Teaspoon -- Honey Cup -- Nectar Teaspoon -- Nectar Cup -- Nectar Straw -- Thin Teaspoon -- Thin Cup -- Thin Straw -- Puree -- Mechanical Soft -- Regular -- Multi-consistency -- Pill -- Cervical Esophageal Comment -- [name] 04/09/2018, 3:43 PM  [name] Nix, M.A. CCC-SLP Acute Rehabilitation Services Pager [phone] Office [phone]             Vas [place] Upper Extremity Venous Duplex  Result Date: 03/24/2018 UPPER VENOUS STUDY  Indications: Edema Comparison Study: No prior study on file Performing Technologist: Sherren Kerns RVS  Examination Guidelines: A complete evaluation includes  B-mode imaging, spectral Doppler, color Doppler, and power Doppler as needed of all accessible portions of each vessel. Bilateral testing is considered an integral part of a complete examination. Limited examinations for reoccurring indications may be performed as noted.  Right Findings: +----------+------------+----------+---------+-----------+-------+ RIGHT     CompressiblePropertiesPhasicitySpontaneousSummary +----------+------------+----------+---------+-----------+-------+ IJV           Full                 Yes       Yes            +----------+------------+----------+---------+-----------+-------+ Subclavian                         Yes       Yes            +----------+------------+----------+---------+-----------+-------+ Axillary      Full                 Yes       Yes            +----------+------------+----------+---------+-----------+-------+  Brachial      Full                                          +----------+------------+----------+---------+-----------+-------+ Radial        Full                                          +----------+------------+----------+---------+-----------+-------+ Ulnar         Full                                          +----------+------------+----------+---------+-----------+-------+ Cephalic      Full                                          +----------+------------+----------+---------+-----------+-------+ Basilic       Full                                          +----------+------------+----------+---------+-----------+-------+  Left Findings: +----------+------------+----------+---------+-----------+-------+ LEFT      CompressiblePropertiesPhasicitySpontaneousSummary +----------+------------+----------+---------+-----------+-------+ Subclavian                         Yes       Yes            +----------+------------+----------+---------+-----------+-------+  Summary:  Right: No evidence of  deep vein thrombosis in the upper extremity. No evidence of superficial vein thrombosis in the upper extremity. Interstitial fluid noted throughout.  Left: No evidence of thrombosis in the subclavian.  *See table(s) above for measurements and observations.  Diagnosing physician: Coral ElseVance Brabham MD Electronically signed by Coral ElseVance Brabham MD on 03/24/2018 at 4:58:21 PM.    Final    EEG 1/17 EEG Abnormalities: 1) right posterior quadrant slow activity 2) excess beta activity  Clinical Interpretation: This EEG is suggestive of a focal cerebral dysfunction in the right posterior quadrant.  Though there is a possibility that this could be artifactual in nature, I would recommend correlation with imaging studies, preferably MRI.    There was no seizure or evidence of seizure predisposition recorded on this study. Please note that lack of epileptiform activity on EEG does not preclude the possibility of epilepsy.    EEG 1/23 EEG Abnormalities: None  Clinical Interpretation: This normal EEG is recorded in the waking and drowsy state. There was no seizure or seizure predisposition recorded on this study. Please note that lack of epileptiform activity on EEG does not preclude the possibility of epilepsy.    Echo 1/28 IMPRESSIONS    1. The left ventricle appears to be normal in size, has normal wall thickness 55-60% ejection fraction Spectral Doppler shows normal pattern of diastolic filling.  2. Right ventricular systolic pressure could not be assessed.  3. The right ventricle has moderately enlarged size and moderately reduced systolic function.  4. Normal left atrial size.  5. Normal right atrial size.  6. The mitral valve normal in structure and function.  7. Normal tricuspid valve.  8. Aortic  valve normal.  9. No atrial level shunt detected by color flow Doppler.   EEG 2/3 IMPRESSION: 1.  There is multiple episodes of brief frontal and generalized intermittent rhythmic delta activity.   These are typically seen in toxic metabolic encephalopathies. 2.  Few episodes of triphasic waves typically seen in encephalopathies due to renal or hepatic impairment.     Discharge Exam: Vitals:   04/17/18 0732 04/17/18 0755  BP: 93/66   Pulse: 72   Resp: 18   Temp:  98.1 F (36.7 C)  SpO2: 98%     General: Pt is alert, awake, not in acute distress Cardiovascular: RRR, S1/S2 +, no rubs, no gallops Respiratory: CTA bilaterally, no wheezing, no rhonchi Abdominal: Soft, NT, ND, bowel sounds + Extremities: no edema, no cyanosis    The results of significant diagnostics from this hospitalization (including imaging, microbiology, ancillary and laboratory) are listed below for reference.     Microbiology: No results found for this or any previous visit (from the past 240 hour(s)).   Labs: BNP (last 3 results) No results for input(s): BNP in the last 8760 hours. Basic Metabolic Panel: Recent Labs  Lab 04/16/18 0233  NA 138  K 4.1  CL 101  CO2 26  GLUCOSE 98  BUN 8  CREATININE 0.63  CALCIUM 9.6   Liver Function Tests: No results for input(s): AST, ALT, ALKPHOS, BILITOT, PROT, ALBUMIN in the last 168 hours. No results for input(s): LIPASE, AMYLASE in the last 168 hours. No results for input(s): AMMONIA in the last 168 hours. CBC: Recent Labs  Lab 04/16/18 0233  WBC 8.5  HGB 11.4*  HCT 35.8*  MCV 92.5  PLT 491*   Cardiac Enzymes: No results for input(s): CKTOTAL, CKMB, CKMBINDEX, TROPONINI in the last 168 hours. BNP: Invalid input(s): POCBNP CBG: Recent Labs  Lab 04/10/18 1851 04/10/18 2326 04/11/18 1146 04/11/18 1824 04/12/18 0007  GLUCAP 94 96 84 95 86   D-Dimer No results for input(s): DDIMER in the last 72 hours. Hgb A1c No results for input(s): HGBA1C in the last 72 hours. Lipid Profile No results for input(s): CHOL, HDL, LDLCALC, TRIG, CHOLHDL, LDLDIRECT in the last 72 hours. Thyroid function studies No results for input(s): TSH, T4TOTAL,  T3FREE, THYROIDAB in the last 72 hours.  Invalid input(s): FREET3 Anemia work up No results for input(s): VITAMINB12, FOLATE, FERRITIN, TIBC, IRON, RETICCTPCT in the last 72 hours. Urinalysis    Component Value Date/Time   COLORURINE YELLOW 03/21/2018 2200   APPEARANCEUR CLEAR 03/21/2018 2200   LABSPEC 1.013 03/21/2018 2200   PHURINE 6.0 03/21/2018 2200   GLUCOSEU NEGATIVE 03/21/2018 2200   HGBUR NEGATIVE 03/21/2018 2200   BILIRUBINUR NEGATIVE 03/21/2018 2200   BILIRUBINUR neg 11/16/2015 1326   KETONESUR 20 (A) 03/21/2018 2200   PROTEINUR 30 (A) 03/21/2018 2200   UROBILINOGEN negative 11/16/2015 1326   UROBILINOGEN 0.2 11/11/2013 1016   NITRITE NEGATIVE 03/21/2018 2200   LEUKOCYTESUR NEGATIVE 03/21/2018 2200   Sepsis Labs Invalid input(s): PROCALCITONIN,  WBC,  LACTICIDVEN Microbiology No results found for this or any previous visit (from the past 240 hour(s)).   Patient was seen and examined on the day of discharge and was found to be in stable condition. Time coordinating discharge: 45 minutes including assessment and coordination of care, as well as examination of the patient.   SIGNED:  Noralee Stain, DO [facility] [email or web address] 04/17/2018, 9:46 AM

## 2018-04-17 NOTE — Clinical Social Work Note (Addendum)
Pt has a bed at [facility] today. MD updated. Voluntary form faxed to [facility]. Pt will transport by [facility] at 1:00 PM--transport set up. Pt will go to room 305 bed 1. Accepting provider will be [facility]. Accepting MD will be Dr. Jola Babinski. RN to call report to [phone]. Clinical Social Worker will sign off for now as social work intervention is no longer needed. Please consult [place] again if new need arises.    [place], [place] [phone]

## 2018-04-17 NOTE — Progress Notes (Signed)
Nursing Progress Note: 7p-7a D: Pt currently presents with a anxious/nervous affect and behavior. Pt states "I am worried about sleep. I take Seroquel before bed. It is really the only thing that helps me sleep." Interacting appropriately with the milieu. Pt reports good sleep during the previous night with current medication regimen. Pt did not attend wrap-up group.  A: Pt provided with medications per providers orders. Pt's labs and vitals were monitored throughout the night. Pt supported emotionally and encouraged to express concerns and questions. Pt educated on medications.  R: Pt's safety ensured with 15 minute and environmental checks. Pt currently denies SI, HI, and AVH. Pt verbally contracts to seek staff if SI,HI, or AVH occurs and to consult with staff before acting on any harmful thoughts. Will continue to monitor.

## 2018-04-17 NOTE — Plan of Care (Signed)
  Problem: Education: Goal: Knowledge of General Education information will improve Description Including pain rating scale, medication(s)/side effects and non-pharmacologic comfort measures Outcome: Progressing   Problem: Clinical Measurements: Goal: Respiratory complications will improve Outcome: Progressing   Problem: Nutrition: Goal: Adequate nutrition will be maintained Outcome: Progressing   Problem: Coping: Goal: Level of anxiety will decrease Outcome: Progressing   Problem: Pain Managment: Goal: General experience of comfort will improve Outcome: Progressing   Problem: Education: Goal: Knowledge of warning signs, risks, and behaviors that relate to suicide ideation and self-harm behaviors will improve Outcome: Progressing

## 2018-04-18 DIAGNOSIS — F322 Major depressive disorder, single episode, severe without psychotic features: Secondary | ICD-10-CM | POA: Diagnosis present

## 2018-04-18 MED ORDER — ENSURE ENLIVE PO LIQD
237.0000 mL | Freq: Two times a day (BID) | ORAL | Status: DC
  Administered 2018-04-19 – 2018-04-20 (×3): 237 mL via ORAL

## 2018-04-18 MED ORDER — IBUPROFEN 400 MG PO TABS
400.0000 mg | ORAL_TABLET | Freq: Four times a day (QID) | ORAL | Status: DC | PRN
  Administered 2018-04-18 – 2018-04-19 (×2): 400 mg via ORAL
  Filled 2018-04-18 (×2): qty 1

## 2018-04-18 NOTE — Progress Notes (Signed)
Pt was witnessed by tech entering a female patients room. Pt placed a note saying, "get a condom" on female pts pillow and left. Policy was reviewed. Will continue to monitor.

## 2018-04-18 NOTE — Plan of Care (Signed)
D: Patient presents demanding, asking for frequent PRN medication administration. She continues to complain that she is in withdrawal, but is scoring fairly low at 5-7 on COWS. Her BP has been running low, and she complains of dark urine. It seems like a combination of clonidine protocol and dehydration may be contributing to this. I encouraged PO fluids. Provider discontinued clonidine protocol. She slept well last night, and received medication that was helpful. Her appetite is good, energy normal and concentration good. She rates her depression and hopelessness 0/10 and anxiety 5/10. She complains of chills, agitation and irritability. On self-assessment she only rates her pain 1/10, but later told me her pain was between 7 and 9/10. She also complains of constipation, but is refusing milk of magnesia stating "it doesn't work." Patient denies SI/HI/AVH.  A: Patient checked q15 min, and checks reviewed. Reviewed medication changes with patient and educated on side effects. Educated patient on importance of attending group therapy sessions and educated on several coping skills. Encouarged participation in milieu through recreation therapy and attending meals with peers. Support and encouragement provided. Fluids offered. R: Patient receptive to education on medications, and is medication compliant. Patient contracts for safety on the unit. Goal: "My plan to stay sober for the long run."

## 2018-04-18 NOTE — Progress Notes (Signed)
[facility] MD Progress Note  04/18/2018 1:30 PM Lindsay Lara  MRN:  [id number] Subjective: Patient is seen and examined.  Patient is a 18 year old female with a past psychiatric history significant for polysubstance dependence including benzodiazepines as well as a previous past psychiatric history significant for bipolar disorder who originally presented to the [facility] emergency department on 03/21/2018 after an intentional overdose.  She was transferred to our facility on 04/17/2018.  Objective: Patient is seen and examined.  Patient is a 18 year old female with the above-stated past psychiatric history who is seen in follow-up.  She is remained in bed most the day today.  She stated that she is "in withdrawal".  She does have oxycodone available for her.  She has 5 mg p.o. every 6 hours as needed a CO WS greater than 12.  She has not fulfill the criteria.  She remains very medication seeking.  She stated that they were giving her Klonopin in the medical side of the hospital as well as the narcotics.  I discussed the fact that she had previously taken anywhere between 10 to 20 mg of benzodiazepines she was showing no withdrawal signs or symptoms of that.  We also discussed the fact that the opiate withdrawal is not life-threatening, and is well she was not fulfilling criteria for the medication oxycodone.  She still contends that she wants to do an intensive outpatient program in [place] when she and her mother go there.  I have expressed my concern that given the severity of her illness, the severity of the life-threatening overdose that she had that I believe she needs something more significant.  She stated that she had had a traumatic experience in a rehabilitation facility before, and she was frightened by that.  She stated she hurt in every bone in her body, and I reminded her that she has available for her the Robaxin, the hydroxyzine, the Bentyl, and the ibuprofen.  Her heart rate today is 74 and her  blood pressure is 90/49.  She slept 6.75 hours last night.  Principal Problem: <principal problem not specified> Diagnosis: Active Problems:   Opioid dependence (HCC)  Total Time spent with patient: 30 minutes  Past Psychiatric History: See admission H&P  Past Medical History:  Past Medical History:  Diagnosis Date  . Acne   . ADHD (attention deficit hyperactivity disorder) 05/08/2012  . Anxiety   . Bipolar and related disorder (HCC) 09/18/2015  . Depression   . 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 Pneumonia at age 47  . Polysubstance abuse (HCC) 09/18/2015  . Scoliosis no treatment required  . Substance induced mood disorder (HCC) 09/18/2015   History reviewed. No pertinent surgical history. Family History:  Family History  Problem Relation Age of Onset  . Alcoholism Father        and Paterna grandfather  . Heart disease Father   . Hyperlipidemia Father   . Drug abuse Paternal Uncle    Family Psychiatric  History: See admission H&P Social History:  Social History   Substance and Sexual Activity  Alcohol Use Yes   Comment: occasional     Social History   Substance and Sexual Activity  Drug Use Yes  . Types: Marijuana, Benzodiazepines, Cocaine   Comment: acid, xanax    Social History   Socioeconomic History  . Marital status: Single    Spouse name: Not on file  . Number of children: Not on file  . Years of education: Not  on file  . Highest education level: Not on file  Occupational History  . Not on file  Social Needs  . Financial resource strain: Not on file  . Food insecurity:    Worry: Not on file    Inability: Not on file  . Transportation needs:    Medical: Not on file    Non-medical: Not on file  Tobacco Use  . Smoking status: Current Every Day Smoker    Packs/day: 0.50    Types: Cigarettes    Last attempt to quit: 01/11/2016    Years since quitting: 2.2  . Smokeless tobacco: Never Used  Substance and Sexual Activity  . Alcohol  use: Yes    Comment: occasional  . Drug use: Yes    Types: Marijuana, Benzodiazepines, Cocaine    Comment: acid, xanax  . Sexual activity: Yes    Partners: Male    Birth control/protection: Pill  Lifestyle  . Physical activity:    Days per week: Not on file    Minutes per session: Not on file  . Stress: Not on file  Relationships  . Social connections:    Talks on phone: Not on file    Gets together: Not on file    Attends religious service: Not on file    Active member of club or organization: Not on file    Attends meetings of clubs or organizations: Not on file    Relationship status: Not on file  Other Topics Concern  . Not on file  Social History Narrative  . Not on file   Additional Social History:                         Sleep: Good  Appetite:  Fair  Current Medications: Current Facility-Administered Medications  Medication Dose Route Frequency Provider Last Rate Last Dose  . acetaminophen (TYLENOL) tablet 650 mg  650 mg Oral Q6H PRN Antonieta Pert, MD   650 mg at 04/18/18 0814  . alum & mag hydroxide-simeth (MAALOX/MYLANTA) 200-200-20 MG/5ML suspension 30 mL  30 mL Oral Q4H PRN Antonieta Pert, MD      . dicyclomine (BENTYL) tablet 20 mg  20 mg Oral Q6H PRN Antonieta Pert, MD      . folic acid (FOLVITE) tablet 1 mg  1 mg Oral Daily Antonieta Pert, MD   1 mg at 04/18/18 1607  . gabapentin (NEURONTIN) capsule 400 mg  400 mg Oral BID Antonieta Pert, MD   400 mg at 04/18/18 3710  . hydrOXYzine (ATARAX/VISTARIL) tablet 25 mg  25 mg Oral Q6H PRN Antonieta Pert, MD   25 mg at 04/18/18 1304  . ibuprofen (ADVIL,MOTRIN) tablet 400 mg  400 mg Oral Q6H PRN Antonieta Pert, MD   400 mg at 04/18/18 1304  . loperamide (IMODIUM) capsule 2-4 mg  2-4 mg Oral PRN Antonieta Pert, MD      . magnesium hydroxide (MILK OF MAGNESIA) suspension 30 mL  30 mL Oral Daily PRN Antonieta Pert, MD      . methocarbamol (ROBAXIN) tablet 500 mg  500 mg  Oral Q8H PRN Antonieta Pert, MD   500 mg at 04/18/18 0950  . metoprolol tartrate (LOPRESSOR) tablet 12.5 mg  12.5 mg Oral BID Antonieta Pert, MD   12.5 mg at 04/18/18 6269  . ondansetron (ZOFRAN-ODT) disintegrating tablet 4 mg  4 mg Oral Q6H PRN Antonieta Pert, MD   4  mg at 04/18/18 0812  . oxyCODONE (Oxy IR/ROXICODONE) immediate release tablet 5 mg  5 mg Oral Q6H PRN Antonieta Pertlary, Greg Lawson, MD      . QUEtiapine (SEROQUEL) tablet 300 mg  300 mg Oral BID Antonieta Pertlary, Greg Lawson, MD   300 mg at 04/18/18 [id number]  . sertraline (ZOLOFT) tablet 50 mg  50 mg Oral Daily Antonieta Pertlary, Greg Lawson, MD   50 mg at 04/18/18 [id number]  . thiamine (VITAMIN B-1) tablet 100 mg  100 mg Oral Daily Antonieta Pertlary, Greg Lawson, MD   100 mg at 04/18/18 [id number]  . traZODone (DESYREL) tablet 50 mg  50 mg Oral QHS PRN Antonieta Pertlary, Greg Lawson, MD   50 mg at 04/17/18 2132  . valACYclovir (VALTREX) tablet 1,000 mg  1,000 mg Oral Daily Antonieta Pertlary, Greg Lawson, MD   1,000 mg at 04/18/18 [id number]    Lab Results: No results found for this or any previous visit (from the past 48 hour(s)).  Blood Alcohol level:  Lab Results  Component Value Date   ETH <10 03/21/2018   ETH <10 02/07/2018    Metabolic Disorder Labs: Lab Results  Component Value Date   HGBA1C 5.0 02/10/2018   MPG 96.8 02/10/2018   MPG 103 09/17/2015   No results found for: PROLACTIN Lab Results  Component Value Date   CHOL 157 02/10/2018   TRIG 311 (H) 04/04/2018   HDL 47 02/10/2018   CHOLHDL 3.3 02/10/2018   VLDL 30 02/10/2018   LDLCALC 80 02/10/2018   LDLCALC 89 09/18/2015    Physical Findings: AIMS: Facial and Oral Movements Muscles of Facial Expression: None, normal Lips and Perioral Area: None, normal Jaw: None, normal Tongue: None, normal,Extremity Movements Upper (arms, wrists, hands, fingers): None, normal Lower (legs, knees, ankles, toes): None, normal, Trunk Movements Neck, shoulders, hips: None, normal, Overall Severity Severity of abnormal movements (highest score  from questions above): None, normal Incapacitation due to abnormal movements: None, normal Patient's awareness of abnormal movements (rate only patient's report): No Awareness, Dental Status Current problems with teeth and/or dentures?: No Does patient usually wear dentures?: No  CIWA:    COWS:  COWS Total Score: 6  Musculoskeletal: Strength & Muscle Tone: within normal limits Gait & Station: normal Patient leans: N/A  Psychiatric Specialty Exam: Physical Exam  Nursing note and vitals reviewed. Constitutional: She is oriented to person, place, and time. She appears well-developed and well-nourished.  HENT:  Head: Normocephalic and atraumatic.  Respiratory: Effort normal.  Neurological: She is alert and oriented to person, place, and time.    ROS  Blood pressure (!) 90/49, pulse 74, temperature 97.9 F (36.6 C), temperature source Oral, resp. rate 20, height 5\' 4"  (1.626 m), weight 48.1 kg, last menstrual period 03/21/2018.Body mass index is 18.19 kg/m.  General Appearance: Disheveled  Eye Contact:  Fair  Speech:  Normal Rate  Volume:  Decreased  Mood:  Dysphoric  Affect:  Congruent  Thought Process:  Coherent and Descriptions of Associations: Circumstantial  Orientation:  Full (Time, Place, and Person)  Thought Content:  Logical  Suicidal Thoughts:  No  Homicidal Thoughts:  No  Memory:  Immediate;   Fair Recent;   Fair Remote;   Fair  Judgement:  Impaired  Insight:  Lacking  Psychomotor Activity:  Psychomotor Retardation  Concentration:  Concentration: Fair and Attention Span: Fair  Recall:  [employer]  Fund of Knowledge:  Fair  Language:  Fair  Akathisia:  Negative  Handed:  Right  AIMS (if indicated):  Assets:  Desire for Improvement Financial Resources/Insurance Housing Resilience Social Support  ADL's:  Intact  Cognition:  WNL  Sleep:  Number of Hours: 6.75     Treatment Plan Summary: Daily contact with patient to assess and evaluate symptoms and  progress in treatment, Medication management and Plan :  Patient is seen and examined.  Patient is a 18 year old female with a past psychiatric history significant for opiate dependence, benzodiazepine dependence, posttraumatic stress disorder, borderline personality disorder as well as possible bipolar disorder who was originally admitted to the medical side of the hospital in January secondary to respiratory failure, developed ARDS, was intubated, required tracheostomy, and then transferred to our hospital after all these events hemodynamically she is stable at this time.  She is still very med seeking.  I have explained to her again the criteria to receive the narcotic pain medication.  Her blood pressure is low, and her heart rate is stable.  She does not fulfill the criteria at this point.  I reminded her that she has access to the Robaxin, Bentyl and other medications.  Given her low blood pressure I am going to hold the clonidine for now.  We will continue her Seroquel, Zoloft and other medications as previously written.  I have again reinforced with her that I believe she does need an inpatient subs abuse treatment program, but she disagrees with this, and so we will continue with our detox and get her in the best shape that we can prior to going to an intensive outpatient program. 1.  Continue Bentyl 20 mg p.o. every 6 hours as needed spasms and abdominal cramping for opiate withdrawal. 2.  Continue folic acid 1 mg p.o. daily for nutritional supplementation. 3.  Continue gabapentin 400 mg p.o. twice daily for mood stability and chronic pain. 4.  Continue hydroxyzine 25 mg p.o. every 6 hours as needed anxiety or nausea. 5.  Continue ibuprofen 400 mg p.o. every 6 hours as needed pain. 6.  Continue Robaxin 500 mg p.o. every 8 hours as needed muscle spasms. 7.  Continue Zofran 4 mg p.o. every 6 hours as needed nausea and vomiting. 8.  Continue oxycodone 5 mg p.o. every 6 hours as needed CO WS greater  than 12.  This will be DC'd tomorrow. 9.  Continue Seroquel 300 mg p.o. twice daily for mood stability. 10.  Continue sertraline 50 mg p.o. daily for depression and anxiety. 11.  Continue thiamine 100 mg p.o. daily for nutritional supplementation. 12.  Continue trazodone 50 mg p.o. nightly as needed insomnia. 13.  Continue Valtrex thousand milligrams p.o. daily for herpes suppression. 14.  Stop clonidine given low blood pressure. 15.  Disposition planning-in progress.  Antonieta Pert, MD 04/18/2018, 1:30 PM

## 2018-04-18 NOTE — Progress Notes (Signed)
[facility] Group Notes:  (Nursing/MHT/Case Management/Adjunct)  Date:  04/18/2018  Time:  2045  Type of Therapy:  wrap up group  Participation Level:  Active  Participation Quality:  Appropriate, Attentive, Sharing and Supportive  Affect:  Depressed and Flat  Cognitive:  Appropriate  Insight:  Lacking  Engagement in Group:  Engaged  Modes of Intervention:  Clarification, Education and Support  Summary of Progress/Problems: Pt reported having a good visit with her parents. If pt could change any one thing about her life it would be her mental illness. Pt is grateful for her family, parents, uncle, and dog. Pt shared she has a brother but they do not have a relationship.   Lindsay Lara S 04/18/2018, 10:06 PM

## 2018-04-18 NOTE — Progress Notes (Signed)
Nursing Progress Note: 7p-7a D: Pt currently presents with a needy/somatic/anxious/concrete affect and behavior. Pt states "I need more meds for pain. I need them. You don't understand. I was on oxy and dilaudid, and fentanyl. I am withdrawing so bad. I am sore and constipated." Interacting appropriately with the milieu. Pt reports good sleep during the previous night with current medication regimen. Pt did attend wrap-up group.  A: Pt provided with medications per providers orders. Pt's labs and vitals were monitored throughout the night. Pt supported emotionally and encouraged to express concerns and questions. Pt educated on medications.  R: Pt's safety ensured with 15 minute and environmental checks. Pt currently denies SI, HI, and AVH. Pt verbally contracts to seek staff if SI,HI, or AVH occurs and to consult with staff before acting on any harmful thoughts. Will continue to monitor.

## 2018-04-18 NOTE — [facility] Suicide Risk Assessment (Addendum)
[facility] INPATIENT:  Family/Significant Other Suicide Prevention Education  Suicide Prevention Education:  Education Completed; mother, Lindsay Lara [phone] has been identified by the patient as the family member/significant other with whom the patient will be residing, and identified as the person(s) who will aid the patient in the event of a mental health crisis (suicidal ideations/suicide attempt).  With written consent from the patient, the family member/significant other has been provided the following suicide prevention education, prior to the and/or following the discharge of the patient.  The suicide prevention education provided includes the following:  Suicide risk factors  Suicide prevention and interventions  National Suicide Hotline telephone number  [facility] assessment telephone number  [facility] Emergency Assistance 911  [facility] and/or Residential Mobile Crisis Unit telephone number  Request made of family/significant other to:  Remove weapons (e.g., guns, rifles, knives), all items previously/currently identified as safety concern.    Remove drugs/medications (over-the-counter, prescriptions, illicit drugs), all items previously/currently identified as a safety concern.  The family member/significant other verbalizes understanding of the suicide prevention education information provided.  The family member/significant other agrees to remove the items of safety concern listed above.  CSW spoke with mother, Lindsay Lara, regarding follow up plans. Mother reports that she and the patient intend to relocate to [place], [place] and that patient will start an IOP program after they relocate. Mother says patient's father is currently working with patient's health insurance regarding covered programs.  Mother is not comfortable with the patient discharging from [facility] and following up with only outpatient services with [facility] until they  relocate. Mother was hoping for patient to go to residential treatment. CSW reviewed information regarding PHP, mother thinks this would be a great option for patient, especially while the family coordinates relocating. Mother shared that CSW should approach PHP by using motivational interviewing techniques and providing patient with information and following up later.  Mother reports there are no guns in the home and that she has purchased a lock box for patient's medications. She states she will be closely monitoring the patient for the next year.  Lindsay Lara 04/18/2018, 11:07 AM

## 2018-04-19 DIAGNOSIS — F1910 Other psychoactive substance abuse, uncomplicated: Secondary | ICD-10-CM
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
## 2018-04-19 NOTE — [facility] Group Notes (Signed)
[facility] LCSW Group Therapy Note  Date/Time 04/18/2018 1:30 PM  Type of Therapy/Topic:  Group Therapy:  Feelings about Diagnosis  Participation Level:  Did Not Attend   Mood:   Description of Group:    This group will allow patients to explore their thoughts and feelings about diagnoses they have received. Patients will be guided to explore their level of understanding and acceptance of these diagnoses. Facilitator will encourage patients to process their thoughts and feelings about the reactions of others to their diagnosis, and will guide patients in identifying ways to discuss their diagnosis with significant others in their lives. This group will be process-oriented, with patients participating in exploration of their own experiences as well as giving and receiving support and challenge from other group members.   Raoul Pitch, MSW Intern CSW Department 04/19/2018 8:14 AM

## 2018-04-19 NOTE — Progress Notes (Signed)
Patient did not attend the evening speaker AA meeting. Pt was notified that group was beginning while patient was getting medication. After pt received her medication she declined group participation and returned to her room.

## 2018-04-19 NOTE — Tx Team (Signed)
Interdisciplinary Treatment and Diagnostic Plan Update  04/19/2018 Time of Session: 9:00am Lindsay Lara MRN: [id number]  Principal Diagnosis: <principal problem not specified>  Secondary Diagnoses: Active Problems:   Opioid dependence (HCC)   MDD (major depressive disorder), severe (HCC)   Current Medications:  Current Facility-Administered Medications  Medication Dose Route Frequency Provider Last Rate Last Dose  . acetaminophen (TYLENOL) tablet 650 mg  650 mg Oral Q6H PRN Antonieta Pert, MD   650 mg at 04/19/18 1034  . alum & mag hydroxide-simeth (MAALOX/MYLANTA) 200-200-20 MG/5ML suspension 30 mL  30 mL Oral Q4H PRN Antonieta Pert, MD   30 mL at 04/18/18 1811  . dicyclomine (BENTYL) tablet 20 mg  20 mg Oral Q6H PRN Antonieta Pert, MD   20 mg at 04/18/18 1334  . feeding supplement (ENSURE ENLIVE) (ENSURE ENLIVE) liquid 237 mL  237 mL Oral BID BM Antonieta Pert, MD   237 mL at 04/19/18 0808  . folic acid (FOLVITE) tablet 1 mg  1 mg Oral Daily Antonieta Pert, MD   1 mg at 04/19/18 0809  . gabapentin (NEURONTIN) capsule 400 mg  400 mg Oral BID Antonieta Pert, MD   400 mg at 04/19/18 0809  . hydrOXYzine (ATARAX/VISTARIL) tablet 25 mg  25 mg Oral Q6H PRN Antonieta Pert, MD   25 mg at 04/19/18 1034  . ibuprofen (ADVIL,MOTRIN) tablet 400 mg  400 mg Oral Q6H PRN Antonieta Pert, MD   400 mg at 04/18/18 1304  . loperamide (IMODIUM) capsule 2-4 mg  2-4 mg Oral PRN Antonieta Pert, MD      . magnesium hydroxide (MILK OF MAGNESIA) suspension 30 mL  30 mL Oral Daily PRN Antonieta Pert, MD      . methocarbamol (ROBAXIN) tablet 500 mg  500 mg Oral Q8H PRN Antonieta Pert, MD   500 mg at 04/19/18 1034  . metoprolol tartrate (LOPRESSOR) tablet 12.5 mg  12.5 mg Oral BID Antonieta Pert, MD   12.5 mg at 04/19/18 0810  . ondansetron (ZOFRAN-ODT) disintegrating tablet 4 mg  4 mg Oral Q6H PRN Antonieta Pert, MD   4 mg at 04/19/18 0816  . oxyCODONE (Oxy  IR/ROXICODONE) immediate release tablet 5 mg  5 mg Oral Q6H PRN Antonieta Pert, MD   5 mg at 04/19/18 0816  . QUEtiapine (SEROQUEL) tablet 300 mg  300 mg Oral BID Antonieta Pert, MD   300 mg at 04/19/18 0810  . sertraline (ZOLOFT) tablet 50 mg  50 mg Oral Daily Antonieta Pert, MD   50 mg at 04/19/18 0810  . thiamine (VITAMIN B-1) tablet 100 mg  100 mg Oral Daily Antonieta Pert, MD   100 mg at 04/19/18 0810  . traZODone (DESYREL) tablet 50 mg  50 mg Oral QHS PRN Antonieta Pert, MD   50 mg at 04/18/18 2142  . valACYclovir (VALTREX) tablet 1,000 mg  1,000 mg Oral Daily Antonieta Pert, MD   1,000 mg at 04/19/18 4431   PTA Medications: Medications Prior to Admission  Medication Sig Dispense Refill Last Dose  . bisacodyl (DULCOLAX) 10 MG suppository Place 1 suppository (10 mg total) rectally daily as needed for moderate constipation. 12 suppository 0   . clonazePAM (KLONOPIN) 1 MG tablet Take 1 tablet (1 mg total) by mouth 2 (two) times daily. 30 tablet 0   . folic acid (FOLVITE) 1 MG tablet Take 1 tablet (1 mg total) by mouth  daily.     . gabapentin (NEURONTIN) 400 MG capsule Take 1 capsule (400 mg total) by mouth 2 (two) times daily.     . hydrOXYzine (ATARAX/VISTARIL) 25 MG tablet Take 1 tablet (25 mg total) by mouth every 8 (eight) hours. 30 tablet 0   . levonorgestrel-ethinyl estradiol (INTROVALE) 0.15-0.03 MG tablet Take 1 tablet by mouth daily. Birth control method. 1 tablet 0 unk  . metoprolol tartrate (LOPRESSOR) 25 MG tablet Take 0.5 tablets (12.5 mg total) by mouth 2 (two) times daily.     Marland Kitchen. oxyCODONE 10 MG TABS Take 1 tablet (10 mg total) by mouth every 6 (six) hours. 5 tablet 0   . polyethylene glycol (MIRALAX / GLYCOLAX) packet Take 17 g by mouth 2 (two) times daily. 14 each 0   . QUEtiapine (SEROQUEL) 300 MG tablet Take 1 tablet (300 mg total) by mouth 2 (two) times daily.     . QUEtiapine (SEROQUEL) 50 MG tablet Take 1 tablet (50 mg total) by mouth 3 (three)  times daily as needed (anxiety, panic attacks).     . senna-docusate (SENOKOT-S) 8.6-50 MG tablet Take 2 tablets by mouth 2 (two) times daily.     . sertraline (ZOLOFT) 50 MG tablet Take 1 tablet (50 mg total) by mouth daily.     Marland Kitchen. thiamine 100 MG tablet Take 1 tablet (100 mg total) by mouth daily.     . valACYclovir (VALTREX) 1000 MG tablet Take 1 tablet (1,000 mg total) by mouth daily. May increase to 2 tablets twice daily for 5 days during an outbreak. 1 tablet 0 03/21/2018 at Unknown time  . [START ON 04/22/2018] cloNIDine (CATAPRES - DOSED IN MG/24 HR) 0.3 mg/24hr patch Place 1 patch (0.3 mg total) onto the skin once a week. 1 patch 0     Patient Stressors: Marital or family conflict Medication change or noncompliance Substance abuse  Patient Strengths: Ability for insight Average or above average intelligence Capable of independent living General fund of knowledge Motivation for treatment/growth Supportive family/friends  Treatment Modalities: Medication Management, Group therapy, Case management,  1 to 1 session with clinician, Psychoeducation, Recreational therapy.   Physician Treatment Plan for Primary Diagnosis: <principal problem not specified> Long Term Goal(s): Improvement in symptoms so as ready for discharge Improvement in symptoms so as ready for discharge   Short Term Goals: Ability to identify changes in lifestyle to reduce recurrence of condition will improve Ability to verbalize feelings will improve Ability to disclose and discuss suicidal ideas Ability to demonstrate self-control will improve Ability to identify and develop effective coping behaviors will improve Ability to maintain clinical measurements within normal limits will improve Ability to identify triggers associated with substance abuse/mental health issues will improve Ability to identify changes in lifestyle to reduce recurrence of condition will improve Ability to verbalize feelings will  improve Ability to disclose and discuss suicidal ideas Ability to demonstrate self-control will improve Ability to identify and develop effective coping behaviors will improve Ability to maintain clinical measurements within normal limits will improve Ability to identify triggers associated with substance abuse/mental health issues will improve  Medication Management: Evaluate patient's response, side effects, and tolerance of medication regimen.  Therapeutic Interventions: 1 to 1 sessions, Unit Group sessions and Medication administration.  Evaluation of Outcomes: Progressing  Physician Treatment Plan for Secondary Diagnosis: Active Problems:   Opioid dependence (HCC)   MDD (major depressive disorder), severe (HCC)  Long Term Goal(s): Improvement in symptoms so as ready for discharge Improvement in symptoms  so as ready for discharge   Short Term Goals: Ability to identify changes in lifestyle to reduce recurrence of condition will improve Ability to verbalize feelings will improve Ability to disclose and discuss suicidal ideas Ability to demonstrate self-control will improve Ability to identify and develop effective coping behaviors will improve Ability to maintain clinical measurements within normal limits will improve Ability to identify triggers associated with substance abuse/mental health issues will improve Ability to identify changes in lifestyle to reduce recurrence of condition will improve Ability to verbalize feelings will improve Ability to disclose and discuss suicidal ideas Ability to demonstrate self-control will improve Ability to identify and develop effective coping behaviors will improve Ability to maintain clinical measurements within normal limits will improve Ability to identify triggers associated with substance abuse/mental health issues will improve     Medication Management: Evaluate patient's response, side effects, and tolerance of medication  regimen.  Therapeutic Interventions: 1 to 1 sessions, Unit Group sessions and Medication administration.  Evaluation of Outcomes: Progressing   RN Treatment Plan for Primary Diagnosis: <principal problem not specified> Long Term Goal(s): Knowledge of disease and therapeutic regimen to maintain health will improve  Short Term Goals: Ability to demonstrate self-control, Ability to participate in decision making will improve, Ability to verbalize feelings will improve, Ability to identify and develop effective coping behaviors will improve and Compliance with prescribed medications will improve  Medication Management: RN will administer medications as ordered by provider, will assess and evaluate patient's response and provide education to patient for prescribed medication. RN will report any adverse and/or side effects to prescribing provider.  Therapeutic Interventions: 1 on 1 counseling sessions, Psychoeducation, Medication administration, Evaluate responses to treatment, Monitor vital signs and CBGs as ordered, Perform/monitor CIWA, COWS, AIMS and Fall Risk screenings as ordered, Perform wound care treatments as ordered.  Evaluation of Outcomes: Progressing   LCSW Treatment Plan for Primary Diagnosis: <principal problem not specified> Long Term Goal(s): Safe transition to appropriate next level of care at discharge, Engage patient in therapeutic group addressing interpersonal concerns.  Short Term Goals: Engage patient in aftercare planning with referrals and resources, Facilitate patient progression through stages of change regarding substance use diagnoses and concerns, Identify triggers associated with mental health/substance abuse issues and Increase skills for wellness and recovery  Therapeutic Interventions: Assess for all discharge needs, 1 to 1 time with Social worker, Explore available resources and support systems, Assess for adequacy in community support network, Educate family  and significant other(s) on suicide prevention, Complete Psychosocial Assessment, Interpersonal group therapy.  Evaluation of Outcomes: Progressing   Progress in Treatment: Attending groups: No. Participating in groups: No. Taking medication as prescribed: Yes. Toleration medication: Yes. Family/Significant other contact made: Yes, individual(s) contacted:  mother Patient understands diagnosis: No. Discussing patient identified problems/goals with staff: Yes. Medical problems stabilized or resolved: No. Denies suicidal/homicidal ideation: Yes. Issues/concerns per patient self-inventory: Yes.   New problem(s) identified: Yes, Describe:  patient declines referrals for residential treatment and PHP; she had a very serious suicide attempt and wants to wait several weeks before entering an IOP program. Inappropriate behaviors while on unit.  New Short Term/Long Term Goal(s): detox, medication management for mood stabilization; elimination of SI thoughts; development of comprehensive mental wellness/sobriety plan.  Patient Goals: Wants to discharge ASAP and follow up with outpatient. Maintain sobriety.  Discharge Plan or Barriers: Continuing to assess. Patient family hoping patient will agree to [facility]  Reason for Continuation of Hospitalization: Anxiety Depression Withdrawal symptoms  Estimated Length of Stay:  3-5 days  Attendees: Patient: 04/19/2018 10:38 AM  Physician:  04/19/2018 10:38 AM  Nursing:  04/19/2018 10:38 AM  RN Care Manager: 04/19/2018 10:38 AM  Social Worker: Enid Cutterharlotte David Rodriquez, Theresia MajorsLCSWA 04/19/2018 10:38 AM  Recreational Therapist:  04/19/2018 10:38 AM  Other:  04/19/2018 10:38 AM  Other:  04/19/2018 10:38 AM  Other: 04/19/2018 10:38 AM    Scribe for Treatment Team: Darreld Mcleanharlotte C Ilah Boule, LCSWA 04/19/2018 10:38 AM

## 2018-04-19 NOTE — Progress Notes (Signed)
D:Pt rates depression as a 0 and anxiety as a 6 on 0-10 scale with 10 being the most. Pt has asked for multiple prn medications today and is very impulsive not waiting for them to start working before asking for another. Pt reports multiple withdrawal symptoms. A:Offered support, encouragement and 15 minute checks. R:Pt denies si and hi. Safety maintained on the unit.

## 2018-04-19 NOTE — Progress Notes (Signed)
Recreation Therapy Notes  Date:  2.14.19 Time: 0930 Location: [address] Dayroom  Group Topic: Stress Management  Goal Area(s) Addresses:  Patient will identify positive stress management techniques. Patient will identify benefits of using stress management post d/c.  Behavioral Response:  Engaged  Intervention: Stress Management  Activity :  Meditation.  LRT introduced the stress management technique of meditation.  LRT played a meditation on having a purposeful day.  Patients were to listen and follow along as meditation played.  Education:  Stress Management, Discharge Planning.   Education Outcome: Acknowledges Education  Clinical Observations/Feedback: Pt attended and participated in group.     Lindsay Lara, LRT/CTRS         Lindsay Lara A 04/19/2018 12:15 PM 

## 2018-04-20 DIAGNOSIS — F332 Major depressive disorder, recurrent severe without psychotic features: Principal | ICD-10-CM

## 2018-04-20 DIAGNOSIS — F4310 Post-traumatic stress disorder, unspecified: Secondary | ICD-10-CM | POA: Diagnosis present

## 2018-04-20 NOTE — [facility] Group Notes (Signed)
LCSW Group Therapy Note  04/20/2018    10:00-11:00am   Type of Therapy and Topic:  Group Therapy: Shame and Its Impact   Participation Level:  Minimal   Description of Group:   In this group, patients shared and discussed that guilt is the negative feeling we have when we've done something wrong, while shame is the negative feeling we have simply about "being."  In listening to each other share, patients learned that humans are all imperfect and that there is no shame in this.  We discussed how it could positively impact our wellbeing by accepting our faults as part of our being that can be worked on but does not have to shame [place].    Therapeutic Goals: 1. Patients will learn the difference between guilt and shame. 2. Patients will share their current shame feelings and how this has impacted their current lives. 3. Patients will explore possible ways to think differently about those parts of their bodies, feelings, and lives about which they do have shame. 4. Patients will learn that shame is universal, and that keeping our shame a secret actually increases its hold on [place].  Summary of Patient Progress:  The patient shared that she feels shame about her suicide attempt because her mother found her.  This is of concern because she has put her parents through a lot.  She did not talk any more during group than this initial share.  Therapeutic Modalities:   Cognitive Behavioral Therapy Motivation Interviewing  Lynnell Chad  .

## 2018-04-20 NOTE — Progress Notes (Addendum)
[facility]  04/20/2018 11:10 AM Lindsay Lara  MRN:  [id number]   Subjective:  18 yo female who was transferred from the medical hospital after stabilizing after a severe overdose, some cognitive issues linger--difficulty processing information.  Minimizes her depression stating it only occurs when she uses.  Minimal insight to the severity of her substance abuse.  Reports she has coping skills but cannot process if she would use them.  Focused on going to IOP and long-term treatment, no self-motivation to improve looks to external factors:  "Her helicopter mother" and treatment facilities.  Principal Problem: MDD (major depressive disorder), recurrent severe, without psychosis (HCC) Diagnosis: Principal Problem:   MDD (major depressive disorder), recurrent severe, without psychosis (HCC) Active Problems:   Sedative, hypnotic or anxiolytic dependence (HCC)   Borderline personality disorder in adult [facility](HCC)   PTSD (post-traumatic stress disorder)   Opioid dependence (HCC)  Total Time spent with patient: 30 minutes  Past Psychiatric History: substance abuse, depression, ADHD  Past Medical History:  Past Medical History:  Diagnosis Date  . Acne   . ADHD (attention deficit hyperactivity disorder) 05/08/2012  . Anxiety   . Bipolar and related disorder (HCC) 09/18/2015  . Depression   . 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 Pneumonia at age 89  . Polysubstance abuse (HCC) 09/18/2015  . Scoliosis no treatment required  . Substance induced mood disorder (HCC) 09/18/2015   History reviewed. No pertinent surgical history. Family History:  Family History  Problem Relation Age of Onset  . Alcoholism Father        and Pater[place] grandfather  . Heart disease Father   . Hyperlipidemia Father   . Drug abuse Paternal Uncle    Family Psychiatric  History: see above Social History:  Social History   Substance and Sexual Activity  Alcohol Use Yes   Comment: occasional      Social History   Substance and Sexual Activity  Drug Use Yes  . Types: Marijuana, Benzodiazepines, Cocaine   Comment: acid, xanax    Social History   Socioeconomic History  . Marital status: Single    Spouse name: Not on file  . Number of children: Not on file  . Years of education: Not on file  . Highest education level: Not on file  Occupational History  . Not on file  Social Needs  . Financial resource strain: Not on file  . Food insecurity:    Worry: Not on file    Inability: Not on file  . Transportation needs:    Medical: Not on file    Non-medical: Not on file  Tobacco Use  . Smoking status: Current Every Day Smoker    Packs/day: 0.50    Types: Cigarettes    Last attempt to quit: 01/11/2016    Years since quitting: 2.2  . Smokeless tobacco: Never Used  Substance and Sexual Activity  . Alcohol use: Yes    Comment: occasional  . Drug use: Yes    Types: Marijuana, Benzodiazepines, Cocaine    Comment: acid, xanax  . Sexual activity: Yes    Partners: Male    Birth control/protection: Pill  Lifestyle  . Physical activity:    Days per week: Not on file    Minutes per session: Not on file  . Stress: Not on file  Relationships  . Social connections:    Talks on phone: Not on file    Gets together: Not on file  Attends religious service: Not on file    Active member of club or organization: Not on file    Attends meetings of clubs or organizations: Not on file    Relationship status: Not on file  Other Topics Concern  . Not on file  Social History Narrative  . Not on file   Additional Social History:      Sleep: Fair  Appetite:  Fair  Current Medications: Current Facility-Administered Medications  Medication Dose Route Frequency Provider Last Rate Last Dose  . acetaminophen (TYLENOL) tablet 650 mg  650 mg Oral Q6H PRN Antonieta Pert, MD   650 mg at 04/20/18 9518  . alum & mag hydroxide-simeth (MAALOX/MYLANTA) 200-200-20 MG/5ML suspension 30 mL   30 mL Oral Q4H PRN Antonieta Pert, MD   30 mL at 04/18/18 1811  . dicyclomine (BENTYL) tablet 20 mg  20 mg Oral Q6H PRN Antonieta Pert, MD   20 mg at 04/18/18 1334  . feeding supplement (ENSURE ENLIVE) (ENSURE ENLIVE) liquid 237 mL  237 mL Oral BID BM Antonieta Pert, MD   237 mL at 04/19/18 2010  . folic acid (FOLVITE) tablet 1 mg  1 mg Oral Daily Antonieta Pert, MD   1 mg at 04/20/18 0817  . gabapentin (NEURONTIN) capsule 400 mg  400 mg Oral BID Antonieta Pert, MD   400 mg at 04/20/18 0818  . hydrOXYzine (ATARAX/VISTARIL) tablet 25 mg  25 mg Oral Q6H PRN Antonieta Pert, MD   25 mg at 04/19/18 1834  . ibuprofen (ADVIL,MOTRIN) tablet 400 mg  400 mg Oral Q6H PRN Antonieta Pert, MD   400 mg at 04/19/18 2015  . loperamide (IMODIUM) capsule 2-4 mg  2-4 mg Oral PRN Antonieta Pert, MD      . magnesium hydroxide (MILK OF MAGNESIA) suspension 30 mL  30 mL Oral Daily PRN Antonieta Pert, MD      . methocarbamol (ROBAXIN) tablet 500 mg  500 mg Oral Q8H PRN Antonieta Pert, MD   500 mg at 04/19/18 2015  . metoprolol tartrate (LOPRESSOR) tablet 12.5 mg  12.5 mg Oral BID Antonieta Pert, MD   12.5 mg at 04/20/18 0818  . ondansetron (ZOFRAN-ODT) disintegrating tablet 4 mg  4 mg Oral Q6H PRN Antonieta Pert, MD   4 mg at 04/20/18 8416  . QUEtiapine (SEROQUEL) tablet 300 mg  300 mg Oral BID Antonieta Pert, MD   300 mg at 04/20/18 0818  . sertraline (ZOLOFT) tablet 50 mg  50 mg Oral Daily Antonieta Pert, MD   50 mg at 04/20/18 0818  . thiamine (VITAMIN B-1) tablet 100 mg  100 mg Oral Daily Antonieta Pert, MD   100 mg at 04/20/18 0818  . traZODone (DESYREL) tablet 50 mg  50 mg Oral QHS PRN Antonieta Pert, MD   50 mg at 04/19/18 2015  . valACYclovir (VALTREX) tablet 1,000 mg  1,000 mg Oral Daily Antonieta Pert, MD   1,000 mg at 04/20/18 6063    Lab Results: No results found for this or any previous visit (from the past 48 hour(s)).  Blood Alcohol  level:  Lab Results  Component Value Date   [facility] <10 03/21/2018   ETH <10 02/07/2018    Metabolic Disorder Labs: Lab Results  Component Value Date   HGBA1C 5.0 02/10/2018   MPG 96.8 02/10/2018   MPG 103 09/17/2015   No results found for: PROLACTIN Lab Results  Component Value Date   CHOL 157 02/10/2018   TRIG 311 (H) 04/04/2018   HDL 47 02/10/2018   CHOLHDL 3.3 02/10/2018   VLDL 30 02/10/2018   LDLCALC 80 02/10/2018   LDLCALC 89 09/18/2015    Physical Findings: AIMS: Facial and Oral Movements Muscles of Facial Expression: None, normal Lips and Perioral Area: None, normal Jaw: None, normal Tongue: None, normal,Extremity Movements Upper (arms, wrists, hands, fingers): None, normal Lower (legs, knees, ankles, toes): None, normal, Trunk Movements Neck, shoulders, hips: None, normal, Overall Severity Severity of abnormal movements (highest score from questions above): None, normal Incapacitation due to abnormal movements: None, normal Patient's awareness of abnormal movements (rate only patient's report): No Awareness, Dental Status Current problems with teeth and/or dentures?: No Does patient usually wear dentures?: No  CIWA:  CIWA-Ar Total: 4 COWS:  COWS Total Score: 5  Musculoskeletal: Strength & Muscle Tone: within normal limits Gait & Station: normal Patient leans: N/A  Psychiatric Specialty Exam: Physical Exam  Nursing note and vitals reviewed. Constitutional: She appears well-developed.  HENT:  Head: Normocephalic.  Respiratory: Effort normal.  Musculoskeletal: Normal range of motion.  Neurological: She is alert.  Psychiatric: Her speech is normal and behavior is normal. Thought content normal. Her affect is blunt. Cognition and memory are impaired. She expresses impulsivity. She exhibits a depressed mood.    Review of Systems  Psychiatric/Behavioral: Positive for depression and substance abuse.  All other systems reviewed and are negative.   Blood  pressure 100/68, pulse 86, temperature 97.9 F (36.6 C), temperature source Oral, resp. rate 20, height 5\' 4"  (1.626 m), weight 48.1 kg, last menstrual period 03/21/2018.Body mass index is 18.19 kg/m.  General Appearance: Casual  Eye Contact:  Good  Speech:  Normal Rate  Volume:  Increased  Mood:  Depressed  Affect:  Blunt  Thought Process:  Coherent and Descriptions of Associations: Intact  Orientation:  Full (Time, Place, and Person)  Thought Content:  Rumination  Suicidal Thoughts:  No  Homicidal Thoughts:  No  Memory:  Immediate;   Fair Recent;   Fair Remote;   Fair  Judgement:  Poor  Insight:  Fair  Psychomotor Activity:  Decreased  Concentration:  Concentration: Fair and Attention Span: Fair  Recall:  [employer] of Knowledge:  Fair  Language:  Fair  Akathisia:  No  Handed:  Right  AIMS (if indicated):     Assets:  Housing Leisure Time Resilience Social Support  ADL's:  Intact  Cognition:  Impaired,  Mild  Sleep:  Number of Hours: 6    Treatment Plan Summary: Daily contact with patient to assess and evaluate symptoms and progress in treatment, Medication management and Plan major depressive disorder, recurrent, severe without psychosis:  -Continued Zoloft 50 mg daily -Continued Seroquel 300 mg at bedtime  Insomnia: -Continued Trazodone 50 mg at bedtime PRN  Anxiety: -Continued hydroxyzine 25 mg every six hours PRN anxiety -Continued Seroquel 300 mg at bedtime  Substance abuse: -Continued Robaxin 500 mg every 8 hours PRN muscle aches -Seeking long term rehab  Safety: Will continue 15 minute observation for safety checks. Patient is able to contract for safety on the unit at this time  Labs: Chem WDL except total protein of 8.2H, CBC WDL, negative for acetaminophen and salicylate and alcohol and drugs.  Continue to develop treatment plan to decrease risk of relapse upon discharge and to reduce the need for readmission.  Psycho-social education regarding  relapse prevention and self care.  Health care follow  up as needed for medical problems.  Continue to attend and participate in therapy.   Nanine MeansLORD, Lindsay Pickney, NP 04/20/2018, 11:10 AM

## 2018-04-20 NOTE — Progress Notes (Signed)
Patient attended AA group meeting.  

## 2018-04-20 NOTE — [facility] Group Notes (Signed)
[facility] Group Notes:  (Nursing)  Date:  04/20/2018  Time:  1430 Type of Therapy:  Nurse Education  Participation Level:  Did Not Attend   Shela Nevin 04/20/2018, 3:53 PM

## 2018-04-20 NOTE — Progress Notes (Signed)
Patient is agreeable to PHP through [facility]. CSW sent referral on 04/19/2018. CSW contacted patient's mother, Lindsay Lara, to update mother. Marchelle Folks is very pleased that patient is agreeable to PHP.  Mother is curious if patient is able to accompany her on a cruise, March 7-15. Mother hopes that patient could complete PHP for 2 weeks, join mother on the family cruise, and then afterward start IOP through [facility] outpatient. CSW shared she is not sure if this would work due to the attendance policies of PHP and IOP, but would ask on Monday when following up on PHP referral.  Enid Cutter, LCSW-A Clinical Social Worker

## 2018-04-20 NOTE — Progress Notes (Signed)
Pt in room with mom visiting.  Pt is calm and cooperative.  Pt has trach with bandage.  Pt denies any discomfort at this time.  Pt sts she wants every medication that is available for her this evening.  Pt advised that prn meds are for specifics issues and she will need to describe why she needs a particular medication.  Pt is frustrated and does not appear sincere about her conditions and is med seeking.  Pt denies SI, HI and AVH.  Pt verbally contracts for safety and denies other pain or discomfort.  Pt is med compliant.   Pt request prn sleep med and scheduled meds.. Pt given support and encouragement. Pt in room sleeping.  Pt remains safe on unit.

## 2018-04-20 NOTE — Progress Notes (Signed)
Pt attends group therapy and participates.  Pt denies pain or discomfort.  Pt trach wound is dry and intact.  Bandage fell off and is replaced with large size bandaid.  Pt is preoccupied with what meds she can have and is returning to med window to confirm there is noting else she can have "I just want everything that I can have".   Pt given prn medications based on pt description of symptoms.  Pt offered support and encouragement. Pt remains safe on unit

## 2018-04-20 NOTE — Plan of Care (Signed)
  Problem: Activity: Goal: Interest or engagement in activities will improve Outcome: Progressing   Problem: Coping: Goal: Ability to verbalize frustrations and anger appropriately will improve Outcome: Progressing   D: Pt alert and oriented on the unit. Pt engaging with RN staff and other pts. Pt denies SI/HI, A/VH. Pt's affect was flat and blunted. Pt's dressing for her trach was changed at 1445 with no signs of infection. Pt rated her depression a 0, feelings of hopelessness a 0, and anxiety a 4, with 10 being the worst. Pt's goal for today is to "learn to stay sober for the long run." Pt is cooperative.  A: Education, support and encouragement provided, q15 minute safety checks remain in effect. Medications administered per MD orders.  R: No reactions/side effects to medicine noted. Pt denies any concerns at this time, and verbally contracts for safety. Pt ambulating on the unit with no issues. Pt remains safe on and off the unit.

## 2018-04-21 NOTE — Progress Notes (Signed)
Nursing Progress Note: 7p-7a D: Pt currently presents with a anxious/sullen/depressed affect and behavior. Pt states "I've been feeling better. I really get nervous in groups and have to leave." Interacting minimally with the milieu. Pt reports good sleep during the previous night with current medication regimen. Pt did attend wrap-up group.  A: Pt provided with medications per providers orders. Pt's labs and vitals were monitored throughout the night. Pt supported emotionally and encouraged to express concerns and questions. Pt educated on medications.  R: Pt's safety ensured with 15 minute and environmental checks. Pt currently denies SI, HI, and AVH. Pt verbally contracts to seek staff if SI,HI, or AVH occurs and to consult with staff before acting on any harmful thoughts. Will continue to monitor.

## 2018-04-21 NOTE — [facility] Group Notes (Signed)
[facility] LCSW Group Therapy Note  04/21/2018  10:00-11:00AM  Type of Therapy and Topic:  Group Therapy:  Adding Supports Including Being Your Own Support  Participation Level:  Minimal   Description of Group:  Patients in this group were introduced to the concept that additional supports including self-support are an essential part of recovery.  A song entitled "I Need Help!" was played and a group discussion was held in reaction to the idea of needing to add supports.  A song entitled "My Own Hero" was played and a group discussion ensued in which patients stated they could relate to the song and it inspired them to realize they have be willing to help themselves in order to succeed, because other people cannot achieve sobriety or stability for them.  We discussed adding a variety of healthy supports to address the various needs in their lives.  A song was played called "I Know Where I've Been" toward the end of group and used to conduct an inspirational wrap-up to group of remembering how far they have already come in their journey.  Therapeutic Goals: 1)  demonstrate the importance of being a part of one's own support system 2)  discuss reasons people in one's life may eventually be unable to be continually supportive  3)  identify the patient's current support system and   4)  elicit commitments to add healthy supports and to become more conscious of being self-supportive   Summary of Patient Progress:  The patient expressed that her mother is a healthy support because she wants the best for patient; however, her father is an unhealthy support.  She herself is also an unhealthy support.  She said that she tried Narcotics Anonymous for 3 months but is uncomfortable in groups due to social anxiety so did not find it helpful.  When suggestions were made to her about how to help with this, she did not seem interested. She eventually left group and stayed out the remainder of the time.   Therapeutic  Modalities:   Motivational Interviewing Activity  Lynnell Chad

## 2018-04-21 NOTE — Progress Notes (Signed)
D:  Patient's self inventory sheet, patient has fair sleep, sleep medication helpful.  Good appetite, normal energy level, good concentration.  Denied depression and hopeless, anxiety 7.  Denied withdrawals.  Denied SI.  Denied physical problems.  Denied physical pain.  Pain medicine helpful.  Goal is develop discharge plan and plan on staying sober forever.  Plans to talk to trustworthy staff.  "I'm ready to leave and fully trust myself to never relapse again." A:  Medications administered per MD orders.  Emotional support and encouragement given patient. R:  Denied SI and HI, contracts for safety.  Denied A/V hallucinations.  Safety maintained with 15 minute checks.
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
## 2018-04-21 NOTE — Progress Notes (Signed)
Patient did attend the evening speaker AA meeting.  

## 2018-04-21 NOTE — Plan of Care (Signed)
Nurse discussed anxiety, depression, coping skills with patient. 

## 2018-04-22 ENCOUNTER — Ambulatory Visit (HOSPITAL_COMMUNITY)
Admission: AD | Admit: 2018-04-22 | Discharge: 2018-04-22 | Disposition: A | Discharge status: Home / Self Care | Payer: PRIVATE HEALTH INSURANCE | Source: Intra-hospital | Attending: Psychiatry | Admitting: Psychiatry

## 2018-04-22 MED ORDER — VALACYCLOVIR HCL 1 G PO TABS: 1000.0000 mg | ORAL_TABLET | Freq: Every day | ORAL | Status: AC

## 2018-04-22 MED ORDER — QUETIAPINE FUMARATE 300 MG PO TABS: 300.0000 mg | ORAL_TABLET | Freq: Two times a day (BID) | ORAL | 0 refills | Status: DC

## 2018-04-22 MED ORDER — FOLIC ACID 1 MG PO TABS: 1.0000 mg | ORAL_TABLET | Freq: Every day | ORAL | Status: DC

## 2018-04-22 MED ORDER — POLYETHYLENE GLYCOL 3350 17 G PO PACK: 17.0000 g | PACK | Freq: Two times a day (BID) | ORAL | 0 refills | Status: DC

## 2018-04-22 MED ORDER — METOPROLOL TARTRATE 25 MG PO TABS: 12.5000 mg | ORAL_TABLET | Freq: Two times a day (BID) | ORAL | 0 refills | Status: DC

## 2018-04-22 MED ORDER — TRAZODONE HCL 50 MG PO TABS: 50.0000 mg | ORAL_TABLET | Freq: Every evening | ORAL | 0 refills | Status: DC | PRN

## 2018-04-22 MED ORDER — SENNOSIDES-DOCUSATE SODIUM 8.6-50 MG PO TABS: 2.0000 | ORAL_TABLET | Freq: Two times a day (BID) | ORAL | Status: DC

## 2018-04-22 MED ORDER — SERTRALINE HCL 50 MG PO TABS: 50.0000 mg | ORAL_TABLET | Freq: Every day | ORAL | 0 refills | Status: DC

## 2018-04-22 MED ORDER — CLONIDINE 0.3 MG/24HR TD PTWK: 0.3000 mg | MEDICATED_PATCH | TRANSDERMAL | 0 refills | Status: DC

## 2018-04-22 MED ORDER — GABAPENTIN 400 MG PO CAPS: 400.0000 mg | ORAL_CAPSULE | Freq: Two times a day (BID) | ORAL | 0 refills | Status: DC

## 2018-04-22 MED ORDER — HYDROXYZINE HCL 25 MG PO TABS: 25.0000 mg | ORAL_TABLET | Freq: Four times a day (QID) | ORAL | 0 refills | Status: DC | PRN

## 2018-04-22 NOTE — Progress Notes (Signed)
Patient has asked nurse three times to examine her, that she has a cough and congestion.  That she does not want the MD to know because she wants to go home today.  Nurse informed patient that medications needs to be given to patients at this time.  Patient asked another nurse and MHT to examine her but that she does not want MD to know that she has a cough.  T 97/8.

## 2018-04-22 NOTE — Progress Notes (Signed)
[facility] called [phone], Jillyn Hidden said to send patient over to [facility] and they will do x-rays.  [facility] called [phone] and they will be here in about 15 minute to transport patient and MHT to [facility] per MD order. Patient informed and will call her mom to inform her.

## 2018-04-22 NOTE — Progress Notes (Signed)
Recreation Therapy Notes  Date:  2.17.20 Time: 0930 Location: 300 Hall Dayroom  Group Topic: Stress Management  Goal Area(s) Addresses:  Patient will identify positive stress management techniques. Patient will identify benefits of using stress management post d/c.  Behavioral Response: Engaged  Intervention:  Stress Management  Activity :  Meditation.  LRT introduced the stress management technique of meditation.  LRT played a meditation that focused on impermanence.  Patients were to listen and follow as meditation played to engaged in the activity.   Education:  Stress Management, Discharge Planning.   Education Outcome: Acknowledges Education  Clinical Observations/Feedback: Pt listened and participated in group.     Caroll Rancher, LRT/CTRS         Lillia Abed, Darrian Grzelak A 04/22/2018 11:04 AM

## 2018-04-22 NOTE — Progress Notes (Addendum)
Per PHP admissions, pt is not eligible for their program due to recent substance abuse. Referral made for CDIOP with [facility]arm[facility] Muff and with [facility]. Pt is scheduled for discharge today. Social Work Geophysicist/field seismologist will reach out to patient once these referrals have been reviewed.   Stefana Lodico S. Alan Ripper, MSW, LCSW Clinical Social Worker 04/22/2018 10:46 AM

## 2018-04-22 NOTE — Progress Notes (Addendum)
  [facility] Adult Case Management Discharge Plan :  Will you be returning to the same living situation after discharge:  Yes,  home At discharge, do you have transportation home?: Yes,  mother Do you have the ability to pay for your medications: Yes,  insurance  Release of information consent forms completed and submitted to medical records by CSW.   Patient to Follow up at: Follow-up Information    [facility] Follow up on 04/29/2018.   Why:  Your medication management appointment with Caryl Asp is Monday, 2/24 at 3;30p. Please bring your current medications and discharge paperwork from this hospitalization.  Contact information: [address] [place] [address] p: [phone] f: [phone]        [facility] Follow up.   Specialty:  Behavioral Health Why:  The Partial Hospitalization Program recommends that you pursue Chemical Dependancy Intensive Outpatient Program. Social Work Assistant will contact you with this appointment if not provided by discharge. Referral has been made. Thank you.  Contact information: [address] [id number] mc [place] [address] [phone]       [facility] Follow up.   Specialty:  Behavioral Health Why:  Referral made to the [facility]--Social Work Assistant will contact you with assessment date and time if you are accpeted into PHP. Thank you.  Contact information: [address] [place] [address] [phone]           Next level of care provider has access to [facility] Link:no  Safety Planning and Suicide Prevention discussed: Yes,  SPE completed with pt and pt's mother. SPI pamphlet and mobile crisis information provided to pt.   Have you used any form of tobacco in the last 30 days? (Cigarettes, Smokeless Tobacco, Cigars, and/or Pipes): Yes  Has patient been referred to the Quitline?: Patient refused  referral  Patient has been referred for addiction treatment: Yes  Rona Ravens, LCSW 04/22/2018, 11:44 AM
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
## 2018-04-22 NOTE — Progress Notes (Signed)
Patient stated she has not seen a MD since she has been here.  Nurse informed patient that she has notes for the past 3 days written by NPs.  Stated she wants to go to ER.

## 2018-04-22 NOTE — Progress Notes (Signed)
Discharge Note:  Patient discharged.  Patient denied SI and HI.  Denied A/V hallucinations.  Denied pain.  Suicide prevention information given and discussed with patient who stated she understood and had no questions.  My3 app information also given to patient.  Patient also received survey information to fill out.  Patient stated she received all her belongings, clothing, toiletries, misc items, prescriptions, etc.  Patient stated she appreciated all assistance received from [facility] staff.  All required discharge information given to patient at discharge.   

## 2018-04-22 NOTE — [facility] Suicide Risk Assessment (Signed)
[facility] Discharge Suicide Risk Assessment   Principal Problem: MDD (major depressive disorder), recurrent severe, without psychosis (HCC) Discharge Diagnoses: Principal Problem:   MDD (major depressive disorder), recurrent severe, without psychosis (HCC) Active Problems:   Sedative, hypnotic or anxiolytic dependence (HCC)   Borderline personality disorder in adult (HCC)   Opioid dependence (HCC)   PTSD (post-traumatic stress disorder)   Total Time spent with patient: 15 minutes  Musculoskeletal: Strength & Muscle Tone: within normal limits Gait & Station: normal Patient leans: N/A  Psychiatric Specialty Exam: Review of Systems  All other systems reviewed and are negative.   Blood pressure 127/83, pulse (!) 105, temperature 97.8 F (36.6 C), temperature source Oral, resp. rate 16, height 5\' 4"  (1.626 m), weight 48.1 kg.Body mass index is 18.19 kg/m.  General Appearance: Casual  Eye Contact::  Fair  Speech:  Normal Rate409  Volume:  Normal  Mood:  Anxious  Affect:  Congruent  Thought Process:  Coherent and Descriptions of Associations: Intact  Orientation:  Full (Time, Place, and Person)  Thought Content:  Logical  Suicidal Thoughts:  No  Homicidal Thoughts:  No  Memory:  Immediate;   Poor Recent;   Fair Remote;   Fair  Judgement:  Intact  Insight:  Lacking  Psychomotor Activity:  Normal  Concentration:  Fair  Recall:  [employer] of Knowledge:Fair  Language: Good  Akathisia:  Negative  Handed:  Right  AIMS (if indicated):     Assets:  Communication Skills Desire for Improvement Financial Resources/Insurance Housing Leisure Time Physical Health Resilience Social Support  Sleep:  Number of Hours: 6  Cognition: WNL  ADL's:  Intact   Mental Status Per Nursing Assessment::   On Admission:  NA  Demographic Factors:  Adolescent or young adult, Caucasian and Unemployed  Loss Factors: Decline in physical health  Historical Factors: Impulsivity  Risk  Reduction Factors:   Living with another person, especially a relative and Positive social support  Continued Clinical Symptoms:  Bipolar Disorder:   Bipolar II Depression:   Comorbid alcohol abuse/dependence Impulsivity Alcohol/Substance Abuse/Dependencies Personality Disorders:   Cluster B More than one psychiatric diagnosis Previous Psychiatric Diagnoses and Treatments Medical Diagnoses and Treatments/Surgeries  Cognitive Features That Contribute To Risk:  None    Suicide Risk:  Minimal: No identifiable suicidal ideation.  Patients presenting with no risk factors but with morbid ruminations; may be classified as minimal risk based on the severity of the depressive symptoms  Follow-up Information    [facility] Follow up on 04/29/2018.   Why:  Your medication management appointment with Caryl Asp is Monday, 2/24 at 3;30p. Please bring your current medications and discharge paperwork from this hospitalization.  Contact information: [address] [place] [address] p: [phone] f: [phone]        [facility] Follow up.   Specialty:  Behavioral Health Why:  The Partial Hospitalization Program recommends that you pursue [facility] Dependancy Intensive Outpatient Program. Social Work Assistant will contact you with this appointment if not provided by discharge. Referral has been made. Thank you.  Contact information: [address] [id number] mc [place] [address] [phone]       [facility] Follow up.   Specialty:  Behavioral Health Why:  Referral made to the [facility]ization [facility]--Social Work Assistant will contact you with assessment date and time if you are accpeted into PHP. Thank you.  Contact information: [address] [place] [address] [phone]           Plan Of Care/Follow-up recommendations:  Activity:  ad lib  Antonieta Pert,  MD 04/22/2018, 1:34 PM

## 2018-04-22 NOTE — Progress Notes (Signed)
Patient went to [facility] ER for chest xray morning and returned to [facility].  MHT accompanied patient. Patient has been irritable this morning, very anxious. After returning from hospital, patient was ready to discharge.  Respirations even and unlabored.  No signs/symptoms of pain/distress noted on patient's face body movements. Safety maintained with 15 minute checks.

## 2018-04-25 ENCOUNTER — Ambulatory Visit (INDEPENDENT_AMBULATORY_CARE_PROVIDER_SITE_OTHER): Payer: PRIVATE HEALTH INSURANCE | Admitting: Licensed Clinical Social Worker

## 2018-04-25 ENCOUNTER — Ambulatory Visit (INDEPENDENT_AMBULATORY_CARE_PROVIDER_SITE_OTHER)
Admission: RE | Admit: 2018-04-25 | Discharge: 2018-04-25 | Disposition: A | Discharge status: Home / Self Care | Payer: PRIVATE HEALTH INSURANCE | Source: Ambulatory Visit | Attending: Pulmonary Disease | Admitting: Pulmonary Disease

## 2018-04-25 ENCOUNTER — Ambulatory Visit (INDEPENDENT_AMBULATORY_CARE_PROVIDER_SITE_OTHER): Payer: PRIVATE HEALTH INSURANCE | Admitting: Pulmonary Disease

## 2018-04-25 ENCOUNTER — Encounter: Payer: Self-pay | Admitting: Pulmonary Disease

## 2018-04-25 VITALS — BP 116/74 | HR 128 | Ht 65.0 in | Wt 104.6 lb

## 2018-04-25 DIAGNOSIS — Z23 Encounter for immunization: Secondary | ICD-10-CM

## 2018-04-25 DIAGNOSIS — F603 Borderline personality disorder: Secondary | ICD-10-CM | POA: Diagnosis not present

## 2018-04-25 DIAGNOSIS — R000 Tachycardia, unspecified: Secondary | ICD-10-CM

## 2018-04-25 DIAGNOSIS — F1320 Sedative, hypnotic or anxiolytic dependence, uncomplicated: Secondary | ICD-10-CM

## 2018-04-25 DIAGNOSIS — Z0000 Encounter for general adult medical examination without abnormal findings: Secondary | ICD-10-CM

## 2018-04-25 DIAGNOSIS — F17210 Nicotine dependence, cigarettes, uncomplicated: Secondary | ICD-10-CM | POA: Diagnosis not present

## 2018-04-25 DIAGNOSIS — J9600 Acute respiratory failure, unspecified whether with hypoxia or hypercapnia: Secondary | ICD-10-CM

## 2018-04-25 DIAGNOSIS — T1491XA Suicide attempt, initial encounter: Secondary | ICD-10-CM

## 2018-04-25 DIAGNOSIS — R490 Dysphonia: Secondary | ICD-10-CM

## 2018-04-25 DIAGNOSIS — Z930 Tracheostomy status: Secondary | ICD-10-CM

## 2018-04-25 NOTE — Progress Notes (Signed)
Your chest x-ray results of come back.  Showing no acute changes.  No plan of care changes at this time.  Keep follow-up appointment.    Follow-up with our office if symptoms worsen or you do not feel like you are improving under her current regimen.  It was a pleasure taking care of you,  Brian Mack, FNP 

## 2018-04-25 NOTE — Assessment & Plan Note (Signed)
Assessment: Hoarseness on exam today Multiple intubations when hospitalized recently Speech therapy has informed patient to follow-up with ENT if hoarseness persists greater than 30 days  Plan: Keep recommendations from speech therapy if hoarseness does not improve

## 2018-04-25 NOTE — Assessment & Plan Note (Signed)
Assessment: Tachycardic on exam today  Plan: Follow-up with primary care regarding medication management

## 2018-04-25 NOTE — Assessment & Plan Note (Signed)
Plan: Keep scheduled follow-up with psychiatry Keep scheduled follow-up with outpatient behavioral health for substance abuse counseling Referral to primary care Flu vaccine today Continue to not smoke Follow-up with Dr. Everardo All in 4 to 6 weeks

## 2018-04-25 NOTE — Assessment & Plan Note (Signed)
Assessment: Denies suicidal ideation Denies homicidal ideation Multiple recent suicide attempts History of cutting  Plan: If suicidal or homicidal ideations develop please call 911 and present to an emergency room Keep scheduled follow-up with psychiatry and substance abuse counseling

## 2018-04-25 NOTE — Patient Instructions (Addendum)
Chest x-ray today  Regular dose flu vaccine today  [facility] Continue follow-up with behavioral health outpatient for substance abuse counseling  Congratulations on stopping smoking keep up the hard work  I have referred you to primary care  If you continue to have hoarseness greater than a month outside of the hospital you can follow-up with ENT >>>Contact our office if you need a referral  Stoma is healed today I believe you can proceed forward with showering   Can request medical records from:  [facility] [address] [place], [place] [address] Telephone [phone]  Medical Records: [phone]     Follow-up with Dr. Everardo All in 4 to 6 weeks    It is flu season:   >>> Best ways to protect herself from the flu: Receive the yearly flu vaccine, practice good hand hygiene washing with soap and also using hand sanitizer when available, eat a nutritious meals, get adequate rest, hydrate appropriately   Please contact the office if your symptoms worsen or you have concerns that you are not improving.   Thank you for choosing [facility] for your healthcare, and for allowing [place] to partner with you on your healthcare journey. I am thankful to be able to provide care to you today.   Elisha Headland FNP-C   Influenza Virus Vaccine injection What is this medicine? INFLUENZA VIRUS VACCINE (in floo EN zuh VAHY ruhs vak SEEN) helps to reduce the risk of getting influenza also known as the flu. The vaccine only helps protect you against some strains of the flu. This medicine may be used for other purposes; ask your health care provider or pharmacist if you have questions. COMMON BRAND NAME(S): Afluria, Agriflu, Alfuria, FLUAD, Fluarix, Fluarix Quadrivalent, Flublok, Flublok Quadrivalent, FLUCELVAX, Flulaval, Fluvirin, Fluzone, Fluzone High-Dose, Fluzone Intradermal What should I tell my health  care provider before I take this medicine? They need to know if you have any of these conditions: -bleeding disorder like hemophilia -fever or infection -Guillain-Barre syndrome or other neurological problems -immune system problems -infection with the human immunodeficiency virus (HIV) or AIDS -low blood platelet counts -multiple sclerosis -an unusual or allergic reaction to influenza virus vaccine, latex, other medicines, foods, dyes, or preservatives. Different brands of vaccines contain different allergens. Some may contain latex or eggs. Talk to your doctor about your allergies to make sure that you get the right vaccine. -pregnant or trying to get pregnant -breast-feeding How should I use this medicine? This vaccine is for injection into a muscle or under the skin. It is given by a health care professional. A copy of Vaccine Information Statements will be given before each vaccination. Read this sheet carefully each time. The sheet may change frequently. Talk to your healthcare provider to see which vaccines are right for you. Some vaccines should not be used in all age groups. Overdosage: If you think you have taken too much of this medicine contact a poison control center or emergency room at once. NOTE: This medicine is only for you. Do not share this medicine with others. What if I miss a dose? This does not apply. What may interact with this medicine? -chemotherapy or radiation therapy -medicines that lower your immune system like etanercept, anakinra, infliximab, and adalimumab -medicines that treat or prevent blood clots like warfarin -phenytoin -steroid medicines like prednisone or cortisone -theophylline -vaccines This list may not describe all possible interactions. Give your health care provider a list  of all the medicines, herbs, non-prescription drugs, or dietary supplements you use. Also tell them if you smoke, drink alcohol, or use illegal drugs. Some items may  interact with your medicine. What should I watch for while using this medicine? Report any side effects that do not go away within 3 days to your doctor or health care professional. Call your health care provider if any unusual symptoms occur within 6 weeks of receiving this vaccine. You may still catch the flu, but the illness is not usually as bad. You cannot get the flu from the vaccine. The vaccine will not protect against colds or other illnesses that may cause fever. The vaccine is needed every year. What side effects may I notice from receiving this medicine? Side effects that you should report to your doctor or health care professional as soon as possible: -allergic reactions like skin rash, itching or hives, swelling of the face, lips, or tongue Side effects that usually do not require medical attention (report to your doctor or health care professional if they continue or are bothersome): -fever -headache -muscle aches and pains -pain, tenderness, redness, or swelling at the injection site -tiredness This list may not describe all possible side effects. Call your doctor for medical advice about side effects. You may report side effects to FDA at 1-800-FDA-1088. Where should I keep my medicine? The vaccine will be given by a health care professional in a clinic, pharmacy, doctor's office, or other health care setting. You will not be given vaccine doses to store at home. NOTE: This sheet is a summary. It may not cover all possible information. If you have questions about this medicine, talk to your doctor, pharmacist, or health care provider.  2019 Elsevier/Gold Standard (2014-09-11 10:07:28)

## 2018-04-25 NOTE — Assessment & Plan Note (Signed)
Assessment: Healed stoma on exam today Decannulated on 04/15/2018  Plan: Do not need referral to ENT Can proceed forward with showering

## 2018-04-25 NOTE — Assessment & Plan Note (Signed)
Assessment: Lungs clear to auscultation today Improving fatigue Extended hospital course with multiple intubations and extubations  Plan: Chest x-ray today Follow-up in 4 to 6 weeks with Dr. Everardo All

## 2018-04-25 NOTE — Progress Notes (Signed)
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 patient to quit/stop smoking as soon as possible due to high risk for multiple medical problems.  It will also be very difficult for [place] to manage patient's  respiratory symptoms and status if we continue to expose her lungs to a known irritant.  We do not advise e-cigarettes as a form of stopping smoking.  Patient is willing to quit smoking.  I have advised the patient that we can assist and have options of nicotine replacement therapy, provided smoking cessation education today, provided smoking cessation counseling, and provided cessation resources.  Follow-up next office visit office visit for assessment of smoking cessation.    Smoking cessation counseling advised for: 5 min     Outpatient Encounter Medications as of 04/25/2018  Medication Sig  . cloNIDine (CATAPRES) 0.1 MG tablet Take 0.1 mg by mouth every 6 (six) hours as needed.  . folic acid (FOLVITE) 1 MG tablet Take 1 tablet (1 mg total) by mouth daily. (May buy from over the counter): For folate replacement  . gabapentin (NEURONTIN) 400 MG capsule Take 1 capsule (400 mg total) by mouth 2  (two) times daily. For agitation (Patient taking differently: Take 400 mg by mouth daily. For agitation)  . hydrOXYzine (ATARAX/VISTARIL) 25 MG tablet Take 1 tablet (25 mg total) by mouth every 6 (six) hours as needed for anxiety.  Marland Kitchen QUEtiapine (SEROQUEL) 300 MG tablet Take 1 tablet (300 mg total) by mouth 2 (two) times daily. For mood control (Patient taking differently: Take 300 mg by mouth at bedtime. For mood control)  . sertraline (ZOLOFT) 50 MG tablet Take 1 tablet (50 mg total) by mouth daily. For depression  . valACYclovir (VALTREX) 1000 MG tablet Take 1 tablet (1,000 mg total) by mouth daily. For herpes infection  . metoprolol tartrate (LOPRESSOR) 25 MG tablet Take 0.5 tablets (12.5 mg total) by mouth 2 (two) times daily. For high blood pressure (Patient not taking: Reported on 04/25/2018)  . traZODone (DESYREL) 50 MG tablet Take 1 tablet (50 mg total) by mouth at bedtime as needed for sleep. (Patient not taking: Reported on 04/25/2018)  . [DISCONTINUED] cloNIDine (CATAPRES - DOSED IN MG/24 HR) 0.3 mg/24hr patch Place 1 patch (0.3 mg total) onto the skin once a week. For hypertension (Patient not taking: Reported on 04/25/2018)  . [DISCONTINUED] polyethylene glycol (MIRALAX / GLYCOLAX) packet Take 17 g by mouth 2 (two) times daily. (May buy from over the counter): Constipation (Patient not taking: Reported on 04/25/2018)  . [DISCONTINUED] senna-docusate (SENOKOT-S) 8.6-50 MG tablet Take 2 tablets by mouth 2 (  two) times daily. (May buy from over the counter): For constipation (Patient not taking: Reported on 04/25/2018)   No facility-administered encounter medications on file as of 04/25/2018.      Review of Systems  Review of Systems  Constitutional: Positive for chills and fatigue. Negative for fever (95 temp ).  HENT: Negative for congestion, postnasal drip and sore throat.   Respiratory: Positive for shortness of breath. Negative for cough and wheezing.   Cardiovascular: Positive for  palpitations. Negative for chest pain.  Gastrointestinal: Positive for nausea. Negative for constipation, diarrhea and vomiting.  Musculoskeletal: Positive for arthralgias.  Neurological: Positive for dizziness (a little, occasionally ) and headaches (a little occasionally).  Psychiatric/Behavioral: Negative for dysphoric mood, sleep disturbance and suicidal ideas. The patient is not nervous/anxious.      Physical Exam  BP 116/74 (BP Location: Left Arm, Cuff Size: Normal)   Pulse (!) 128   Ht 5' 5"  (1.651 m)   Wt 104 lb 9.6 oz (47.4 kg)   LMP 04/15/2018 (Within Days)   SpO2 97%   BMI 17.41 kg/m   Wt Readings from Last 5 Encounters:  04/25/18 104 lb 9.6 oz (47.4 kg) (8 %, Z= -1.44)*  04/17/18 106 lb (48.1 kg) (9 %, Z= -1.33)*  04/17/18 106 lb 14.4 oz (48.5 kg) (10 %, Z= -1.26)*  02/07/18 120 lb (54.4 kg) (34 %, Z= -0.41)*  01/01/18 120 lb (54.4 kg) (34 %, Z= -0.40)*   * Growth percentiles are based on CDC (Girls, 2-20 Years) data.     Physical Exam  Constitutional: She is oriented to person, place, and time and well-developed, well-nourished, and in no distress. She does not have a sickly appearance. No distress.  + Thin adult female  HENT:  Head: Normocephalic and atraumatic.  Right Ear: Hearing, tympanic membrane, external ear and ear canal normal.  Left Ear: Hearing, tympanic membrane, external ear and ear canal normal.  Nose: Nose normal. Right sinus exhibits no maxillary sinus tenderness and no frontal sinus tenderness. Left sinus exhibits no maxillary sinus tenderness and no frontal sinus tenderness.  Mouth/Throat: Uvula is midline and oropharynx is clear and moist. No oropharyngeal exudate.  Eyes: Pupils are equal, round, and reactive to light.  Neck: Normal range of motion. Neck supple.  Cardiovascular: Regular rhythm and normal heart sounds. Tachycardia present.  Pulmonary/Chest: Effort normal and breath sounds normal. No accessory muscle usage. No respiratory  distress. She has no decreased breath sounds. She has no wheezes. She has no rhonchi.  Musculoskeletal: Normal range of motion.        General: No edema.  Lymphadenopathy:    She has no cervical adenopathy.  Neurological: She is alert and oriented to person, place, and time. Gait normal.  Skin: Skin is warm and dry. She is not diaphoretic. No erythema.  Psychiatric: Memory and judgment normal. Her mood appears anxious. She expresses no homicidal and no suicidal ideation. She expresses no suicidal plans and no homicidal plans. She has a flat affect.  Nursing note and vitals reviewed.     Lab Results:  CBC    Component Value Date/Time   WBC 8.5 04/16/2018 0233   RBC 3.87 04/16/2018 0233   HGB 11.4 (L) 04/16/2018 0233   HCT 35.8 (L) 04/16/2018 0233   PLT 491 (H) 04/16/2018 0233   MCV 92.5 04/16/2018 0233   MCH 29.5 04/16/2018 0233   MCHC 31.8 04/16/2018 0233   RDW 13.8 04/16/2018 0233   LYMPHSABS 3.6 04/10/2018 0442   MONOABS  0.4 04/10/2018 0442   EOSABS 0.7 (H) 04/10/2018 0442   BASOSABS 0.1 04/10/2018 0442    BMET    Component Value Date/Time   NA 138 04/16/2018 0233   K 4.1 04/16/2018 0233   CL 101 04/16/2018 0233   CO2 26 04/16/2018 0233   GLUCOSE 98 04/16/2018 0233   BUN 8 04/16/2018 0233   CREATININE 0.63 04/16/2018 0233   CREATININE 0.74 11/16/2015 1113   CALCIUM 9.6 04/16/2018 0233   GFRNONAA >60 04/16/2018 0233   GFRAA >60 04/16/2018 0233    BNP No results found for: BNP  ProBNP No results found for: PROBNP    Assessment & Plan:     Healthcare maintenance Plan: Keep scheduled follow-up with psychiatry Keep scheduled follow-up with outpatient behavioral health for substance abuse counseling Referral to primary care Flu vaccine today Continue to not smoke Follow-up with Dr. Loanne Drilling in 4 to 6 weeks  Sinus tachycardia Assessment: Tachycardic on exam today  Plan: Follow-up with primary care regarding medication management  Suicide attempt  [facility]) Assessment: Denies suicidal ideation Denies homicidal ideation Multiple recent suicide attempts History of cutting  Plan: If suicidal or homicidal ideations develop please call 911 and present to an emergency room Keep scheduled follow-up with psychiatry and substance abuse counseling  Status post tracheostomy [facility]) Assessment: Healed stoma on exam today Decannulated on 04/15/2018  Plan: Do not need referral to ENT Can proceed forward with showering    Acute respiratory failure ([place]) Assessment: Lungs clear to auscultation today Improving fatigue Extended hospital course with multiple intubations and extubations  Plan: Chest x-ray today Follow-up in 4 to 6 weeks with Dr. Loanne Drilling  Hoarseness of voice Assessment: Hoarseness on exam today Multiple intubations when hospitalized recently Speech therapy has informed patient to follow-up with ENT if hoarseness persists greater than 30 days  Plan: Keep recommendations from speech therapy if hoarseness does not improve     Lauraine Rinne, NP 04/25/2018   This appointment was 45 minutes long with over 50% of the time in direct face-to-face patient care, assessment, plan of care, and follow-up.

## 2018-04-26 ENCOUNTER — Telehealth: Payer: Self-pay | Admitting: Pulmonary Disease

## 2018-04-26 NOTE — Telephone Encounter (Signed)
Advised pt of results. Pt understood and nothing further is needed.    Notes recorded by Coral Ceo, NP on 04/25/2018 at 4:43 PM EST Your chest x-ray results of come back. Showing no acute changes. No plan of care changes at this time. Keep follow-up appointment.   Follow-up with our office if symptoms worsen or you do not feel like you are improving under her current regimen.  It was a pleasure taking care of you,  Lindsay Headland, FNP

## 2018-04-29 ENCOUNTER — Encounter (HOSPITAL_COMMUNITY): Payer: Self-pay | Admitting: Licensed Clinical Social Worker
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
## 2018-05-02 ENCOUNTER — Ambulatory Visit (HOSPITAL_COMMUNITY): Payer: PRIVATE HEALTH INSURANCE | Admitting: Licensed Clinical Social Worker

## 2018-05-09 ENCOUNTER — Encounter (HOSPITAL_COMMUNITY): Payer: Self-pay | Admitting: Licensed Clinical Social Worker

## 2018-05-09 ENCOUNTER — Ambulatory Visit (INDEPENDENT_AMBULATORY_CARE_PROVIDER_SITE_OTHER): Payer: PRIVATE HEALTH INSURANCE | Admitting: Licensed Clinical Social Worker

## 2018-05-09 DIAGNOSIS — F1320 Sedative, hypnotic or anxiolytic dependence, uncomplicated: Secondary | ICD-10-CM

## 2018-05-09 DIAGNOSIS — F603 Borderline personality disorder: Secondary | ICD-10-CM

## 2018-05-09 NOTE — Progress Notes (Signed)
   THERAPIST PROGRESS NOTE  Session Time: 10:30-11:30  Participation Level: Active  Behavioral Response: CasualAlertIrritable  Type of Therapy: Individual Therapy  Treatment Goals addressed: Anger  Interventions: DBT and Motivational Interviewing  Summary: Lindsay Lara is a 18 y.o. female who presents with Borderline Personality Disorder and Xanax Use Disorder, Severe. Her mood is stable though irritated, makes good eye contact, appropriate affect. Pt complains about her mother and states she feels her mother is a diagnosable narcissist. Counselor spends time confronting PT about her own desires, her own feels of lack of control of herself, and pointing out victim mentality. PT asks counselor to tell her mother she "should not move to [place] w/ her". Counselor states he is not willing to do this since he does not believe it will help her therapy. PT denies any substance use since last meeting. She states it has not been hard to stay sober. She plans to move to [place] for 90 days to enter treatment at "the [facility]".  Suicidal/Homicidal: Nowithout intent/plan  Therapist Response: Counselor used cognitive challenging, open questions, labeling of thoughts.  Plan: Return again in 1 weeks.  Diagnosis:    ICD-10-CM   1. 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 Borderline personality disorder (HCC) F60.3   .   Margo Common, LCAS-A 05/09/2018

## 2018-05-21 ENCOUNTER — Other Ambulatory Visit: Payer: Self-pay

## 2018-05-21 ENCOUNTER — Ambulatory Visit (INDEPENDENT_AMBULATORY_CARE_PROVIDER_SITE_OTHER): Payer: PRIVATE HEALTH INSURANCE | Admitting: Licensed Clinical Social Worker

## 2018-05-21 ENCOUNTER — Encounter (HOSPITAL_COMMUNITY): Payer: Self-pay | Admitting: Licensed Clinical Social Worker

## 2018-05-21 DIAGNOSIS — F1320 Sedative, hypnotic or anxiolytic dependence, uncomplicated: Secondary | ICD-10-CM | POA: Diagnosis not present

## 2018-05-21 DIAGNOSIS — F603 Borderline personality disorder: Secondary | ICD-10-CM | POA: Diagnosis not present

## 2018-05-21 NOTE — Progress Notes (Signed)
   THERAPIST PROGRESS NOTE  Session Time: 2:30-3:30  Participation Level: Minimal  Behavioral Response: Fairly GroomedDrowsyIrritable  Type of Therapy: Individual Therapy  Treatment Goals addressed: Diagnosis: BPD, Xanax Use Disorder, Severe  Interventions: Motivational Interviewing  Summary: Lindsay Lara is a 18 y.o. female who presents with long hx of substance use disorders, and Borderline Personality Disorder. She appears today for an individual session to assess her entry into the CD-IOP. She has flat affect, smiles incongruently, and has slowed speech and eye blinks. She states she is tired from not sleeping until 5AM this morning. PT states she is not excited about IOP and does not have an interest in it. She admits she was not able to complete residential tx, as scheduled due to the facility closing spontaneously 5 days into her tx there. Counselor recommends to PT that she continue in another residential program since she "was enjoying parts of it". PT states she has no interest in this and wants to be "left alone to take care of herself". Counselor confronts PT, asking her, is she "really in a place to make her own health decisions, given her past lies and deception regarding her relapses". PT states, "yes". Counselor then asks Pt how she would like to proceed. PT states she will "need to get another therapist". PT asks counselor to call her father and notify him of this plan and that she will not be be doing CD-IOP. PT and counselor call her father on speaker phone. PT becomes irritated at counselor and states "I guess I'm too 'high risk' for CD-IOP". PT explains that, according to ASAM criteria, PT is not likely to succeed in CD-IOP and she would be considered a good candidate if she ocmpleted a 2 or 4 week residential program first. PT verbalizes understanding but denies this recommendation. Counselor tells PT's father he will email recommendations for residential treatment centers.   Counselor asks PT if she is feeling suicidal which PT flat out denies.   Suicidal/Homicidal: Nowithout intent/plan  Therapist Response: Counselor used closed questions, confrontation, and frank, honest reflection to help PT avoid manipulative tendencies.  Plan: D/C and refer to higher level of care until completed.  Diagnosis:    ICD-10-CM   1. 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 Borderline personality disorder [facility]) F60.3       Margo Common, LCAS-A 05/21/2018

## 2018-06-17 ENCOUNTER — Ambulatory Visit: Payer: Self-pay | Admitting: Pulmonary Disease

## 2018-08-27 ENCOUNTER — Other Ambulatory Visit: Payer: Self-pay

## 2018-08-27 ENCOUNTER — Ambulatory Visit (HOSPITAL_COMMUNITY)
Admission: EM | Admit: 2018-08-27 | Discharge: 2018-08-27 | Disposition: A | Discharge status: Home / Self Care | Payer: No Typology Code available for payment source | Attending: Emergency Medicine | Admitting: Emergency Medicine

## 2018-08-27 ENCOUNTER — Encounter (HOSPITAL_COMMUNITY): Payer: Self-pay

## 2018-08-27 ENCOUNTER — Ambulatory Visit (HOSPITAL_COMMUNITY)
Admission: RE | Admit: 2018-08-27 | Discharge: 2018-08-27 | Disposition: A | Discharge status: Home / Self Care | Payer: PRIVATE HEALTH INSURANCE | Source: Home / Self Care | Attending: Psychiatry | Admitting: Psychiatry

## 2018-08-27 ENCOUNTER — Emergency Department (HOSPITAL_COMMUNITY)
Admission: EM | Admit: 2018-08-27 | Discharge: 2018-08-28 | Disposition: A | Discharge status: Other Acute Inpatient Hospital | Payer: PRIVATE HEALTH INSURANCE | Attending: Emergency Medicine | Admitting: Emergency Medicine

## 2018-08-27 DIAGNOSIS — Z046 Encounter for general psychiatric examination, requested by authority: Secondary | ICD-10-CM | POA: Diagnosis not present

## 2018-08-27 DIAGNOSIS — T50902A Poisoning by unspecified drugs, medicaments and biological substances, intentional self-harm, initial encounter: Secondary | ICD-10-CM | POA: Diagnosis not present

## 2018-08-27 DIAGNOSIS — T7421XA Adult sexual abuse, confirmed, initial encounter: Secondary | ICD-10-CM | POA: Insufficient documentation

## 2018-08-27 DIAGNOSIS — Z20828 Contact with and (suspected) exposure to other viral communicable diseases: Secondary | ICD-10-CM | POA: Diagnosis not present

## 2018-08-27 DIAGNOSIS — Z7289 Other problems related to lifestyle: Secondary | ICD-10-CM | POA: Insufficient documentation

## 2018-08-27 DIAGNOSIS — F1910 Other psychoactive substance abuse, uncomplicated: Secondary | ICD-10-CM | POA: Diagnosis not present

## 2018-08-27 DIAGNOSIS — F17210 Nicotine dependence, cigarettes, uncomplicated: Secondary | ICD-10-CM | POA: Insufficient documentation

## 2018-08-27 DIAGNOSIS — Z91040 Latex allergy status: Secondary | ICD-10-CM | POA: Diagnosis not present

## 2018-08-27 DIAGNOSIS — F329 Major depressive disorder, single episode, unspecified: Secondary | ICD-10-CM | POA: Diagnosis not present

## 2018-08-27 DIAGNOSIS — Z79899 Other long term (current) drug therapy: Secondary | ICD-10-CM | POA: Insufficient documentation

## 2018-08-27 DIAGNOSIS — R441 Visual hallucinations: Secondary | ICD-10-CM | POA: Diagnosis present

## 2018-08-27 DIAGNOSIS — Z0441 Encounter for examination and observation following alleged adult rape: Secondary | ICD-10-CM | POA: Diagnosis not present

## 2018-08-27 LAB — CBC
HCT: 42.3 % (ref 36.0–46.0)
Hemoglobin: 13.5 g/dL (ref 12.0–15.0)
MCH: 29.2 pg (ref 26.0–34.0)
MCHC: 31.9 g/dL (ref 30.0–36.0)
MCV: 91.6 fL (ref 80.0–100.0)
Platelets: 381 10*3/uL (ref 150–400)
RBC: 4.62 MIL/uL (ref 3.87–5.11)
RDW: 15.4 % (ref 11.5–15.5)
WBC: 9.3 10*3/uL (ref 4.0–10.5)
nRBC: 0 % (ref 0.0–0.2)

## 2018-08-27 LAB — COMPREHENSIVE METABOLIC PANEL
ALT: 28 U/L (ref 0–44)
AST: 44 U/L — ABNORMAL HIGH (ref 15–41)
Albumin: 4.4 g/dL (ref 3.5–5.0)
Alkaline Phosphatase: 67 U/L (ref 38–126)
Anion gap: 13 (ref 5–15)
BUN: 9 mg/dL (ref 6–20)
CO2: 24 mmol/L (ref 22–32)
Calcium: 9.2 mg/dL (ref 8.9–10.3)
Chloride: 102 mmol/L (ref 98–111)
Creatinine, Ser: 0.84 mg/dL (ref 0.44–1.00)
GFR calc Af Amer: >60 mL/min (ref >60)
GFR calc non Af Amer: >60 mL/min (ref >60)
Glucose, Bld: 104 mg/dL — ABNORMAL HIGH (ref 70–99)
Potassium: 3.9 mmol/L (ref 3.5–5.1)
Sodium: 139 mmol/L (ref 135–145)
Total Bilirubin: 0.2 mg/dL — ABNORMAL LOW (ref 0.3–1.2)
Total Protein: 8.1 g/dL (ref 6.5–8.1)

## 2018-08-27 LAB — RAPID URINE DRUG SCREEN, HOSP PERFORMED
Amphetamines: NOT DETECTED
Barbiturates: NOT DETECTED
Benzodiazepines: POSITIVE — AB
Cocaine: POSITIVE — AB
Opiates: NOT DETECTED
Tetrahydrocannabinol: POSITIVE — AB

## 2018-08-27 LAB — I-STAT BETA HCG BLOOD, ED (MC, WL, AP ONLY): I-stat hCG, quantitative: <5 m[IU]/mL (ref <5)

## 2018-08-27 LAB — ETHANOL: Alcohol, Ethyl (B): <10 mg/dL (ref <10)

## 2018-08-27 LAB — SALICYLATE LEVEL: Salicylate Lvl: <7 mg/dL (ref 2.8–30.0)

## 2018-08-27 LAB — ACETAMINOPHEN LEVEL: Acetaminophen (Tylenol), Serum: <10 ug/mL — ABNORMAL LOW (ref 10–30)

## 2018-08-27 MED ORDER — SODIUM CHLORIDE 0.9 % IV BOLUS
1000.0000 mL | Freq: Once | INTRAVENOUS | Status: AC
  Administered 2018-08-27: 15:00:00 1000 mL via INTRAVENOUS

## 2018-08-27 MED ORDER — LIDOCAINE HCL (PF) 1 % IJ SOLN
0.9000 mL | Freq: Once | INTRAMUSCULAR | Status: AC
  Administered 2018-08-27: 22:00:00 0.9 mL
  Filled 2018-08-27: qty 30

## 2018-08-27 MED ORDER — AZITHROMYCIN 250 MG PO TABS
1000.0000 mg | ORAL_TABLET | Freq: Once | ORAL | Status: AC
  Administered 2018-08-27: 1000 mg via ORAL
  Filled 2018-08-27: qty 4

## 2018-08-27 MED ORDER — METRONIDAZOLE 500 MG PO TABS
2000.0000 mg | ORAL_TABLET | Freq: Once | ORAL | Status: AC
  Administered 2018-08-27: 2000 mg via ORAL
  Filled 2018-08-27: qty 4

## 2018-08-27 MED ORDER — ONDANSETRON HCL 4 MG/2ML IJ SOLN
4.0000 mg | Freq: Once | INTRAMUSCULAR | Status: AC
  Administered 2018-08-28: 4 mg via INTRAVENOUS
  Filled 2018-08-27: qty 2

## 2018-08-27 MED ORDER — CEFTRIAXONE SODIUM 250 MG IJ SOLR
250.0000 mg | Freq: Once | INTRAMUSCULAR | Status: AC
  Administered 2018-08-27: 250 mg via INTRAMUSCULAR
  Filled 2018-08-27: qty 250

## 2018-08-27 MED ORDER — ONDANSETRON HCL 4 MG/2ML IJ SOLN
4.0000 mg | Freq: Once | INTRAMUSCULAR | Status: AC
  Administered 2018-08-27: 20:00:00 4 mg via INTRAVENOUS
  Filled 2018-08-27: qty 2

## 2018-08-27 NOTE — ED Notes (Signed)
Bed: WA01 Expected date:  Expected time:  Means of arrival:  Comments: EMS [facility] 18 yo xanax, valium, A&Ox4
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
## 2018-08-27 NOTE — ED Notes (Addendum)
Father: [phone] NEEDS to be contacted with updates

## 2018-08-27 NOTE — ED Notes (Signed)
Jewelry taken off and placed in cup. One ring, 6 bracelets and 1 hairbow placed in cup inside of personal belonging bag
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
## 2018-08-27 NOTE — ED Notes (Signed)
Found bottle of unknown pills on patients room. Patient stated she didn't take anything in the bottle.

## 2018-08-27 NOTE — ED Notes (Signed)
Spoke with father and he insists the patient be placed in [facility] for drug problem
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
## 2018-08-28 ENCOUNTER — Other Ambulatory Visit: Payer: Self-pay

## 2018-08-28 ENCOUNTER — Inpatient Hospital Stay (HOSPITAL_COMMUNITY)
Admission: AD | Admit: 2018-08-28 | Discharge: 2018-08-29 | DRG: 918 | Disposition: A | Discharge status: Other Acute Inpatient Hospital | Payer: PRIVATE HEALTH INSURANCE | Attending: Emergency Medicine | Admitting: Emergency Medicine

## 2018-08-28 ENCOUNTER — Encounter (HOSPITAL_COMMUNITY): Payer: Self-pay

## 2018-08-28 ENCOUNTER — Encounter (HOSPITAL_COMMUNITY): Payer: Self-pay | Admitting: Registered Nurse

## 2018-08-28 DIAGNOSIS — R402362 Coma scale, best motor response, obeys commands, at arrival to emergency department: Secondary | ICD-10-CM | POA: Diagnosis present

## 2018-08-28 DIAGNOSIS — Z811 Family history of alcohol abuse and dependence: Secondary | ICD-10-CM | POA: Diagnosis not present

## 2018-08-28 DIAGNOSIS — Z8659 Personal history of other mental and behavioral disorders: Secondary | ICD-10-CM | POA: Diagnosis not present

## 2018-08-28 DIAGNOSIS — Z79899 Other long term (current) drug therapy: Secondary | ICD-10-CM

## 2018-08-28 DIAGNOSIS — Y92230 Patient room in hospital as the place of occurrence of the external cause: Secondary | ICD-10-CM | POA: Diagnosis present

## 2018-08-28 DIAGNOSIS — F319 Bipolar disorder, unspecified: Secondary | ICD-10-CM | POA: Diagnosis present

## 2018-08-28 DIAGNOSIS — T424X2A Poisoning by benzodiazepines, intentional self-harm, initial encounter: Secondary | ICD-10-CM | POA: Diagnosis not present

## 2018-08-28 DIAGNOSIS — T424X4A Poisoning by benzodiazepines, undetermined, initial encounter: Secondary | ICD-10-CM

## 2018-08-28 DIAGNOSIS — F1910 Other psychoactive substance abuse, uncomplicated: Secondary | ICD-10-CM | POA: Diagnosis not present

## 2018-08-28 DIAGNOSIS — F332 Major depressive disorder, recurrent severe without psychotic features: Secondary | ICD-10-CM

## 2018-08-28 DIAGNOSIS — Z813 Family history of other psychoactive substance abuse and dependence: Secondary | ICD-10-CM

## 2018-08-28 DIAGNOSIS — R402252 Coma scale, best verbal response, oriented, at arrival to emergency department: Secondary | ICD-10-CM | POA: Diagnosis present

## 2018-08-28 DIAGNOSIS — R45851 Suicidal ideations: Secondary | ICD-10-CM | POA: Diagnosis not present

## 2018-08-28 DIAGNOSIS — Z915 Personal history of self-harm: Secondary | ICD-10-CM | POA: Diagnosis not present

## 2018-08-28 DIAGNOSIS — Z87891 Personal history of nicotine dependence: Secondary | ICD-10-CM

## 2018-08-28 DIAGNOSIS — R402142 Coma scale, eyes open, spontaneous, at arrival to emergency department: Secondary | ICD-10-CM | POA: Diagnosis present

## 2018-08-28 DIAGNOSIS — F419 Anxiety disorder, unspecified: Secondary | ICD-10-CM | POA: Diagnosis present

## 2018-08-28 LAB — COMPREHENSIVE METABOLIC PANEL
ALT: 24 U/L (ref 0–44)
AST: 28 U/L (ref 15–41)
Albumin: 3.8 g/dL (ref 3.5–5.0)
Alkaline Phosphatase: 52 U/L (ref 38–126)
Anion gap: 10 (ref 5–15)
BUN: 7 mg/dL (ref 6–20)
CO2: 19 mmol/L — ABNORMAL LOW (ref 22–32)
Calcium: 8.4 mg/dL — ABNORMAL LOW (ref 8.9–10.3)
Chloride: 111 mmol/L (ref 98–111)
Creatinine, Ser: 0.73 mg/dL (ref 0.44–1.00)
GFR calc Af Amer: >60 mL/min (ref >60)
GFR calc non Af Amer: >60 mL/min (ref >60)
Glucose, Bld: 87 mg/dL (ref 70–99)
Potassium: 4 mmol/L (ref 3.5–5.1)
Sodium: 140 mmol/L (ref 135–145)
Total Bilirubin: 0.1 mg/dL — ABNORMAL LOW (ref 0.3–1.2)
Total Protein: 6.8 g/dL (ref 6.5–8.1)

## 2018-08-28 LAB — SALICYLATE LEVEL: Salicylate Lvl: <7 mg/dL (ref 2.8–30.0)

## 2018-08-28 LAB — SARS CORONAVIRUS 2 BY RT PCR (HOSPITAL ORDER, PERFORMED IN ~~LOC~~ HOSPITAL LAB): SARS Coronavirus 2: NEGATIVE

## 2018-08-28 LAB — ACETAMINOPHEN LEVEL: Acetaminophen (Tylenol), Serum: <10 ug/mL — ABNORMAL LOW (ref 10–30)

## 2018-08-28 MED ORDER — HYDROXYZINE HCL 25 MG PO TABS
25.0000 mg | ORAL_TABLET | Freq: Four times a day (QID) | ORAL | Status: DC | PRN
  Administered 2018-08-28 (×2): 25 mg via ORAL
  Filled 2018-08-28 (×2): qty 1

## 2018-08-28 MED ORDER — SODIUM CHLORIDE 0.9 % IV BOLUS
1000.0000 mL | Freq: Once | INTRAVENOUS | Status: AC
  Administered 2018-08-28: 01:00:00 1000 mL via INTRAVENOUS

## 2018-08-28 MED ORDER — SERTRALINE HCL 50 MG PO TABS
50.0000 mg | ORAL_TABLET | Freq: Every day | ORAL | Status: DC
  Filled 2018-08-28: qty 1

## 2018-08-28 MED ORDER — SERTRALINE HCL 50 MG PO TABS
50.0000 mg | ORAL_TABLET | Freq: Every day | ORAL | Status: DC
  Administered 2018-08-28: 14:00:00 50 mg via ORAL
  Filled 2018-08-28: qty 1

## 2018-08-28 MED ORDER — QUETIAPINE FUMARATE 300 MG PO TABS
300.0000 mg | ORAL_TABLET | Freq: Every day | ORAL | Status: DC
  Filled 2018-08-28: qty 1

## 2018-08-28 MED ORDER — VALACYCLOVIR HCL 500 MG PO TABS
1000.0000 mg | ORAL_TABLET | Freq: Every day | ORAL | Status: DC
  Filled 2018-08-28: qty 2

## 2018-08-28 MED ORDER — QUETIAPINE FUMARATE 300 MG PO TABS: 300.0000 mg | ORAL_TABLET | Freq: Every day | ORAL | Status: DC

## 2018-08-28 MED ORDER — LORAZEPAM 1 MG PO TABS: 1.0000 mg | ORAL_TABLET | Freq: Four times a day (QID) | ORAL | Status: DC | PRN

## 2018-08-28 MED ORDER — TRAZODONE HCL 50 MG PO TABS: 50.0000 mg | ORAL_TABLET | Freq: Every evening | ORAL | Status: DC | PRN

## 2018-08-28 MED ORDER — VALACYCLOVIR HCL 500 MG PO TABS
1000.0000 mg | ORAL_TABLET | Freq: Every day | ORAL | Status: DC
  Administered 2018-08-28: 14:00:00 1000 mg via ORAL
  Filled 2018-08-28: qty 2

## 2018-08-28 MED ORDER — HYDROXYZINE HCL 25 MG PO TABS: 25.0000 mg | ORAL_TABLET | Freq: Four times a day (QID) | ORAL | Status: DC | PRN
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
## 2018-08-28 NOTE — ED Provider Notes (Signed)
6:30 AM Patient care assumed from [facility], PA-C at shift change.  Patient currently under IVC following intentional overdose.  Labs reviewed and patient medically cleared.    She has been assessed by TTS who recommend overnight observation and stabilization with psychiatric assessment in the morning. VSS.   Today's Vitals   08/28/18 0100 08/28/18 0302 08/28/18 0409 08/28/18 0558  BP: 107/82 (!) 122/96  129/79  Pulse: 74 90  99  Resp:  (!) 30  20  Temp:    98.8 F (37.1 C)  TempSrc:    Oral  SpO2: 99% 100%  95%  PainSc:   0-No pain      Antonietta Breach, PA-C 08/28/18 3159    Orpah Greek, MD 08/28/18 [phone]

## 2018-08-28 NOTE — Progress Notes (Signed)
Patient was given clothes since the shirt she's wearing is inappropriate. Patient seems to think her clothes are at a different hospital since they are not in her locker.
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
## 2018-08-28 NOTE — ED Notes (Addendum)
Patient remains asleep.  Psychiatrist and nurse practitioner in room to try and assess patient but patient would not wake up.

## 2018-08-28 NOTE — ED Notes (Signed)
Pt alert and oriented. Pt denies si or hi. Pt c/o of visual hallucinations, pt reports she has bugs crawling on her arms. Pt contract to safety. Pt reports that she's does not want to go home or to a inpatient psych facility. Pt sitter outside of room, pt safe will continue to monitor.

## 2018-08-28 NOTE — Consult Note (Signed)
This provider and Dr. Mallie Darting attempted to assess Lindsay Lara 18 y.o. female face to face but unable to wake her.  Nursing tech reports that patient was up all night.  Dr. Mallie Darting familiar with patient previous history of suicide attempt on ventilator prior to psychiatric hospitalization.  After reviewing patient chart patient recommended for psychiatric hospitalization related to current complaints of suicide attempt and previous patient psychiatric history of polysubstance abuse and suicide attempt  Medications started: Ativan detox CWIA greater than 10 started  Disposition: Recommend psychiatric Inpatient admission when medically cleared.   Shuvon B. Rankin, NP

## 2018-08-28 NOTE — Tx Team (Signed)
Initial Treatment Plan 08/28/2018 6:54 PM [name] YPP:[id number]    PATIENT STRESSORS: Financial difficulties Health problems Marital or family conflict Occupational concerns Substance abuse Traumatic event   PATIENT STRENGTHS: Ability for insight Communication skills Motivation for treatment/growth   PATIENT IDENTIFIED PROBLEMS: Substance abuse  Anxiety  "Get out of unhealthy relationship"  "Have a better relationship with my family"               DISCHARGE CRITERIA:  Ability to meet basic life and health needs Adequate post-discharge living arrangements Improved stabilization in mood, thinking, and/or behavior Medical problems require only outpatient monitoring Reduction of life-threatening or endangering symptoms to within safe limits Verbal commitment to aftercare and medication compliance  PRELIMINARY DISCHARGE PLAN: Attend aftercare/continuing care group Attend PHP/IOP Attend 12-step recovery group Outpatient therapy Placement in alternative living arrangements  PATIENT/FAMILY INVOLVEMENT: This treatment plan has been presented to and reviewed with the patient, Lindsay Lara, and/or family member.  The patient and family have been given the opportunity to ask questions and make suggestions.  Wolfgang Phoenix, RN 08/28/2018, 6:54 PM

## 2018-08-28 NOTE — BH Assessment (Signed)
Clinician attempted to complete assessment by calling [facility] at [phone] and [phone] but there was no answer on the unit for either phone call. Will attempt assessment at a later time.

## 2018-08-28 NOTE — ED Notes (Addendum)
Patient remains asleep.  Breakfast tray at bedside.  Sitter watching patient.  Patient very demanding, loud and agitated during the night according to sitter.  Patient safe will continue to monitor.
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
## 2018-08-28 NOTE — ED Notes (Signed)
Pt off unit to [facility] per provider.  Pt alert, calm, cooperative, no s/s of distress at time of transfer. Pt DC information given to [employer] for [facility]. Pt belongings given to [employer] for [facility]. Pt ambulatory off unit. Pt transported by [employer].

## 2018-08-28 NOTE — ED Triage Notes (Signed)
Pt BIB [facility] from [facility], per staff pt took "maybe 8 bars" of   xanax that she had in her vaginal canal. Pt unwilling to answer questions at this time, refusing blood work.
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
## 2018-08-28 NOTE — BH Assessment (Addendum)
[facility] Assessment Progress Note  Per Myles Lipps, MD, this pt requires psychiatric hospitalization.  Letitia Libra, RN, [facility] has assigned pt to [facility] Rm 305-2; [place] will be ready to receive pt at 17:00.  Pt presents under IVC initiated by EDP Joseph Berkshire, MD, and IVC documents have been faxed to [facility].  Pt's nurse, Eustaquio Maize, has been notified, and agrees to call report to [phone].  Pt is to be transported via Event organiser.   Jalene Mullet, [place] Coordinator [phone]

## 2018-08-29 ENCOUNTER — Inpatient Hospital Stay (HOSPITAL_COMMUNITY)
Admission: AD | Admit: 2018-08-29 | Discharge: 2018-09-02 | DRG: 885 | Disposition: A | Discharge status: Home / Self Care | Payer: PRIVATE HEALTH INSURANCE | Source: Intra-hospital | Attending: Psychiatry | Admitting: Psychiatry

## 2018-08-29 DIAGNOSIS — D649 Anemia, unspecified: Secondary | ICD-10-CM | POA: Diagnosis present

## 2018-08-29 DIAGNOSIS — F10239 Alcohol dependence with withdrawal, unspecified: Secondary | ICD-10-CM | POA: Diagnosis present

## 2018-08-29 DIAGNOSIS — Z87891 Personal history of nicotine dependence: Secondary | ICD-10-CM | POA: Diagnosis not present

## 2018-08-29 DIAGNOSIS — B009 Herpesviral infection, unspecified: Secondary | ICD-10-CM | POA: Diagnosis present

## 2018-08-29 DIAGNOSIS — F332 Major depressive disorder, recurrent severe without psychotic features: Secondary | ICD-10-CM | POA: Diagnosis present

## 2018-08-29 DIAGNOSIS — F319 Bipolar disorder, unspecified: Principal | ICD-10-CM | POA: Diagnosis present

## 2018-08-29 DIAGNOSIS — T424X1A Poisoning by benzodiazepines, accidental (unintentional), initial encounter: Secondary | ICD-10-CM | POA: Diagnosis present

## 2018-08-29 DIAGNOSIS — I10 Essential (primary) hypertension: Secondary | ICD-10-CM | POA: Diagnosis present

## 2018-08-29 DIAGNOSIS — F419 Anxiety disorder, unspecified: Secondary | ICD-10-CM | POA: Diagnosis present

## 2018-08-29 LAB — I-STAT BETA HCG BLOOD, ED (MC, WL, AP ONLY): I-stat hCG, quantitative: <5 m[IU]/mL (ref <5)

## 2018-08-29 LAB — CBC
HCT: 42.9 % (ref 36.0–46.0)
Hemoglobin: 12.6 g/dL (ref 12.0–15.0)
MCH: 29.6 pg (ref 26.0–34.0)
MCHC: 29.4 g/dL — ABNORMAL LOW (ref 30.0–36.0)
MCV: 100.7 fL — ABNORMAL HIGH (ref 80.0–100.0)
Platelets: 193 10*3/uL (ref 150–400)
RBC: 4.26 MIL/uL (ref 3.87–5.11)
RDW: 15.8 % — ABNORMAL HIGH (ref 11.5–15.5)
WBC: 9.7 10*3/uL (ref 4.0–10.5)
nRBC: 0 % (ref 0.0–0.2)

## 2018-08-29 LAB — RAPID URINE DRUG SCREEN, HOSP PERFORMED
Amphetamines: NOT DETECTED
Barbiturates: NOT DETECTED
Benzodiazepines: POSITIVE — AB
Cocaine: NOT DETECTED
Opiates: NOT DETECTED
Tetrahydrocannabinol: NOT DETECTED

## 2018-08-29 LAB — COMPREHENSIVE METABOLIC PANEL
ALT: 22 U/L (ref 0–44)
AST: 27 U/L (ref 15–41)
Albumin: 3.8 g/dL (ref 3.5–5.0)
Alkaline Phosphatase: 46 U/L (ref 38–126)
Anion gap: 11 (ref 5–15)
BUN: 5 mg/dL — ABNORMAL LOW (ref 6–20)
CO2: 23 mmol/L (ref 22–32)
Calcium: 9.2 mg/dL (ref 8.9–10.3)
Chloride: 107 mmol/L (ref 98–111)
Creatinine, Ser: 0.89 mg/dL (ref 0.44–1.00)
GFR calc Af Amer: >60 mL/min (ref >60)
GFR calc non Af Amer: >60 mL/min (ref >60)
Glucose, Bld: 82 mg/dL (ref 70–99)
Potassium: 4.3 mmol/L (ref 3.5–5.1)
Sodium: 141 mmol/L (ref 135–145)
Total Bilirubin: 0.7 mg/dL (ref 0.3–1.2)
Total Protein: 6.9 g/dL (ref 6.5–8.1)

## 2018-08-29 LAB — ACETAMINOPHEN LEVEL: Acetaminophen (Tylenol), Serum: <10 ug/mL — ABNORMAL LOW (ref 10–30)

## 2018-08-29 LAB — ETHANOL: Alcohol, Ethyl (B): <10 mg/dL (ref <10)

## 2018-08-29 LAB — SALICYLATE LEVEL: Salicylate Lvl: <7 mg/dL (ref 2.8–30.0)

## 2018-08-29 MED ORDER — LORAZEPAM 1 MG PO TABS
1.0000 mg | ORAL_TABLET | Freq: Four times a day (QID) | ORAL | Status: DC | PRN
  Administered 2018-08-29 – 2018-08-30 (×2): 1 mg via ORAL
  Filled 2018-08-29 (×2): qty 1

## 2018-08-29 MED ORDER — ALUM & MAG HYDROXIDE-SIMETH 200-200-20 MG/5ML PO SUSP: 30.0000 mL | ORAL | Status: DC | PRN

## 2018-08-29 MED ORDER — GABAPENTIN 300 MG PO CAPS
300.0000 mg | ORAL_CAPSULE | Freq: Three times a day (TID) | ORAL | Status: DC
  Administered 2018-08-29 – 2018-09-01 (×10): 300 mg via ORAL
  Filled 2018-08-29 (×16): qty 1

## 2018-08-29 MED ORDER — VALACYCLOVIR HCL 500 MG PO TABS
1000.0000 mg | ORAL_TABLET | Freq: Every day | ORAL | Status: DC
  Administered 2018-08-29 – 2018-09-01 (×4): 1000 mg via ORAL
  Filled 2018-08-29 (×7): qty 2

## 2018-08-29 MED ORDER — CARBAMAZEPINE 100 MG PO CHEW
200.0000 mg | CHEWABLE_TABLET | Freq: Three times a day (TID) | ORAL | Status: DC
  Administered 2018-08-29 – 2018-08-31 (×8): 200 mg via ORAL
  Filled 2018-08-29 (×13): qty 2

## 2018-08-29 MED ORDER — CLONAZEPAM 1 MG PO TABS
1.0000 mg | ORAL_TABLET | Freq: Two times a day (BID) | ORAL | Status: DC
  Administered 2018-08-29 – 2018-08-30 (×2): 1 mg via ORAL
  Filled 2018-08-29 (×2): qty 1

## 2018-08-29 MED ORDER — MAGNESIUM HYDROXIDE 400 MG/5ML PO SUSP: 30.0000 mL | Freq: Every day | ORAL | Status: DC | PRN

## 2018-08-29 MED ORDER — LOPERAMIDE HCL 2 MG PO CAPS: 2.0000 mg | ORAL_CAPSULE | ORAL | Status: AC | PRN

## 2018-08-29 MED ORDER — HYDROXYZINE HCL 25 MG PO TABS
25.0000 mg | ORAL_TABLET | Freq: Four times a day (QID) | ORAL | Status: AC | PRN
  Administered 2018-08-29 – 2018-08-31 (×5): 25 mg via ORAL
  Filled 2018-08-29 (×5): qty 1

## 2018-08-29 MED ORDER — ONDANSETRON 4 MG PO TBDP
4.0000 mg | ORAL_TABLET | Freq: Four times a day (QID) | ORAL | Status: AC | PRN
  Administered 2018-08-29: 4 mg via ORAL
  Filled 2018-08-29: qty 1

## 2018-08-29 MED ORDER — ACETAMINOPHEN 325 MG PO TABS
650.0000 mg | ORAL_TABLET | Freq: Four times a day (QID) | ORAL | Status: DC | PRN
  Administered 2018-08-30 – 2018-09-01 (×5): 650 mg via ORAL
  Filled 2018-08-29 (×4): qty 2

## 2018-08-29 NOTE — Progress Notes (Signed)
1:1 Note  Patient is in the quiet room on the 500 hall asleep. Breathings are even and unlabored.

## 2018-08-29 NOTE — Progress Notes (Signed)
1:1 Note  Patient is in the dayroom interacting with other patients appropriately. Patient still needs some redirection. Sitter is within arms reach. Patient is in no distress.

## 2018-08-29 NOTE — BHH Suicide Risk Assessment (Signed)
[facility] Admission Suicide Risk Assessment   Total Time spent with patient: 45 minutes Principal Problem: Chronic substance abuse/alprazolam overdose Diagnosis:  Active Problems:   Severe recurrent major depression without psychotic features (HCC)  Subjective Data: Patient had smuggled in alprazolam she had overdosed on this agent is at high risk for seizures and psychosis  Continued Clinical Symptoms:    The "Alcohol Use Disorders Identification Test", Guidelines for Use in Primary Care, Second Edition.  World Pharmacologist [facility]). Score between 0-7:  no or low risk or alcohol related problems. Score between 8-15:  moderate risk of alcohol related problems. Score between 16-19:  high risk of alcohol related problems. Score 20 or above:  warrants further diagnostic evaluation for alcohol dependence and treatment.   CLINICAL FACTORS:   Alcohol/Substance Abuse/Dependencies   Musculoskeletal: Strength & Muscle Tone: within normal limits Gait & Station: normal Patient leans: N/A  Psychiatric Specialty Exam: Physical Exam  Nursing note and vitals reviewed. Constitutional: She appears well-developed and well-nourished.  Cardiovascular: Normal rate.    Review of Systems  Constitutional: Negative.   Eyes: Negative.   Cardiovascular: Negative.   Genitourinary: Negative.   Musculoskeletal: Negative.   Neurological: Negative.     Last menstrual period 08/27/2018.There is no height or weight on file to calculate BMI.  General Appearance: Casual  Eye Contact:  Minimal  Speech:  Clear and Coherent  Volume:  Decreased  Mood:  Anxious and Irritable  Affect:  Restricted  Thought Process:  Coherent, Linear and Descriptions of Associations: Tangential  Orientation:  Full (Time, Place, and Person)  Thought Content:  Rumination  Suicidal Thoughts:  No  Homicidal Thoughts:  No  Memory:  Immediate;   Fair  Judgement:  Fair  Insight:  Shallow  Psychomotor Activity:  Decreased   Concentration:  Concentration: Poor  Recall:  Poor  Fund of Knowledge:  Poor  Language:  Poor  Akathisia:  Negative  Handed:  Right  AIMS (if indicated):     Assets:  Leisure Time Physical Health  ADL's:  Intact  Cognition:  WNL  Sleep:     Usually patient sluggish seemingly under the influence of her overdose later after the interview was alert and oriented to person place situation inquiring about the time of day and denying psychosis and thoughts of harming self or others    COGNITIVE FEATURES THAT CONTRIBUTE TO RISK:  None    SUICIDE RISK:   Moderate:  Frequent suicidal ideation with limited intensity, and duration, some specificity in terms of plans, no associated intent, good self-control, limited dysphoria/symptomatology, some risk factors present, and identifiable protective factors, including available and accessible social support.  PLAN OF CARE: Needs detox and stabilization  I certify that inpatient services furnished can reasonably be expected to improve the patient's condition.   Johnn Hai, MD 08/29/2018, 1:23 PM

## 2018-08-29 NOTE — ED Provider Notes (Signed)
[facility] EMERGENCY DEPARTMENT Provider Note   CSN: [id number] Arrival date & time: 08/28/18  2226    History   Chief Complaint Chief Complaint  Patient presents with  . Suicidal    HPI Lindsay Lara is a 18 y.o. female.     Patient sent to the emergency department from the behavioral health Hospital for drug overdose.  Patient is currently admitted to the behavioral health Hospital on an involuntary commitment.  She reportedly took multiple Xanax tablets tonight that she had hidden in her vagina.  At arrival to the ER, patient is disorganized and confused, cannot appropriately answer any questions. Level V Caveat due to mental status change.     Past Medical History:  Diagnosis Date  . Acne   . ADHD (attention deficit hyperactivity disorder) 05/08/2012  . Anxiety   . Bipolar and related disorder ([place]) 09/18/2015  . Depression   . 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 Pneumonia at age 33  . Polysubstance abuse ([place]) 09/18/2015  . Scoliosis no treatment required  . Substance induced mood disorder ([place]) 09/18/2015    Patient Active Problem List   Diagnosis Date Noted  . MDD (major depressive disorder), recurrent episode, severe ([place]) 08/28/2018  . Healthcare maintenance 04/25/2018  . Hoarseness of voice 04/25/2018  . PTSD (post-traumatic stress disorder) 04/20/2018  . Opioid dependence ([place]) 04/17/2018  . Sinus tachycardia 04/15/2018  . 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 Status post tracheostomy ([place])   . Suicide attempt ([place])   . ARDS (adult respiratory distress syndrome) ([place]) 04/07/2018  . Pressure injury of skin 03/29/2018  . Mucus plugging of bronchi   . Hypoxia   . Acute respiratory failure ([place])   . Intentional overdose of beta-adrenergic blocking drug ([place]) 03/21/2018  . MDD (major depressive disorder), recurrent severe, without psychosis ([place]) 02/07/2018  . 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  Overdose of benzodiazepine 01/14/2017  . Borderline personality disorder in adult [facility]) 01/14/2017  . Self-inflicted laceration of wrist/arms c/w known h/o "cutting" 01/14/2017  . 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 Contact dermatitis 11/12/2015  . 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 Chlamydia 09/21/2015  . Idiopathic scoliosis 05/05/2015  . Contraception 06/18/2012  . Suicidal ideation 05/08/2012  . ADHD (attention deficit hyperactivity disorder) 05/08/2012  . Oppositional defiant disorder 05/08/2012  . Parent-child relational problem 05/08/2012    History reviewed. No pertinent surgical history.   OB History    Gravida  0   Para  0   Term  0   Preterm  0   AB  0   Living  0     SAB  0   TAB  0   Ectopic  0   Multiple  0   Live Births               Home Medications    Prior to Admission medications   Medication Sig Start Date End Date Taking? Authorizing Provider  albuterol (VENTOLIN HFA) 108 (90 Base) MCG/ACT inhaler Inhale 2 puffs into the lungs every 6 (six) hours as needed for wheezing or shortness of breath.  08/03/18  Yes [provider]  ALPRAZolam (XANAX PO) Take 1-3 tablets by mouth once.    [provider]  BREO ELLIPTA 100-25 MCG/INH AEPB Inhale 1 puff into the lungs daily.  07/24/18   [provider]  citalopram (CELEXA) 40 MG tablet Take 40 mg by mouth daily.  [provider]  cloNIDine (CATAPRES) 0.2 MG tablet Take 0.2 mg by mouth 3 (three) times daily.  08/12/18   [provider]  folic acid (FOLVITE) 1 MG tablet Take 1 tablet (1 mg total) by mouth daily. (May buy from over the counter): For folate replacement Patient not taking: Reported on 08/27/2018 04/22/18   Armandina StammerNwoko, Agnes I, NP  gabapentin (NEURONTIN) 400 MG capsule Take 1 capsule (400 mg total) by mouth 2 (two) times daily. For agitation Patient not taking: Reported on 08/27/2018 04/22/18   Armandina StammerNwoko, Agnes I, NP  hydrOXYzine  (ATARAX/VISTARIL) 25 MG tablet Take 1 tablet (25 mg total) by mouth every 6 (six) hours as needed for anxiety. Patient not taking: Reported on 08/27/2018 04/22/18   Armandina StammerNwoko, Agnes I, NP  hydrOXYzine (ATARAX/VISTARIL) 50 MG tablet Take 50 mg by mouth 3 (three) times daily.  05/01/18   [provider]  KURVELO 0.15-30 MG-MCG tablet Take 1 tablet by mouth daily.  08/22/18   [provider]  lidocaine (XYLOCAINE) 2 % jelly  08/08/18   [provider]  LORazepam (ATIVAN PO) Take 1-3 tablets by mouth once.    [provider]  metoprolol tartrate (LOPRESSOR) 25 MG tablet Take 0.5 tablets (12.5 mg total) by mouth 2 (two) times daily. For high blood pressure Patient not taking: Reported on 04/25/2018 04/22/18   Armandina StammerNwoko, Agnes I, NP  QUEtiapine (SEROQUEL) 300 MG tablet Take 1 tablet (300 mg total) by mouth 2 (two) times daily. For mood control Patient not taking: Reported on 08/27/2018 04/22/18   Armandina StammerNwoko, Agnes I, NP  sertraline (ZOLOFT) 50 MG tablet Take 1 tablet (50 mg total) by mouth daily. For depression Patient not taking: Reported on 08/27/2018 04/22/18   Armandina StammerNwoko, Agnes I, NP  traZODone (DESYREL) 50 MG tablet Take 1 tablet (50 mg total) by mouth at bedtime as needed for sleep. Patient not taking: Reported on 04/25/2018 04/22/18   Armandina StammerNwoko, Agnes I, NP  valACYclovir (VALTREX) 1000 MG tablet Take 1 tablet (1,000 mg total) by mouth daily. For herpes infection 04/23/18   Sanjuana KavaNwoko, Agnes I, NP    Family History Family History  Problem Relation Age of Onset  . Alcoholism Father        and [place] grandfather  . Heart disease Father   . Hyperlipidemia Father   . Drug abuse Paternal Uncle     Social History Social History   Tobacco Use  . Smoking status: Former Smoker    Packs/day: 0.50    Years: 4.00    Pack years: 2.00    Types: Cigarettes    Quit date: 03/21/2018    Years since quitting: 0.4  . Smokeless tobacco: Never Used  Substance Use Topics  . Alcohol use: Yes    Comment:  occasional  . Drug use: Yes    Types: Marijuana, Benzodiazepines, Cocaine    Comment: acid, xanax     Allergies   Latex   Review of Systems Review of Systems  Unable to perform ROS: Mental status change     Physical Exam Updated Vital Signs BP 130/85 (BP Location: Right Arm)   Pulse 73   Temp 98.4 F (36.9 C) (Oral)   Resp 18   Ht 5\' 5"  (1.651 m)   Wt 56.7 kg   LMP 08/27/2018   SpO2 100%   BMI 20.80 kg/m   Physical Exam Vitals signs and nursing note reviewed.  Constitutional:      General: She is not in acute distress.  Appearance: Normal appearance. She is well-developed.  HENT:     Head: Normocephalic and atraumatic.     Right Ear: Hearing normal.     Left Ear: Hearing normal.     Nose: Nose normal.  Eyes:     Conjunctiva/sclera: Conjunctivae normal.     Pupils: Pupils are equal, round, and reactive to light.  Neck:     Musculoskeletal: Normal range of motion and neck supple.  Cardiovascular:     Rate and Rhythm: Regular rhythm.     Heart sounds: S1 normal and S2 normal. No murmur. No friction rub. No gallop.   Pulmonary:     Effort: Pulmonary effort is normal. No respiratory distress.     Breath sounds: Normal breath sounds.  Chest:     Chest wall: No tenderness.  Abdominal:     General: Bowel sounds are normal.     Palpations: Abdomen is soft.     Tenderness: There is no abdominal tenderness. There is no guarding or rebound. Negative signs include Murphy's sign and McBurney's sign.     Hernia: No hernia is present.  Genitourinary:    Comments: Active bleeding consistent with menstruation, no foreign bodies in vagina  Rectal exam normal, no foreign bodies Musculoskeletal: Normal range of motion.  Skin:    General: Skin is warm and dry.     Findings: No rash.  Neurological:     Mental Status: She is alert. She is disoriented.     GCS: GCS eye subscore is 4. GCS verbal subscore is 5. GCS motor subscore is 6.     Cranial Nerves: No cranial nerve  deficit.     Sensory: No sensory deficit.     Coordination: Coordination normal.  Psychiatric:        Mood and Affect: Affect is flat.        Speech: Speech is delayed and tangential.        Behavior: Behavior is withdrawn. Behavior is cooperative.      ED Treatments / Results  Labs (all labs ordered are listed, but only abnormal results are displayed) Labs Reviewed  COMPREHENSIVE METABOLIC PANEL - Abnormal; Notable for the following components:      Result Value   BUN 5 (*)    All other components within normal limits  ACETAMINOPHEN LEVEL - Abnormal; Notable for the following components:   Acetaminophen (Tylenol), Serum <10 (*)    All other components within normal limits  CBC - Abnormal; Notable for the following components:   MCV 100.7 (*)    MCHC 29.4 (*)    RDW 15.8 (*)    All other components within normal limits  RAPID URINE DRUG SCREEN, HOSP PERFORMED - Abnormal; Notable for the following components:   Benzodiazepines POSITIVE (*)    All other components within normal limits  ETHANOL  SALICYLATE LEVEL  I-STAT BETA HCG BLOOD, ED ([facility], [facility], [facility] ONLY)    EKG None  Radiology No results found.  Procedures Procedures (including critical care time)  Medications Ordered in ED Medications  hydrOXYzine (ATARAX/VISTARIL) tablet 25 mg (has no administration in time range)  QUEtiapine (SEROQUEL) tablet 300 mg (300 mg Oral Not Given 08/28/18 2200)  sertraline (ZOLOFT) tablet 50 mg (has no administration in time range)  traZODone (DESYREL) tablet 50 mg (has no administration in time range)  valACYclovir (VALTREX) tablet 1,000 mg (has no administration in time range)     Initial Impression / Assessment and Plan / ED Course  I have reviewed the  triage vital signs and the nursing notes.  Pertinent labs & imaging results that were available during my care of the patient were reviewed by me and considered in my medical decision making (see chart for details).         Patient sent to the emergency department for evaluation of overdose.  Patient is currently admitted to the behavioral health Hospital, somehow got hold of additional Xanax tonight.  One of the other patients at the behavioral health Hospital indicated that she had given him 2 Xanax tablets tonight that she took out of her vagina.  Pelvic and rectal exam performed, no more drugs in either orifice.  Patient monitored for 6 hours.  She has done well.  Vital signs have remained normal.  She appears somewhat disorganized but this is more consistent with mild psychosis, not intoxication.  She is easily arousable.  She will be appropriate for return to behavioral health for further psychiatric treatment.  Final Clinical Impressions(s) / ED Diagnoses   Final diagnoses:  Intentional benzodiazepine overdose, initial encounter [facility](HCC)    ED Discharge Orders    None       Shaunette Gassner, Canary Brimhristopher J, MD 08/29/18 0425

## 2018-08-29 NOTE — ED Notes (Signed)
Transported back to [facility] per [employer] sitter with patient

## 2018-08-29 NOTE — Progress Notes (Signed)
Upon initial assessment patient asleep and very difficult to awaken.  After much prompting from staff, patient awakened and was assisted into sitting position.  Pupils dilated, reactive to light.  She was disoriented to place and time and she extremely unsteady.  What appears to be crushed pill residue visible on the floor at the edge of the bed.  Another patient on the unit shared with provider that patient had given him 2 Xanax that she had hid in her vagina upon admission. Patient is unable to clearly state how much medication she had or how much she took.   Patient transferred via EMS to [facility].

## 2018-08-29 NOTE — ED Notes (Signed)
[employer] called to transport back to [facility], report called to [place] at [facility].

## 2018-08-29 NOTE — H&P (Signed)
Psychiatric Admission Assessment Adult  Patient Identification: Lindsay Lara MRN:  [id number] Date of Evaluation:  08/29/2018 Chief Complaint:  Poly substance abuse Principal Diagnosis: Alprazolam dependency and overusage denying it was a suicide attempt Diagnosis:  Active Problems:   Severe recurrent major depression without psychotic features (HCC)  History of Present Illness:  [name] is well-known to the service she is 18 years of age has an extensive history of polysubstance dependency and abuse/substance-induced mood disorder bipolar type/borderline personality disorder.  She is a long-term alprazolam abuser but states she had 5 months clean before her last 6066-month binge of intermittent alprazolam abuse.  She herself describes herself as "an alcoholic you can just have one drink" she describes taking 1 Xanax after another and therefore argues she was not in fact suicidal but again just overused the drug to get high. She had actually smuggled in a bottle of alprazolam and was dispensing into other patients on the 300 hall and therefore she was transferred to the 500 hall, further she has been placed on one-to-one precautions, and further problematic is her friendship with another patient on the other ward (who is since been discharged).  The patient herself denies that she needs detox however she was hallucinating on presentation which could indicate alprazolam intoxication versus withdrawal.  At any rate we will continue her detox regimen already started.  He denies current hallucinations denies thoughts of harming self or others Associated Signs/Symptoms: Depression Symptoms:  anhedonia, (Hypo) Manic Symptoms:  Hallucinations, Anxiety Symptoms:  Panic Symptoms, Psychotic Symptoms:  Hallucinations: Auditory PTSD Symptoms: NA Total Time spent with patient: 45 minutes  Past Psychiatric History: see past charge  Is the patient at risk to self? Yes.    Has the patient been a risk to  self in the past 6 months? Yes.    Has the patient been a risk to self within the distant past? Yes.    Is the patient a risk to others? No.  Has the patient been a risk to others in the past 6 months? No.  Has the patient been a risk to others within the distant past? No.   Prior Inpatient Therapy:   Prior Outpatient Therapy:    Alcohol Screening:   Substance Abuse History in the last 12 months:  Yes.   Consequences of Substance Abuse: Medical Consequences:  As above Previous Psychotropic Medications: Yes  Psychological Evaluations: No  Past Medical History:  Past Medical History:  Diagnosis Date  . Acne   . ADHD (attention deficit hyperactivity disorder) 05/08/2012  . Anxiety   . Bipolar and related disorder (HCC) 09/18/2015  . Depression   . 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 Pneumonia at age 89  . Polysubstance abuse (HCC) 09/18/2015  . Scoliosis no treatment required  . Substance induced mood disorder (HCC) 09/18/2015   No past surgical history on file. Family History:  Family History  Problem Relation Age of Onset  . Alcoholism Father        and Pater[place] grandfather  . Heart disease Father   . Hyperlipidemia Father   . Drug abuse Paternal Uncle    Family Psychiatric  History: ukn Tobacco Screening:   Social History:  Social History   Substance and Sexual Activity  Alcohol Use Yes   Comment: occasional     Social History   Substance and Sexual Activity  Drug Use Yes  . Types: Marijuana, Benzodiazepines, Cocaine   Comment: acid, xanax  Additional Social History:                           Allergies:   Allergies  Allergen Reactions  . Latex Other (See Comments)    Irritation    Lab Results:  Results for orders placed or performed during the hospital encounter of 08/28/18 (from the past 48 hour(s))  Comprehensive metabolic panel     Status: Abnormal   Collection Time: 08/28/18 11:50 PM  Result Value Ref Range   Sodium 141  135 - 145 mmol/L   Potassium 4.3 3.5 - 5.1 mmol/L   Chloride 107 98 - 111 mmol/L   CO2 23 22 - 32 mmol/L   Glucose, Bld 82 70 - 99 mg/dL   BUN 5 (L) 6 - 20 mg/dL   Creatinine, Ser 1.61 0.44 - 1.00 mg/dL   Calcium 9.2 8.9 - 09.6 mg/dL   Total Protein 6.9 6.5 - 8.1 g/dL   Albumin 3.8 3.5 - 5.0 g/dL   AST 27 15 - 41 U/L   ALT 22 0 - 44 U/L   Alkaline Phosphatase 46 38 - 126 U/L   Total Bilirubin 0.7 0.3 - 1.2 mg/dL   GFR calc non Af Amer >60 >60 mL/min   GFR calc Af Amer >60 >60 mL/min   Anion gap 11 5 - 15    Comment: Performed at [facility], [address] [place], [place] [address]  Ethanol     Status: None   Collection Time: 08/28/18 11:50 PM  Result Value Ref Range   Alcohol, Ethyl (B) <10 <10 mg/dL    Comment: (NOTE) Lowest detectable limit for serum alcohol is 10 mg/dL. For medical purposes only. Performed at [facility], [address], [place], [place] [address]   Salicylate level     Status: None   Collection Time: 08/28/18 11:50 PM  Result Value Ref Range   Salicylate Lvl <7.0 2.8 - 30.0 mg/dL    Comment: Performed at [facility], [address], [place], [place] [address]  Acetaminophen level     Status: Abnormal   Collection Time: 08/28/18 11:50 PM  Result Value Ref Range   Acetaminophen (Tylenol), Serum <10 (L) 10 - 30 ug/mL    Comment: (NOTE) Therapeutic concentrations vary significantly. A range of 10-30 ug/mL  may be an effective concentration for many patients. However, some  are best treated at concentrations outside of this range. Acetaminophen concentrations >150 ug/mL at 4 hours after ingestion  and >50 ug/mL at 12 hours after ingestion are often associated with  toxic reactions. Performed at [facility], [address], [place], [place] [address]   cbc     Status: Abnormal   Collection Time: 08/28/18 11:50 PM  Result Value Ref Range   WBC 9.7 4.0 - 10.5 K/uL   RBC 4.26 3.87 - 5.11 MIL/uL   Hemoglobin 12.6 12.0 -  15.0 g/dL   HCT 30.8 65.7 - 84.6 %   MCV 100.7 (H) 80.0 - 100.0 fL   MCH 29.6 26.0 - 34.0 pg   MCHC 29.4 (L) 30.0 - 36.0 g/dL   RDW 96.2 (H) 95.2 - 84.1 %   Platelets 193 150 - 400 K/uL   nRBC 0.0 0.0 - 0.2 %    Comment: Performed at [facility], [address], [place], [place] [address]  I-Stat beta hCG blood, ED     Status: None  Collection Time: 08/29/18 12:24 AM  Result Value Ref Range   I-stat hCG, quantitative <5.0 <5 mIU/mL   Comment 3            Comment:   GEST. AGE      CONC.  (mIU/mL)   <=1 WEEK        5 - 50     2 WEEKS       50 - 500     3 WEEKS       100 - 10,000     4 WEEKS     1,000 - 30,000        FEMALE AND NON-PREGNANT FEMALE:     LESS THAN 5 mIU/mL   Rapid urine drug screen (hospital performed)     Status: Abnormal   Collection Time: 08/29/18 12:36 AM  Result Value Ref Range   Opiates NONE DETECTED NONE DETECTED   Cocaine NONE DETECTED NONE DETECTED   Benzodiazepines POSITIVE (A) NONE DETECTED   Amphetamines NONE DETECTED NONE DETECTED   Tetrahydrocannabinol NONE DETECTED NONE DETECTED   Barbiturates NONE DETECTED NONE DETECTED    Comment: (NOTE) DRUG SCREEN FOR MEDICAL PURPOSES ONLY.  IF CONFIRMATION IS NEEDED FOR ANY PURPOSE, NOTIFY LAB WITHIN 5 DAYS. LOWEST DETECTABLE LIMITS FOR URINE DRUG SCREEN Drug Class                     Cutoff (ng/mL) Amphetamine and metabolites    1000 Barbiturate and metabolites    200 Benzodiazepine                 200 Tricyclics and metabolites     300 Opiates and metabolites        300 Cocaine and metabolites        300 THC                            50 Performed at [facility], [address] [place], [place] [address]     Blood Alcohol level:  Lab Results  Component Value Date   [facility] 08[facility]   ETH <10 08/27/2018    Metabolic Disorder Labs:  Lab Results  Component Value Date   HGBA1C 5.0 02/10/2018   MPG 96.8 02/10/2018   MPG 103 09/17/2015   No results found for: PROLACTIN Lab  Results  Component Value Date   CHOL 157 02/10/2018   TRIG 311 (H) 04/04/2018   HDL 47 02/10/2018   CHOLHDL 3.3 02/10/2018   VLDL 30 02/10/2018   LDLCALC 80 02/10/2018   LDLCALC 89 09/18/2015    Current Medications: Current Facility-Administered Medications  Medication Dose Route Frequency Provider Last Rate Last Dose  . acetaminophen (TYLENOL) tablet 650 mg  650 mg Oral Q6H PRN Jackelyn PolingBerry, Jason A, NP      . alum & mag hydroxide-simeth (MAALOX/MYLANTA) 200-200-20 MG/5ML suspension 30 mL  30 mL Oral Q4H PRN Nira ConnBerry, Jason A, NP      . carbamazepine (TEGRETOL) chewable tablet 200 mg  200 mg Oral TID Malvin JohnsFarah, Antionne Enrique, MD      . clonazePAM Scarlette Calico(KLONOPIN) tablet 1 mg  1 mg Oral BID Malvin JohnsFarah, Huldah Marin, MD      . gabapentin (NEURONTIN) capsule 300 mg  300 mg Oral TID Malvin JohnsFarah, Donovon Micheletti, MD      . hydrOXYzine (ATARAX/VISTARIL) tablet 25 mg  25 mg Oral Q6H PRN Nira ConnBerry, Jason A, NP      . loperamide (IMODIUM) capsule  2-4 mg  2-4 mg Oral PRN Nira ConnBerry, Jason A, NP      . LORazepam (ATIVAN) tablet 1 mg  1 mg Oral Q6H PRN Nira ConnBerry, Jason A, NP      . magnesium hydroxide (MILK OF MAGNESIA) suspension 30 mL  30 mL Oral Daily PRN Nira ConnBerry, Jason A, NP      . ondansetron (ZOFRAN-ODT) disintegrating tablet 4 mg  4 mg Oral Q6H PRN Jackelyn PolingBerry, Jason A, NP      . valACYclovir (VALTREX) tablet 1,000 mg  1,000 mg Oral Daily Nira ConnBerry, Jason A, NP       PTA Medications: Medications Prior to Admission  Medication Sig Dispense Refill Last Dose  . albuterol (VENTOLIN HFA) 108 (90 Base) MCG/ACT inhaler Inhale 2 puffs into the lungs every 6 (six) hours as needed for wheezing or shortness of breath.      . ALPRAZolam (XANAX PO) Take 1-3 tablets by mouth once.     Marland Kitchen. BREO ELLIPTA 100-25 MCG/INH AEPB Inhale 1 puff into the lungs daily.      . citalopram (CELEXA) 40 MG tablet Take 40 mg by mouth daily.     . cloNIDine (CATAPRES) 0.2 MG tablet Take 0.2 mg by mouth 3 (three) times daily.      . folic acid (FOLVITE) 1 MG tablet Take 1 tablet (1 mg total) by mouth  daily. (May buy from over the counter): For folate replacement (Patient not taking: Reported on 08/27/2018)     . gabapentin (NEURONTIN) 400 MG capsule Take 1 capsule (400 mg total) by mouth 2 (two) times daily. For agitation (Patient not taking: Reported on 08/27/2018) 60 capsule 0   . hydrOXYzine (ATARAX/VISTARIL) 25 MG tablet Take 1 tablet (25 mg total) by mouth every 6 (six) hours as needed for anxiety. (Patient not taking: Reported on 08/27/2018) 60 tablet 0   . hydrOXYzine (ATARAX/VISTARIL) 50 MG tablet Take 50 mg by mouth 3 (three) times daily.      Marland Kitchen. KURVELO 0.15-30 MG-MCG tablet Take 1 tablet by mouth daily.      Marland Kitchen. lidocaine (XYLOCAINE) 2 % jelly      . LORazepam (ATIVAN PO) Take 1-3 tablets by mouth once.     . metoprolol tartrate (LOPRESSOR) 25 MG tablet Take 0.5 tablets (12.5 mg total) by mouth 2 (two) times daily. For high blood pressure (Patient not taking: Reported on 04/25/2018) 60 tablet 0   . QUEtiapine (SEROQUEL) 300 MG tablet Take 1 tablet (300 mg total) by mouth 2 (two) times daily. For mood control (Patient not taking: Reported on 08/27/2018) 60 tablet 0   . sertraline (ZOLOFT) 50 MG tablet Take 1 tablet (50 mg total) by mouth daily. For depression (Patient not taking: Reported on 08/27/2018) 30 tablet 0   . traZODone (DESYREL) 50 MG tablet Take 1 tablet (50 mg total) by mouth at bedtime as needed for sleep. (Patient not taking: Reported on 04/25/2018) 30 tablet 0   . valACYclovir (VALTREX) 1000 MG tablet Take 1 tablet (1,000 mg total) by mouth daily. For herpes infection       Musculoskeletal: Strength & Muscle Tone: within normal limits Gait & Station: normal Patient leans: N/A  Psychiatric Specialty Exam: Physical Exam  Nursing note and vitals reviewed. Constitutional: She appears well-developed and well-nourished.  Cardiovascular: Normal rate.    Review of Systems  Constitutional: Negative.   Eyes: Negative.   Cardiovascular: Negative.   Genitourinary: Negative.    Musculoskeletal: Negative.   Neurological: Negative.     Last  menstrual period 08/27/2018.There is no height or weight on file to calculate BMI.  General Appearance: Casual  Eye Contact:  Minimal  Speech:  Clear and Coherent  Volume:  Decreased  Mood:  Anxious and Irritable  Affect:  Restricted  Thought Process:  Coherent, Linear and Descriptions of Associations: Tangential  Orientation:  Full (Time, Place, and Person)  Thought Content:  Rumination  Suicidal Thoughts:  No  Homicidal Thoughts:  No  Memory:  Immediate;   Fair  Judgement:  Fair  Insight:  Shallow  Psychomotor Activity:  Decreased  Concentration:  Concentration: Poor  Recall:  Poor  Fund of Knowledge:  Poor  Language:  Poor  Akathisia:  Negative  Handed:  Right  AIMS (if indicated):     Assets:  Leisure Time Physical Health  ADL's:  Intact  Cognition:  WNL  Sleep:     Usually patient sluggish seemingly under the influence of her overdose later after the interview was alert and oriented to person place situation inquiring about the time of day and denying psychosis and thoughts of harming self or others   Treatment Plan Summary: Daily contact with patient to assess and evaluate symptoms and progress in treatment and Medication management  Observation Level/Precautions:  15 minute checks  Laboratory:  UDS  Psychotherapy: Rehab based  Medications: Detox regimen  Consultations: Has been medically cleared  Discharge Concerns: Long-term sobriety and stability  Estimated LOS: 5-7  Other: Axis I substance-induced mood disorder bipolar type/alprazolam dependency and abuse/unintentional overdose due to overuse/borderline personality likely history of polysubstance abuse   Physician Treatment Plan for Primary Diagnosis: <principal problem not specified> Long Term Goal(s): Improvement in symptoms so as ready for discharge  Short Term Goals: Ability to verbalize feelings will improve, Ability to disclose and  discuss suicidal ideas, Ability to demonstrate self-control will improve and Ability to identify and develop effective coping behaviors will improve  Physician Treatment Plan for Secondary Diagnosis: Active Problems:   Severe recurrent major depression without psychotic features ([place])  Long Term Goal(s): Improvement in symptoms so as ready for discharge  Short Term Goals: Ability to identify and develop effective coping behaviors will improve, Ability to maintain clinical measurements within normal limits will improve and Compliance with prescribed medications will improve  I certify that inpatient services furnished can reasonably be expected to improve the patient's condition.    Johnn Hai, MD 6/25/20201:27 PM

## 2018-08-29 NOTE — Progress Notes (Signed)
Patient vitals upon returning to unit.  98.4, 83, 89, 120/89.  Patient placed on 1:1 due to being confused, unsteady and repeatedly taking clothes off.  Patient having difficulty following directions.

## 2018-08-29 NOTE — Progress Notes (Signed)
1:1 Note  Patient is in the quiet room on the 500 hall asleep. Breathings are even and unlabored. Sitter is within arms reach. Will continue to monitor.

## 2018-08-30 DIAGNOSIS — F332 Major depressive disorder, recurrent severe without psychotic features: Secondary | ICD-10-CM

## 2018-08-30 MED ORDER — TRAZODONE HCL 100 MG PO TABS
100.0000 mg | ORAL_TABLET | Freq: Every evening | ORAL | Status: DC | PRN
  Administered 2018-08-31 – 2018-09-01 (×3): 100 mg via ORAL
  Filled 2018-08-30 (×8): qty 1

## 2018-08-30 MED ORDER — CLONAZEPAM 0.5 MG PO TABS
0.5000 mg | ORAL_TABLET | Freq: Three times a day (TID) | ORAL | Status: AC
  Administered 2018-08-30 – 2018-08-31 (×4): 0.5 mg via ORAL
  Filled 2018-08-30 (×4): qty 1

## 2018-08-30 NOTE — Tx Team (Signed)
Interdisciplinary Treatment and Diagnostic Plan Update  08/30/2018 Time of Session:  Lindsay Lara MRN: [id number]  Principal Diagnosis: <principal problem not specified>  Secondary Diagnoses: Active Problems:   Severe recurrent major depression without psychotic features (HCC)   Current Medications:  Current Facility-Administered Medications  Medication Dose Route Frequency Provider Last Rate Last Dose  . acetaminophen (TYLENOL) tablet 650 mg  650 mg Oral Q6H PRN Lindon Romp A, NP      . alum & mag hydroxide-simeth (MAALOX/MYLANTA) 200-200-20 MG/5ML suspension 30 mL  30 mL Oral Q4H PRN Lindon Romp A, NP      . carbamazepine (TEGRETOL) chewable tablet 200 mg  200 mg Oral TID Johnn Hai, MD   200 mg at 08/30/18 1202  . clonazePAM (KLONOPIN) tablet 0.5 mg  0.5 mg Oral TID Johnn Hai, MD      . gabapentin (NEURONTIN) capsule 300 mg  300 mg Oral TID Johnn Hai, MD   300 mg at 08/30/18 1203  . hydrOXYzine (ATARAX/VISTARIL) tablet 25 mg  25 mg Oral Q6H PRN Lindon Romp A, NP   25 mg at 08/30/18 1204  . loperamide (IMODIUM) capsule 2-4 mg  2-4 mg Oral PRN Lindon Romp A, NP      . magnesium hydroxide (MILK OF MAGNESIA) suspension 30 mL  30 mL Oral Daily PRN Lindon Romp A, NP      . ondansetron (ZOFRAN-ODT) disintegrating tablet 4 mg  4 mg Oral Q6H PRN Rozetta Nunnery, NP   4 mg at 08/29/18 2157  . valACYclovir (VALTREX) tablet 1,000 mg  1,000 mg Oral Daily Lindon Romp A, NP   1,000 mg at 08/30/18 4970   PTA Medications: Medications Prior to Admission  Medication Sig Dispense Refill Last Dose  . albuterol (VENTOLIN HFA) 108 (90 Base) MCG/ACT inhaler Inhale 2 puffs into the lungs every 6 (six) hours as needed for wheezing or shortness of breath.      . ALPRAZolam (XANAX PO) Take 1-3 tablets by mouth once.     Marland Kitchen BREO ELLIPTA 100-25 MCG/INH AEPB Inhale 1 puff into the lungs daily.      . citalopram (CELEXA) 40 MG tablet Take 40 mg by mouth daily.     . cloNIDine (CATAPRES) 0.2 MG  tablet Take 0.2 mg by mouth 3 (three) times daily.      . folic acid (FOLVITE) 1 MG tablet Take 1 tablet (1 mg total) by mouth daily. (May buy from over the counter): For folate replacement (Patient not taking: Reported on 08/27/2018)     . gabapentin (NEURONTIN) 400 MG capsule Take 1 capsule (400 mg total) by mouth 2 (two) times daily. For agitation (Patient not taking: Reported on 08/27/2018) 60 capsule 0   . hydrOXYzine (ATARAX/VISTARIL) 25 MG tablet Take 1 tablet (25 mg total) by mouth every 6 (six) hours as needed for anxiety. (Patient not taking: Reported on 08/27/2018) 60 tablet 0   . hydrOXYzine (ATARAX/VISTARIL) 50 MG tablet Take 50 mg by mouth 3 (three) times daily.      Marland Kitchen KURVELO 0.15-30 MG-MCG tablet Take 1 tablet by mouth daily.      Marland Kitchen lidocaine (XYLOCAINE) 2 % jelly      . LORazepam (ATIVAN PO) Take 1-3 tablets by mouth once.     . metoprolol tartrate (LOPRESSOR) 25 MG tablet Take 0.5 tablets (12.5 mg total) by mouth 2 (two) times daily. For high blood pressure (Patient not taking: Reported on 04/25/2018) 60 tablet 0   . QUEtiapine (SEROQUEL) 300 MG  tablet Take 1 tablet (300 mg total) by mouth 2 (two) times daily. For mood control (Patient not taking: Reported on 08/27/2018) 60 tablet 0   . sertraline (ZOLOFT) 50 MG tablet Take 1 tablet (50 mg total) by mouth daily. For depression (Patient not taking: Reported on 08/27/2018) 30 tablet 0   . traZODone (DESYREL) 50 MG tablet Take 1 tablet (50 mg total) by mouth at bedtime as needed for sleep. (Patient not taking: Reported on 04/25/2018) 30 tablet 0   . valACYclovir (VALTREX) 1000 MG tablet Take 1 tablet (1,000 mg total) by mouth daily. For herpes infection       Patient Stressors:    Patient Strengths:    Treatment Modalities: Medication Management, Group therapy, Case management,  1 to 1 session with clinician, Psychoeducation, Recreational therapy.   Physician Treatment Plan for Primary Diagnosis: <principal problem not specified> Long  Term Goal(s): Improvement in symptoms so as ready for discharge Improvement in symptoms so as ready for discharge   Short Term Goals: Ability to verbalize feelings will improve Ability to disclose and discuss suicidal ideas Ability to demonstrate self-control will improve Ability to identify and develop effective coping behaviors will improve Ability to identify and develop effective coping behaviors will improve Ability to maintain clinical measurements within normal limits will improve Compliance with prescribed medications will improve  Medication Management: Evaluate patient's response, side effects, and tolerance of medication regimen.  Therapeutic Interventions: 1 to 1 sessions, Unit Group sessions and Medication administration.  Evaluation of Outcomes: Not Met  Physician Treatment Plan for Secondary Diagnosis: Active Problems:   Severe recurrent major depression without psychotic features ([place])  Long Term Goal(s): Improvement in symptoms so as ready for discharge Improvement in symptoms so as ready for discharge   Short Term Goals: Ability to verbalize feelings will improve Ability to disclose and discuss suicidal ideas Ability to demonstrate self-control will improve Ability to identify and develop effective coping behaviors will improve Ability to identify and develop effective coping behaviors will improve Ability to maintain clinical measurements within normal limits will improve Compliance with prescribed medications will improve     Medication Management: Evaluate patient's response, side effects, and tolerance of medication regimen.  Therapeutic Interventions: 1 to 1 sessions, Unit Group sessions and Medication administration.  Evaluation of Outcomes: Not Met   RN Treatment Plan for Primary Diagnosis: <principal problem not specified> Long Term Goal(s): Knowledge of disease and therapeutic regimen to maintain health will improve  Short Term Goals: Ability to  verbalize frustration and anger appropriately will improve, Ability to demonstrate self-control, Ability to participate in decision making will improve, Ability to verbalize feelings will improve, Ability to identify and develop effective coping behaviors will improve and Compliance with prescribed medications will improve  Medication Management: RN will administer medications as ordered by provider, will assess and evaluate patient's response and provide education to patient for prescribed medication. RN will report any adverse and/or side effects to prescribing provider.  Therapeutic Interventions: 1 on 1 counseling sessions, Psychoeducation, Medication administration, Evaluate responses to treatment, Monitor vital signs and CBGs as ordered, Perform/monitor CIWA, COWS, AIMS and Fall Risk screenings as ordered, Perform wound care treatments as ordered.  Evaluation of Outcomes: Not Met   LCSW Treatment Plan for Primary Diagnosis: <principal problem not specified> Long Term Goal(s): Safe transition to appropriate next level of care at discharge, Engage patient in therapeutic group addressing interpersonal concerns.  Short Term Goals: Engage patient in aftercare planning with referrals and resources  Therapeutic Interventions:  Assess for all discharge needs, 1 to 1 time with Social worker, Explore available resources and support systems, Assess for adequacy in community support network, Educate family and significant other(s) on suicide prevention, Complete Psychosocial Assessment, Interpersonal group therapy.  Evaluation of Outcomes: Not Met   Progress in Treatment: Attending groups: No. Participating in groups: No. Taking medication as prescribed: Yes. Toleration medication: Yes. Family/Significant other contact made: No, will contact:  if patient consents to collateral contacts Patient understands diagnosis: Yes. Discussing patient identified problems/goals with staff: Yes. Medical  problems stabilized or resolved: Yes. Denies suicidal/homicidal ideation: Yes. Issues/concerns per patient self-inventory: No. Other:   New problem(s) identified: None   New Short Term/Long Term Goal(s): medication stabilization, elimination of SI thoughts, development of comprehensive mental wellness plan.    Patient Goals:    Discharge Plan or Barriers: Patient recently admitted. CSW will continue to follow and assess for appropriate referrals and possible discharge planning.    Reason for Continuation of Hospitalization: Anxiety Depression Hallucinations Medication stabilization  Estimated Length of Stay: 3-5 days   Attendees: Patient: 08/30/2018 1:41 PM  Physician: Dr. Johnn Hai, MD 08/30/2018 1:41 PM  Nursing: Elberta Fortis.A, RN 08/30/2018 1:41 PM  RN Care Manager: 08/30/2018 1:41 PM  Social Worker: Radonna Ricker, [place] 08/30/2018 1:41 PM  Recreational Therapist:  08/30/2018 1:41 PM  Other:  08/30/2018 1:41 PM  Other:  08/30/2018 1:41 PM  Other: 08/30/2018 1:41 PM    Scribe for Treatment Team: Marylee Floras, [place] 08/30/2018 1:41 PM
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
## 2018-08-30 NOTE — Progress Notes (Signed)
Patient ID: Lindsay Lara, female   DOB: [birth year], 18 y.o.   MRN: [id number]  Pt refused morning vital signs.

## 2018-08-30 NOTE — Progress Notes (Signed)
Nursing 1:1 note D:Pt observed sleeping in bed with eyes closed. RR even and unlabored. No distress noted. A: 1:1 observation continues for safety  R: pt remains safe  

## 2018-08-30 NOTE — Progress Notes (Signed)
Recreation Therapy Notes  Date: 08/30/2018 Time: 10:00 am Location: 500 hall   Group Topic: Stress Exploration  Goal Area(s) Addresses:  Patient will work on worksheet on Stress Exploration. Patient will follow directions on first prompt.  Behavioral Response: Appropriate  Intervention: Worksheet  Activity:  Staff on 500 hall were provided with a worksheet on Stress Exploration. Staff was instructed to give it to the patients and have them work on it in place of [place]. Staff was also given 4 coloring sheets and were given the option to give them out.  Education:  Ability to follow Directions, Change of thought processes Discharge Planning, Goal Planning.   Education Outcome: Acknowledges education/In group clarification offered  Clinical Observations/Feedback: . Due to COVID-19, guidelines group was not held. Group members were provided a learning activity worksheet to work on the topic and above-stated goals. LRT is available to answer any questions patient may have regarding the worksheet.  Tomi Likens, LRT/CTRS         Enis Leatherwood L Brenlyn Beshara 08/30/2018 12:38 PM

## 2018-08-30 NOTE — Progress Notes (Signed)
[facility] MD Progress Note  08/30/2018 10:27 AM Lindsay Lara  MRN:  [id number] Subjective:   Patient was sluggish initially but is up and about now still irritable but no evidence of seizure activity no evidence of psychosis continues to progress to the detox regimen. 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 Acne   . ADHD (attention deficit hyperactivity disorder) 05/08/2012  . Anxiety   . Bipolar and related disorder ([place]) 09/18/2015  . Depression   . 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 Pneumonia at age 1  . Polysubstance abuse ([place]) 09/18/2015  . Scoliosis no treatment required  . Substance induced mood disorder ([place]) 09/18/2015   No past surgical history on file. Family History:  Family History  Problem Relation Age of Onset  . Alcoholism Father        and [place] grandfather  . Heart disease Father   . Hyperlipidemia Father   . Drug abuse Paternal Uncle     Social History:  Social History   Substance and Sexual Activity  Alcohol Use Yes   Comment: occasional     Social History   Substance and Sexual Activity  Drug Use Yes  . Types: Marijuana, Benzodiazepines, Cocaine   Comment: acid, xanax    Social History   Socioeconomic History  . Marital status: Single    Spouse name: Not on file  . Number of children: Not on file  . Years of education: Not on file  . Highest education level: Not on file  Occupational History  . Not on file  Social Needs  . Financial resource strain: Not on file  . Food insecurity    Worry: Not on file    Inability: Not on file  . Transportation needs    Medical: Not on file    Non-medical: Not on file  Tobacco Use  . Smoking status: Former  Smoker    Packs/day: 0.50    Years: 4.00    Pack years: 2.00    Types: Cigarettes    Quit date: 03/21/2018    Years since quitting: 0.4  . Smokeless tobacco: Never Used  Substance and Sexual Activity  . Alcohol use: Yes    Comment: occasional  . Drug use: Yes    Types: Marijuana, Benzodiazepines, Cocaine    Comment: acid, xanax  . Sexual activity: Yes    Partners: Male    Birth control/protection: Pill  Lifestyle  . Physical activity    Days per week: Not on file    Minutes per session: Not on file  . Stress: Not on file  Relationships  . Social Herbalist on phone: Not on file    Gets together: Not on file    Attends religious service: Not on file    Active member of club or organization: Not on file    Attends meetings of clubs or organizations: Not on file    Relationship status: Not on file  Other Topics Concern  . Not on file  Social History Narrative  . Not on file   Additional Social History:                         Sleep: Good  Appetite:  Good  Current Medications: Current Facility-Administered Medications  Medication Dose Route Frequency Provider Last Rate Last Dose  . acetaminophen (TYLENOL) tablet 650 mg  650 mg Oral Q6H PRN Nira ConnBerry, Jason A, NP      . alum & mag hydroxide-simeth (MAALOX/MYLANTA) 200-200-20 MG/5ML suspension 30 mL  30 mL Oral Q4H PRN Nira ConnBerry, Jason A, NP      . carbamazepine (TEGRETOL) chewable tablet 200 mg  200 mg Oral TID Malvin JohnsFarah, Eilam Shrewsbury, MD   200 mg at 08/30/18 0926  . clonazePAM (KLONOPIN) tablet 0.5 mg  0.5 mg Oral TID Malvin JohnsFarah, Cloyce Blankenhorn, MD      . gabapentin (NEURONTIN) capsule 300 mg  300 mg Oral TID Malvin JohnsFarah, Willie Loy, MD   300 mg at 08/30/18 0926  . hydrOXYzine (ATARAX/VISTARIL) tablet 25 mg  25 mg Oral Q6H PRN Jackelyn PolingBerry, Jason A, NP   25 mg at 08/29/18 2157  . loperamide (IMODIUM) capsule 2-4 mg  2-4 mg Oral PRN Nira ConnBerry, Jason A, NP      . magnesium hydroxide (MILK OF MAGNESIA) suspension 30 mL  30 mL Oral Daily PRN Nira ConnBerry, Jason A,  NP      . ondansetron (ZOFRAN-ODT) disintegrating tablet 4 mg  4 mg Oral Q6H PRN Jackelyn PolingBerry, Jason A, NP   4 mg at 08/29/18 2157  . valACYclovir (VALTREX) tablet 1,000 mg  1,000 mg Oral Daily Nira ConnBerry, Jason A, NP   1,000 mg at 08/30/18 [id number]    Lab Results:  Results for orders placed or performed during the hospital encounter of 08/28/18 (from the past 48 hour(s))  Comprehensive metabolic panel     Status: Abnormal   Collection Time: 08/28/18 11:50 PM  Result Value Ref Range   Sodium 141 135 - 145 mmol/L   Potassium 4.3 3.5 - 5.1 mmol/L   Chloride 107 98 - 111 mmol/L   CO2 23 22 - 32 mmol/L   Glucose, Bld 82 70 - 99 mg/dL   BUN 5 (L) 6 - 20 mg/dL   Creatinine, Ser 9.600.89 0.44 - 1.00 mg/dL   Calcium 9.2 8.9 - 45.410.3 mg/dL   Total Protein 6.9 6.5 - 8.1 g/dL   Albumin 3.8 3.5 - 5.0 g/dL   AST 27 15 - 41 U/L   ALT 22 0 - 44 U/L   Alkaline Phosphatase 46 38 - 126 U/L   Total Bilirubin 0.7 0.3 - 1.2 mg/dL   GFR calc non Af Amer >60 >60 mL/min   GFR calc Af Amer >60 >60 mL/min   Anion gap 11 5 - 15    Comment: Performed at [facility], [address] [place], [place] [address]  Ethanol     Status: None   Collection Time: 08/28/18 11:50 PM  Result Value Ref Range   Alcohol, Ethyl (B) <10 <10 mg/dL    Comment: (NOTE) Lowest detectable limit for serum alcohol is 10 mg/dL. For medical purposes only. Performed at [facility], [address] [place], [place] [address]   Salicylate level     Status: None   Collection Time: 08/28/18 11:50 PM  Result Value Ref Range   Salicylate Lvl <7.0 2.8 - 30.0 mg/dL    Comment: Performed at [facility], [address] [place], [place] [address]  Acetaminophen level     Status: Abnormal   Collection Time: 08/28/18 11:50 PM  Result Value Ref Range   Acetaminophen (Tylenol), Serum <10 (L) 10 - 30 ug/mL    Comment: (NOTE) Therapeutic concentrations  vary significantly. A range of 10-30 ug/mL  may be an effective concentration for many  patients. However, some  are best treated at concentrations outside of this range. Acetaminophen concentrations >150 ug/mL at 4 hours after ingestion  and >50 ug/mL at 12 hours after ingestion are often associated with  toxic reactions. Performed at [facility], [address] [place], [place] [address]   cbc     Status: Abnormal   Collection Time: 08/28/18 11:50 PM  Result Value Ref Range   WBC 9.7 4.0 - 10.5 K/uL   RBC 4.26 3.87 - 5.11 MIL/uL   Hemoglobin 12.6 12.0 - 15.0 g/dL   HCT 60.442.9 54.036.0 - 98.146.0 %   MCV 100.7 (H) 80.0 - 100.0 fL   MCH 29.6 26.0 - 34.0 pg   MCHC 29.4 (L) 30.0 - 36.0 g/dL   RDW 19.115.8 (H) 47.811.5 - 29.515.5 %   Platelets 193 150 - 400 K/uL   nRBC 0.0 0.0 - 0.2 %    Comment: Performed at [facility], [address] [place], [place] [address]  I-Stat beta hCG blood, ED     Status: None   Collection Time: 08/29/18 12:24 AM  Result Value Ref Range   I-stat hCG, quantitative <5.0 <5 mIU/mL   Comment 3            Comment:   GEST. AGE      CONC.  (mIU/mL)   <=1 WEEK        5 - 50     2 WEEKS       50 - 500     3 WEEKS       100 - 10,000     4 WEEKS     1,000 - 30,000        FEMALE AND NON-PREGNANT FEMALE:     LESS THAN 5 mIU/mL   Rapid urine drug screen (hospital performed)     Status: Abnormal   Collection Time: 08/29/18 12:36 AM  Result Value Ref Range   Opiates NONE DETECTED NONE DETECTED   Cocaine NONE DETECTED NONE DETECTED   Benzodiazepines POSITIVE (A) NONE DETECTED   Amphetamines NONE DETECTED NONE DETECTED   Tetrahydrocannabinol NONE DETECTED NONE DETECTED   Barbiturates NONE DETECTED NONE DETECTED    Comment: (NOTE) DRUG SCREEN FOR MEDICAL PURPOSES ONLY.  IF CONFIRMATION IS NEEDED FOR ANY PURPOSE, NOTIFY LAB WITHIN 5 DAYS. LOWEST DETECTABLE LIMITS FOR URINE DRUG SCREEN Drug Class                     Cutoff (ng/mL) Amphetamine and metabolites    1000 Barbiturate and metabolites    200 Benzodiazepine                 200 Tricyclics and  metabolites     300 Opiates and metabolites        300 Cocaine and metabolites        300 THC                            50 Performed at [facility], [address] [place], [place] [address]     Blood Alcohol level:  Lab Results  Component Value Date   [facility] <10 08/28/2018   ETH <10 08/27/2018    Metabolic Disorder Labs: Lab Results  Component Value Date   HGBA1C 5.0 02/10/2018   MPG 96.8 02/10/2018  MPG 103 09/17/2015   No results found for: PROLACTIN Lab Results  Component Value Date   CHOL 157 02/10/2018   TRIG 311 (H) 04/04/2018   HDL 47 02/10/2018   CHOLHDL 3.3 02/10/2018   VLDL 30 02/10/2018   LDLCALC 80 02/10/2018   LDLCALC 89 09/18/2015    Physical Findings: AIMS: Facial and Oral Movements Muscles of Facial Expression: None, normal Lips and Perioral Area: None, normal Jaw: None, normal Tongue: None, normal,Extremity Movements Upper (arms, wrists, hands, fingers): None, normal Lower (legs, knees, ankles, toes): None, normal, Trunk Movements Neck, shoulders, hips: None, normal, Overall Severity Severity of abnormal movements (highest score from questions above): None, normal Incapacitation due to abnormal movements: None, normal Patient's awareness of abnormal movements (rate only patient's report): No Awareness, Dental Status Current problems with teeth and/or dentures?: No Does patient usually wear dentures?: No  CIWA:  CIWA-Ar Total: 0 COWS:     Musculoskeletal: Strength & Muscle Tone: within normal limits Gait & Station: normal Patient leans: N/A  Psychiatric Specialty Exam: Physical Exam  ROS  Last menstrual period 08/27/2018.There is no height or weight on file to calculate BMI.  General Appearance: Casual  Eye Contact:  Good  Speech:  Garbled and Slurred  Volume:  Decreased  Mood:  Dysphoric  Affect:  Restricted  Thought Process:  Irrelevant and Descriptions of Associations: Circumstantial  Orientation:  Full (Time, Place, and  Person)  Thought Content:  Logical and Tangential  Suicidal Thoughts:  No  Homicidal Thoughts:  No  Memory:  Immediate;   Fair  Judgement:  Fair  Insight:  Shallow  Psychomotor Activity:  Normal  Concentration:  Concentration: Good  Recall:  Fair  Fund of Knowledge:  Fair  Language:  Fair  Akathisia:  Negative  Handed:  Right  AIMS (if indicated):     Assets:  Resilience Social Support  ADL's:  Intact  Cognition:  WNL  Sleep:  Number of Hours: 5.5     Treatment Plan Summary: Daily contact with patient to assess and evaluate symptoms and progress in treatment and Medication management  Continue detox regiment but taper off clonazepam only 4 more doses at 0.5 continue Tegretol Neurontin probable discharge by Monday if continues to improve but needs longer-term rehab and fails to accept this at this point in time  [facility],Joeleen Wortley, MD 08/30/2018, 10:27 AM

## 2018-08-30 NOTE — Progress Notes (Signed)
Patient ID: Lindsay Lara, female   DOB: 02/06/2001, 18 y.o.   MRN: [id number]   [place] NOVEL CORONAVIRUS (COVID-19) DAILY CHECK-OFF SYMPTOMS - answer yes or no to each - every day NO YES  Have you had a fever in the past 24 hours?  . Fever (Temp > 37.80C / 100F) X   Have you had any of these symptoms in the past 24 hours? . New Cough .  Sore Throat  .  Shortness of Breath .  Difficulty Breathing .  Unexplained Body Aches   X   Have you had any one of these symptoms in the past 24 hours not related to allergies?   . Runny Nose .  Nasal Congestion .  Sneezing   X   If you have had runny nose, nasal congestion, sneezing in the past 24 hours, has it worsened?  X   EXPOSURES - check yes or no X   Have you traveled outside the state in the past 14 days?  X   Have you been in contact with someone with a confirmed diagnosis of COVID-19 or PUI in the past 14 days without wearing appropriate PPE?  X   Have you been living in the same home as a person with confirmed diagnosis of COVID-19 or a PUI (household contact)?    X   Have you been diagnosed with COVID-19?    X              What to do next: Answered NO to all: Answered YES to anything:   Proceed with unit schedule Follow the [facility] Inpatient Flowsheet.   

## 2018-08-30 NOTE — Progress Notes (Signed)
D: Pt denies SI/HI/AV hallucinations. Pt has been at nurses station requesting clothing from locker along with other belongings. Patient made aware of why we don't go into lockers once her things are locked up.  A: Pt was offered support and encouragement. Pt was given scheduled medications. Pt was encourage to attend groups. Q 15 minute checks were done for safety.  R:Pt  Interacts with peers and staff. Pt is taking medication.   receptive to treatment and safety maintained on unit.

## 2018-08-30 NOTE — Progress Notes (Signed)
Pt attended spirituality group facilitated by Simone Curia, MDIv, [place].  Group Description:  Group focused on topic of hope.  Patients participated in facilitated discussion around topic, connecting with one another around experiences and definitions for hope.  Group members engaged with visual explorer photos, reflecting on what hope looks like for them today.  Group engaged in discussion around how their definitions of hope are present today in hospital.   Modalities: Psycho-social ed, Adlerian, Narrative, MI Patient Progress: Present for beginning of group.  Lethargic and asleep in chair.  Awoken by facilitator twice.  Liat left group as group discussion was beginning.

## 2018-08-30 NOTE — Plan of Care (Signed)
D: Pt alert and oriented on the unit. Pt engaging with RN staff and other pts. Pt denies SI/HI, A/VH. Pt isolated in her room asleep for most of the morning before sitting in the dayroom watching television. Pt became agitated when the MD did not give her more medicine, "I want the medicine I always take!" "I take a lot more than this at home." A: Education, support and encouragement provided, q15 minute safety checks remain in effect. Medications administered per MD orders. R: No reactions/side effects to medicine noted. Pt denies any concerns at this time, and verbally contracts for safety. Pt ambulating on the unit with no issues. Pt remains safe on and off the unit.   Problem: Coping: Goal: Ability to verbalize frustrations and anger appropriately will improve Outcome: Progressing Goal: Ability to demonstrate self-control will improve Outcome: Progressing

## 2018-08-30 NOTE — Progress Notes (Signed)
Nursing 1:1 note D:Pt observed in room talking with staff.  RR even and unlabored. No distress noted. A: 1:1 observation continues for safety  R: pt remains safe

## 2018-08-30 NOTE — Plan of Care (Signed)
  Problem: Activity: Goal: Sleeping patterns will improve Outcome: Progressing   Problem: Safety: Goal: Periods of time without injury will increase Outcome: Progressing   Problem: Education: Goal: Emotional status will improve Outcome: Not Progressing   Problem: Coping: Goal: Ability to demonstrate self-control will improve Outcome: Not Progressing

## 2018-08-30 NOTE — [facility] Suicide Risk Assessment (Signed)
[facility] INPATIENT:  Family/Significant Other Suicide Prevention Education  Suicide Prevention Education:  Patient Refusal for Family/Significant Other Suicide Prevention Education: The patient Lindsay Lara has refused to provide written consent for family/significant other to be provided Family/Significant Other Suicide Prevention Education during admission and/or prior to discharge.  Physician notified.  Joanne Chars, LCSW 08/30/2018, 3:47 PM

## 2018-08-31 NOTE — Progress Notes (Signed)
Patient has been at nursing station asking to be moved off this hall because the people over here are crazy she reports. She is requesting to speak to the doctor and talk with him about being moved back to [address] hall. She is very restless and requesting sleep medication. She is informed it is too early but will request an order for trazodone to aid with sleep. Patient received tylenol for generalized aching and vistaril for her anxiety. She returned to the dayroom to watch tv. Safety maintained on unit with 15 min checks.

## 2018-08-31 NOTE — Progress Notes (Signed)
Patient ID: Lindsay Lara, female   DOB: 11-25-2000, 18 y.o.   MRN: [id number]   [place] NOVEL CORONAVIRUS (COVID-19) DAILY CHECK-OFF SYMPTOMS - answer yes or no to each - every day NO YES  Have you had a fever in the past 24 hours?  . Fever (Temp > 37.80C / 100F) X   Have you had any of these symptoms in the past 24 hours? . New Cough .  Sore Throat  .  Shortness of Breath .  Difficulty Breathing .  Unexplained Body Aches   X   Have you had any one of these symptoms in the past 24 hours not related to allergies?   . Runny Nose .  Nasal Congestion .  Sneezing   X   If you have had runny nose, nasal congestion, sneezing in the past 24 hours, has it worsened?  X   EXPOSURES - check yes or no X   Have you traveled outside the state in the past 14 days?  X   Have you been in contact with someone with a confirmed diagnosis of COVID-19 or PUI in the past 14 days without wearing appropriate PPE?  X   Have you been living in the same home as a person with confirmed diagnosis of COVID-19 or a PUI (household contact)?    X   Have you been diagnosed with COVID-19?    X              What to do next: Answered NO to all: Answered YES to anything:   Proceed with unit schedule Follow the [facility] Inpatient Flowsheet.

## 2018-08-31 NOTE — Progress Notes (Addendum)
[facility]  08/31/2018 12:29 PM Lindsay Lara  MRN:  [id number] Subjective:  Patient seen at the bedside this morning. She did not get up for breakfast and is still sleeping. Her name has to be called several times to open her eyes, and she provides minimal answers on assessment. She denies SI/HI/AVH. Denies withdrawal symptoms or concerns. She turns over, pulls covers over her head and refuses to answer further questions.  From admission H&P: [name] is well-known to the service she is 18 years of age has an extensive history of polysubstance dependency and abuse/substance-induced mood disorder bipolar type/borderline personality disorder.  She is a long-term alprazolam abuser but states she had 5 months clean before her last 3751-month binge of intermittent alprazolam abuse. The patient herself denies that she needs detox however she was hallucinating on presentation which could indicate alprazolam intoxication versus withdrawal.  At any rate we will continue her detox regimen already started.  Principal Problem: <principal problem not specified> Diagnosis: Active Problems:   Severe recurrent major depression without psychotic features (HCC)  Total Time spent with patient: 15 minutes  Past Psychiatric History: See admission H&P  Past Medical History:  Past Medical History:  Diagnosis Date  . Acne   . ADHD (attention deficit hyperactivity disorder) 05/08/2012  . Anxiety   . Bipolar and related disorder (HCC) 09/18/2015  . Depression   . 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 Pneumonia at age 89  . Polysubstance abuse (HCC) 09/18/2015  . Scoliosis no treatment required  . Substance induced mood disorder (HCC) 09/18/2015   No past surgical history on file. Family History:  Family History  Problem Relation Age of Onset  . Alcoholism Father        and Paterna grandfather  . Heart disease Father   . Hyperlipidemia Father   . Drug abuse Paternal Uncle    Family  Psychiatric  History: See admission H&P Social History:  Social History   Substance and Sexual Activity  Alcohol Use Yes   Comment: occasional     Social History   Substance and Sexual Activity  Drug Use Yes  . Types: Marijuana, Benzodiazepines, Cocaine   Comment: acid, xanax    Social History   Socioeconomic History  . Marital status: Single    Spouse name: Not on file  . Number of children: Not on file  . Years of education: Not on file  . Highest education level: Not on file  Occupational History  . Not on file  Social Needs  . Financial resource strain: Not on file  . Food insecurity    Worry: Not on file    Inability: Not on file  . Transportation needs    Medical: Not on file    Non-medical: Not on file  Tobacco Use  . Smoking status: Former Smoker    Packs/day: 0.50    Years: 4.00    Pack years: 2.00    Types: Cigarettes    Quit date: 03/21/2018    Years since quitting: 0.4  . Smokeless tobacco: Never Used  Substance and Sexual Activity  . Alcohol use: Yes    Comment: occasional  . Drug use: Yes    Types: Marijuana, Benzodiazepines, Cocaine    Comment: acid, xanax  . Sexual activity: Yes    Partners: Male    Birth control/protection: Pill  Lifestyle  . Physical activity    Days per week: Not on file    Minutes  per session: Not on file  . Stress: Not on file  Relationships  . Social Musicianconnections    Talks on phone: Not on file    Gets together: Not on file    Attends religious service: Not on file    Active member of club or organization: Not on file    Attends meetings of clubs or organizations: Not on file    Relationship status: Not on file  Other Topics Concern  . Not on file  Social History Narrative  . Not on file   Additional Social History:                         Sleep: Good  Appetite:  Good  Current Medications: Current Facility-Administered Medications  Medication Dose Route Frequency Provider Last Rate Last Dose   . acetaminophen (TYLENOL) tablet 650 mg  650 mg Oral Q6H PRN Nira ConnBerry, Jason A, NP   650 mg at 08/31/18 0917  . alum & mag hydroxide-simeth (MAALOX/MYLANTA) 200-200-20 MG/5ML suspension 30 mL  30 mL Oral Q4H PRN Nira ConnBerry, Jason A, NP      . carbamazepine (TEGRETOL) chewable tablet 200 mg  200 mg Oral TID Malvin JohnsFarah, Brian, MD   200 mg at 08/31/18 1227  . clonazePAM (KLONOPIN) tablet 0.5 mg  0.5 mg Oral TID Malvin JohnsFarah, Brian, MD   0.5 mg at 08/31/18 1227  . gabapentin (NEURONTIN) capsule 300 mg  300 mg Oral TID Malvin JohnsFarah, Brian, MD   300 mg at 08/31/18 1227  . hydrOXYzine (ATARAX/VISTARIL) tablet 25 mg  25 mg Oral Q6H PRN Nira ConnBerry, Jason A, NP   25 mg at 08/31/18 0917  . loperamide (IMODIUM) capsule 2-4 mg  2-4 mg Oral PRN Nira ConnBerry, Jason A, NP      . magnesium hydroxide (MILK OF MAGNESIA) suspension 30 mL  30 mL Oral Daily PRN Nira ConnBerry, Jason A, NP      . ondansetron (ZOFRAN-ODT) disintegrating tablet 4 mg  4 mg Oral Q6H PRN Jackelyn PolingBerry, Jason A, NP   4 mg at 08/29/18 2157  . traZODone (DESYREL) tablet 100 mg  100 mg Oral QHS,MR X 1 Simon, Spencer E, PA-C      . valACYclovir (VALTREX) tablet 1,000 mg  1,000 mg Oral Daily Nira ConnBerry, Jason A, NP   1,000 mg at 08/31/18 [id number]    Lab Results: No results found for this or any previous visit (from the past 48 hour(s)).  Blood Alcohol level:  Lab Results  Component Value Date   ETH <10 08/28/2018   ETH <10 08/27/2018    Metabolic Disorder Labs: Lab Results  Component Value Date   HGBA1C 5.0 02/10/2018   MPG 96.8 02/10/2018   MPG 103 09/17/2015   No results found for: PROLACTIN Lab Results  Component Value Date   CHOL 157 02/10/2018   TRIG 311 (H) 04/04/2018   HDL 47 02/10/2018   CHOLHDL 3.3 02/10/2018   VLDL 30 02/10/2018   LDLCALC 80 02/10/2018   LDLCALC 89 09/18/2015    Physical Findings: AIMS: Facial and Oral Movements Muscles of Facial Expression: None, normal Lips and Perioral Area: None, normal Jaw: None, normal Tongue: None, normal,Extremity Movements Upper  (arms, wrists, hands, fingers): None, normal Lower (legs, knees, ankles, toes): None, normal, Trunk Movements Neck, shoulders, hips: None, normal, Overall Severity Severity of abnormal movements (highest score from questions above): None, normal Incapacitation due to abnormal movements: None, normal Patient's awareness of abnormal movements (rate only patient's report): No Awareness, Dental Status  Current problems with teeth and/or dentures?: No Does patient usually wear dentures?: No  CIWA:  CIWA-Ar Total: 7 COWS:     Musculoskeletal: Strength & Muscle Tone: within normal limits Gait & Station: normal Patient leans: N/A  Psychiatric Specialty Exam: Physical Exam  Nursing note and vitals reviewed. Constitutional: She is oriented to person, place, and time. She appears well-developed and well-nourished.  Cardiovascular: Normal rate.  Respiratory: Effort normal.  Neurological: She is alert and oriented to person, place, and time.    Review of Systems  Constitutional: Negative.   Psychiatric/Behavioral: Positive for substance abuse. Negative for depression, hallucinations and suicidal ideas. The patient does not have insomnia.     Blood pressure 136/85, pulse (!) 105, resp. rate 18, last menstrual period 08/27/2018.There is no height or weight on file to calculate BMI.  General Appearance: Disheveled  Eye Contact:  Minimal  Speech:  Slow  Volume:  Decreased  Mood:  Irritable  Affect:  Congruent  Thought Process:  Coherent  Orientation:  Full (Time, Place, and Person)  Thought Content:  Logical  Suicidal Thoughts:  No  Homicidal Thoughts:  No  Memory:  Immediate;   Fair Recent;   Fair  Judgement:  Impaired  Insight:  Lacking  Psychomotor Activity:  Decreased  Concentration:  Concentration: Poor and Attention Span: Poor  Recall:  [employer] of Knowledge:  Fair  Language:  Fair  Akathisia:  No  Handed:  Right  AIMS (if indicated):     Assets:  Music therapist Physical Health Resilience  ADL's:  Intact  Cognition:  WNL  Sleep:  Number of Hours: 6.75     Treatment Plan Summary: Daily contact with patient to assess and evaluate symptoms and progress in treatment and Medication management   Continue inpatient hospitalization.  Continue Klonopin taper with 0.5 mg PO TID for ETOH withdrawal Continue Tegretol 200 mg PO TID for mood stability/seizure prevention Continue Neurontin 300 mg PO TID for anxiety Continue Vistaril 25 mg PO Q6HR PRN anxiety Continue Valtrex 1000 mg PO daily for herpes Continue trazodone 100 mg PO QHS PRN insomnia  Patient will participate in the therapeutic group milieu.  Discharge disposition in progress.   Connye Burkitt, NP 08/31/2018, 12:29 PM   Attest to NP Progress Note

## 2018-08-31 NOTE — Progress Notes (Signed)
Patient ID: Lindsay Lara, female   DOB: [birth year], 18 y.o.   MRN: [id number]  Pt on phone in dayroom yelling and cursing, "I'm going to flip my shit!"

## 2018-08-31 NOTE — [facility] Group Notes (Signed)
[facility] Group Notes: (Clinical Social Work)   08/31/2018      Type of Therapy:  Group Therapy   Participation Level:  Did Not Attend - was invited, chose not to attend.   Bradi Arbuthnot Grossman-Orr, LCSW 08/31/2018, 1:08 PM     

## 2018-08-31 NOTE — Plan of Care (Signed)
D: Pt alert and oriented on the unit. Pt sat in the dayroom watching television during the morning. Pt argued and cursed during a phone call and became irritable with staff. Pt laid on her bed for the rest of the morning. Pt denies SI/HI, A/VH. PT rated her depression and hopelessness both a 0, and her anxiety a 2, with 10 being the worst. Pt's goal for today is, "Mentally prepare to loose Ryan." Pt did not attend unit groups.   A: Education, support and encouragement provided, q15 minute safety checks remain in effect. Medications administered per MD orders.  R: No reactions/side effects to medicine noted. Pt denies any concerns at this time, and verbally contracts for safety. Pt ambulating on the unit with no issues. Pt remains safe on and off the unit.   Problem: Activity: Goal: Interest or engagement in activities will improve Outcome: Not Progressing   Problem: Coping: Goal: Ability to verbalize frustrations and anger appropriately will improve Outcome: Progressing

## 2018-09-01 MED ORDER — HYDROXYZINE HCL 25 MG PO TABS
25.0000 mg | ORAL_TABLET | Freq: Four times a day (QID) | ORAL | Status: DC | PRN
  Administered 2018-09-01: 25 mg via ORAL
  Filled 2018-09-01: qty 1

## 2018-09-01 MED ORDER — CARBAMAZEPINE 100 MG PO CHEW
300.0000 mg | CHEWABLE_TABLET | Freq: Three times a day (TID) | ORAL | Status: DC
  Administered 2018-09-01 (×2): 300 mg via ORAL
  Filled 2018-09-01 (×6): qty 3

## 2018-09-01 NOTE — Progress Notes (Signed)
Patient has been up in the dayroom watching tv with peers briefly tonight. She came to nursing station numerous times requesting medications that she has already taken on day shift or medications she does not have ordered. She requested her birth control pills, clonidine, visteril, and clonopin all at the same time. Writer asked that she not keep asking for medications not ordered she became mad and started cursing and swearing at Probation officer. She went into the dayroom still cursing so other patients could hear her. Writer asked that she stop with the cursing and she called Probation officer a bitch and asked me not to talk to her anymore. Patient later came to apologize to Probation officer. Safety maintained on unit with 15 min checks.

## 2018-09-01 NOTE — Progress Notes (Signed)
Pt irritable and easily agitated. Pt med seeking and constantly asking for antianxiety meds and clonidine. Writer reviewed medications with the pt. Pt denies SI/HI. Pt reports withdrawal symptoms of craving, cramping, and nausea from benzo's. Pt reports good sleep at bedtime.   Orders reviewed with pt. Verbal support provided. Pt encouraged to attend groups. 15 minute checks performed for safety.   Pt compliant with tx plan.

## 2018-09-01 NOTE — Progress Notes (Signed)
Patient has been observed in the dayroom watching tv and interacting with select peers. She has been much calmer tonight and has not been requesting medications constantly. She reported to Probation officer that [employer]is making her feel weird. She reports that she has been asking her to come with her home when she is discharged and have a baby for her. 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 She reports that she doesn't feel comfortable around her anymore. Writer in formed her that she was safe here and to inform staff if she says anything about this tonight. MHT sat in the dayroom with patients to overhear conversations taking place and writer kept check on the hallway. Safety maintained on unit with 15 min checks.

## 2018-09-01 NOTE — Progress Notes (Addendum)
[facility]  09/01/2018 8:33 AM Lindsay Lara  MRN:  [id number] Subjective:  "Go away."  Lindsay Lara found still sleeping in bed. She did not get up for breakfast. She is irritable and refuses to open eyes and participate in conversation. She denies SI/HI/AVH. Denies withdrawal symptoms. VSS. Record shows 6.75 hours of sleep. Per nursing notes and report she has been irritable and cursing with staff during the daytime.   From admission H&P: Lindsay Lara is well-known to the service she is 18 years of age has an extensive history of polysubstance dependency and abuse/substance-induced mood disorder bipolar type/borderline personality disorder. She is a long-term alprazolam abuser but states she had 5 months clean before her last 6474-month binge of intermittent alprazolam abuse. The patient herself denies that she needs detox however she was hallucinating on presentation which could indicate alprazolam intoxication versus withdrawal. At any rate we will continue her detox regimen already started.  Principal Problem: <principal problem not specified> Diagnosis: Active Problems:   Severe recurrent major depression without psychotic features (HCC)  Total Time spent with patient: 15 minutes  Past Psychiatric History: See admission H&P  Past Medical History:  Past Medical History:  Diagnosis Date  . Acne   . ADHD (attention deficit hyperactivity disorder) 05/08/2012  . Anxiety   . Bipolar and related disorder (HCC) 09/18/2015  . Depression   . 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 Pneumonia at age 89  . Polysubstance abuse (HCC) 09/18/2015  . Scoliosis no treatment required  . Substance induced mood disorder (HCC) 09/18/2015   No past surgical history on file. Family History:  Family History  Problem Relation Age of Onset  . Alcoholism Father        and Paterna grandfather  . Heart disease Father   . Hyperlipidemia Father   . Drug abuse Paternal Uncle    Family  Psychiatric  History: See admission H&P Social History:  Social History   Substance and Sexual Activity  Alcohol Use Yes   Comment: occasional     Social History   Substance and Sexual Activity  Drug Use Yes  . Types: Marijuana, Benzodiazepines, Cocaine   Comment: acid, xanax    Social History   Socioeconomic History  . Marital status: Single    Spouse name: Not on file  . Number of children: Not on file  . Years of education: Not on file  . Highest education level: Not on file  Occupational History  . Not on file  Social Needs  . Financial resource strain: Not on file  . Food insecurity    Worry: Not on file    Inability: Not on file  . Transportation needs    Medical: Not on file    Non-medical: Not on file  Tobacco Use  . Smoking status: Former Smoker    Packs/day: 0.50    Years: 4.00    Pack years: 2.00    Types: Cigarettes    Quit date: 03/21/2018    Years since quitting: 0.4  . Smokeless tobacco: Never Used  Substance and Sexual Activity  . Alcohol use: Yes    Comment: occasional  . Drug use: Yes    Types: Marijuana, Benzodiazepines, Cocaine    Comment: acid, xanax  . Sexual activity: Yes    Partners: Male    Birth control/protection: Pill  Lifestyle  . Physical activity    Days per week: Not on file    Minutes per session:  Not on file  . Stress: Not on file  Relationships  . Social Musicianconnections    Talks on phone: Not on file    Gets together: Not on file    Attends religious service: Not on file    Active member of club or organization: Not on file    Attends meetings of clubs or organizations: Not on file    Relationship status: Not on file  Other Topics Concern  . Not on file  Social History Narrative  . Not on file   Additional Social History:                         Sleep: Good  Appetite:  Good  Current Medications: Current Facility-Administered Medications  Medication Dose Route Frequency Provider Last Rate Last Dose   . acetaminophen (TYLENOL) tablet 650 mg  650 mg Oral Q6H PRN Jackelyn PolingBerry, Jason A, NP   650 mg at 08/31/18 2013  . alum & mag hydroxide-simeth (MAALOX/MYLANTA) 200-200-20 MG/5ML suspension 30 mL  30 mL Oral Q4H PRN Nira ConnBerry, Jason A, NP      . carbamazepine (TEGRETOL) chewable tablet 200 mg  200 mg Oral TID Malvin JohnsFarah, Brian, MD   200 mg at 08/31/18 1617  . gabapentin (NEURONTIN) capsule 300 mg  300 mg Oral TID Malvin JohnsFarah, Brian, MD   300 mg at 08/31/18 1617  . magnesium hydroxide (MILK OF MAGNESIA) suspension 30 mL  30 mL Oral Daily PRN Nira ConnBerry, Jason A, NP      . traZODone (DESYREL) tablet 100 mg  100 mg Oral QHS,MR X 1 Kerry HoughSimon, Spencer E, PA-C   100 mg at 08/31/18 2110  . valACYclovir (VALTREX) tablet 1,000 mg  1,000 mg Oral Daily Nira ConnBerry, Jason A, NP   1,000 mg at 08/31/18 [id number]    Lab Results: No results found for this or any previous visit (from the past 48 hour(s)).  Blood Alcohol level:  Lab Results  Component Value Date   ETH <10 08/28/2018   ETH <10 08/27/2018    Metabolic Disorder Labs: Lab Results  Component Value Date   HGBA1C 5.0 02/10/2018   MPG 96.8 02/10/2018   MPG 103 09/17/2015   No results found for: PROLACTIN Lab Results  Component Value Date   CHOL 157 02/10/2018   TRIG 311 (H) 04/04/2018   HDL 47 02/10/2018   CHOLHDL 3.3 02/10/2018   VLDL 30 02/10/2018   LDLCALC 80 02/10/2018   LDLCALC 89 09/18/2015    Physical Findings: AIMS: Facial and Oral Movements Muscles of Facial Expression: None, normal Lips and Perioral Area: None, normal Jaw: None, normal Tongue: None, normal,Extremity Movements Upper (arms, wrists, hands, fingers): None, normal Lower (legs, knees, ankles, toes): None, normal, Trunk Movements Neck, shoulders, hips: None, normal, Overall Severity Severity of abnormal movements (highest score from questions above): None, normal Incapacitation due to abnormal movements: None, normal Patient's awareness of abnormal movements (rate only patient's report): No  Awareness, Dental Status Current problems with teeth and/or dentures?: No Does patient usually wear dentures?: No  CIWA:  CIWA-Ar Total: 7 COWS:     Musculoskeletal: Strength & Muscle Tone: within normal limits Gait & Station: normal Patient leans: N/A  Psychiatric Specialty Exam: Physical Exam  Nursing note and vitals reviewed. Constitutional: She is oriented to person, place, and time. She appears well-developed and well-nourished.  Cardiovascular: Normal rate.  Respiratory: Effort normal.  Neurological: She is alert and oriented to person, place, and time.    Review of  Systems  Constitutional: Negative.   Psychiatric/Behavioral: Positive for substance abuse. Negative for depression, hallucinations and suicidal ideas. The patient is not nervous/anxious and does not have insomnia.     Blood pressure 136/85, pulse (!) 105, resp. rate 18, last menstrual period 08/27/2018.There is no height or weight on file to calculate BMI.  General Appearance: Disheveled  Eye Contact:  None  Speech:  Slow  Volume:  Decreased  Mood:  Irritable  Affect:  Congruent  Thought Process:  Coherent  Orientation:  Full (Time, Place, and Person)  Thought Content:  Logical  Suicidal Thoughts:  No  Homicidal Thoughts:  No  Memory:  Immediate;   Fair Recent;   Fair  Judgement:  Impaired  Insight:  Lacking  Psychomotor Activity:  Normal  Concentration:  Concentration: Fair  Recall:  [employer] of Knowledge:  Fair  Language:  Fair  Akathisia:  No  Handed:  Right  AIMS (if indicated):     Assets:  Communication Skills Leisure Time Physical Health  ADL's:  Intact  Cognition:  WNL  Sleep:  Number of Hours: 6.75     Treatment Plan Summary: Daily contact with patient to assess and evaluate symptoms and progress in treatment and Medication management   Continue inpatient hospitalization.  Increase Tegretol to 300 mg PO TID for mood stability Continue Neurontin 300 mg PO TID for  anxiety Continue Vistaril 25 mg PO Q6HR PRN anxiety Continue Valtrex 1000 mg PO daily for herpes Continue trazodone 100 mg PO QHS PRN insomnia  Patient will participate in the therapeutic group milieu.  Discharge disposition in progress.   Connye Burkitt, NP 09/01/2018, 8:33 AM   Attest to NP Progress Notes

## 2018-09-01 NOTE — [facility] Group Notes (Signed)
[facility] LCSW Group Therapy Note  Date/Time:  09/01/2018  11:00AM-12:00PM  Type of Therapy and Topic:  Group Therapy:  Music and Mood  Participation Level:  Did Not Attend   Description of Group: In this process group, members listened to a variety of genres of music and identified that different types of music evoke different responses.  Patients were encouraged to identify music that was soothing for them and music that was energizing for them.  Patients discussed how this knowledge can help with wellness and recovery in various ways including managing depression and anxiety as well as encouraging healthy sleep habits.    Therapeutic Goals: 1. Patients will explore the impact of different varieties of music on mood 2. Patients will verbalize the thoughts they have when listening to different types of music 3. Patients will identify music that is soothing to them as well as music that is energizing to them 4. Patients will discuss how to use this knowledge to assist in maintaining wellness and recovery 5. Patients will explore the use of music as a coping skill  Summary of Patient Progress:  N/A  Therapeutic Modalities: Solution Focused Brief Therapy Activity   Wymon Swaney Grossman-Orr, LCSW    

## 2018-09-02 MED ORDER — CARBAMAZEPINE 100 MG PO CHEW: 300.0000 mg | CHEWABLE_TABLET | Freq: Three times a day (TID) | ORAL | 2 refills | Status: AC

## 2018-09-02 MED ORDER — GABAPENTIN 400 MG PO CAPS: 400.0000 mg | ORAL_CAPSULE | Freq: Three times a day (TID) | ORAL | 2 refills | Status: AC

## 2018-09-02 NOTE — Discharge Summary (Signed)
Physician Discharge Summary Note  Patient:  Lindsay Lara is an 18 y.o., female MRN:  [id number] DOB:  October 10, 2000 Patient phone:  [phone] (home)  Patient address:   [address] [place] [id number],  Total Time spent with patient: 15 minutes  Date of Admission:  08/29/2018 Date of Discharge: 09/02/18  Reason for Admission:  Overdose on Valium and Xanax  Principal Problem: <principal problem not specified> Discharge Diagnoses: Active Problems:   Severe recurrent major depression without psychotic features (HCC)   Past Psychiatric History: Polysubstance abuse with history of multiple overdoses  Past Medical History:  Past Medical History:  Diagnosis Date  . Acne   . ADHD (attention deficit hyperactivity disorder) 05/08/2012  . Anxiety   . Bipolar and related disorder (HCC) 09/18/2015  . Depression   . 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 Pneumonia at age 18  . Polysubstance abuse (HCC) 09/18/2015  . Scoliosis no treatment required  . Substance induced mood disorder (HCC) 09/18/2015   No past surgical history on file. Family History:  Family History  Problem Relation Age of Onset  . Alcoholism Father        and [place] grandfather  . Heart disease Father   . Hyperlipidemia Father   . Drug abuse Paternal Uncle    Family Psychiatric  History: Denies Social History:  Social History   Substance and Sexual Activity  Alcohol Use Yes   Comment: occasional     Social History   Substance and Sexual Activity  Drug Use Yes  . Types: Marijuana, Benzodiazepines, Cocaine   Comment: acid, xanax    Social History   Socioeconomic History  . Marital status: Single    Spouse name: Not on file  . Number of children: Not on file  . Years of education: Not on file  . Highest education level: Not on file  Occupational History  . Not on file  Social Needs  . Financial resource strain: Not on file  . Food insecurity    Worry: Not on file    Inability:  Not on file  . Transportation needs    Medical: Not on file    Non-medical: Not on file  Tobacco Use  . Smoking status: Former Smoker    Packs/day: 0.50    Years: 4.00    Pack years: 2.00    Types: Cigarettes    Quit date: 03/21/2018    Years since quitting: 0.4  . Smokeless tobacco: Never Used  Substance and Sexual Activity  . Alcohol use: Yes    Comment: occasional  . Drug use: Yes    Types: Marijuana, Benzodiazepines, Cocaine    Comment: acid, xanax  . Sexual activity: Yes    Partners: Male    Birth control/protection: Pill  Lifestyle  . Physical activity    Days per week: Not on file    Minutes per session: Not on file  . Stress: Not on file  Relationships  . Social Musicianconnections    Talks on phone: Not on file    Gets together: Not on file    Attends religious service: Not on file    Active member of club or organization: Not on file    Attends meetings of clubs or organizations: Not on file    Relationship status: Not on file  Other Topics Concern  . Not on file  Social History Narrative  . Not on file    Hospital Course:  From admission H&P: Ms.  Dunlop is well-known to the service she is 18 years of age has an extensive history of polysubstance dependency and abuse/substance-induced mood disorder bipolar type/borderline personality disorder.  She is a long-term alprazolam abuser but states she had 5 months clean before her last 67-month binge of intermittent alprazolam abuse.  She herself describes herself as "an alcoholic you can just have one drink" she describes taking 1 Xanax after another and therefore argues she was not in fact suicidal but again just overused the drug to get high. She had actually smuggled in a bottle of alprazolam and was dispensing into other patients on the 300 hall and therefore she was transferred to the 500 hall, further she has been placed on one-to-one precautions, and further problematic is her friendship with another patient on the other  ward (who is since been discharged). The patient herself denies that she needs detox however she was hallucinating on presentation which could indicate alprazolam intoxication versus withdrawal.  At any rate we will continue her detox regimen already started. She denies current hallucinations denies thoughts of harming self or others  [name] was admitted after overdose on Xanax and Valium- reported 11 mg Xanax and 1.5 mg Valium. She hid a bottle of Xanax in her vagina during admission process to [facility] and was sent back to ED after taking an unknown number of Xanax from the bottle. She returned to [facility] after medical clearance. She had detox protocol from benzodiazepines. She was started on Tegretol, Neurontin and trazodone. She was noted to be med-seeking for controlled substances during admission. She declined to participate in therapy on the unit. She declined referrals to rehab. She remained on the [facility] unit for 4 days. She stabilized with medication and therapy. She was discharged on the medications listed below. She has shown stable mood, affect, sleep, appetite, and interaction. She denies any SI/HI/AVH and contracts for safety. She agrees to follow up at the [facility] (see below). Patient is provided with prescriptions for medications upon discharge. She is discharging home.  Physical Findings: AIMS: Facial and Oral Movements Muscles of Facial Expression: None, normal Lips and Perioral Area: None, normal Jaw: None, normal Tongue: None, normal,Extremity Movements Upper (arms, wrists, hands, fingers): None, normal Lower (legs, knees, ankles, toes): None, normal, Trunk Movements Neck, shoulders, hips: None, normal, Overall Severity Severity of abnormal movements (highest score from questions above): None, normal Incapacitation due to abnormal movements: None, normal Patient's awareness of abnormal movements (rate only patient's report): No Awareness, Dental Status Current problems with  teeth and/or dentures?: No Does patient usually wear dentures?: No  CIWA:  CIWA-Ar Total: 0 COWS:     Musculoskeletal: Strength & Muscle Tone: within normal limits Gait & Station: normal Patient leans: N/A  Psychiatric Specialty Exam: Physical Exam  Nursing note and vitals reviewed. Constitutional: She is oriented to person, place, and time. She appears well-developed and well-nourished.  Cardiovascular: Normal rate.  Respiratory: Effort normal.  Neurological: She is alert and oriented to person, place, and time.    Review of Systems  Constitutional: Negative.   Psychiatric/Behavioral: Positive for substance abuse. Negative for depression, hallucinations and suicidal ideas. The patient is not nervous/anxious and does not have insomnia.     Blood pressure (!) 152/81, pulse (!) 115, temperature 98 F (36.7 C), resp. rate 18, last menstrual period 08/27/2018.There is no height or weight on file to calculate BMI.  See MD's discharge SRA      Has this patient used any form of tobacco  in the last 30 days? (Cigarettes, Smokeless Tobacco, Cigars, and/or Pipes)  No  Blood Alcohol level:  Lab Results  Component Value Date   ETH <10 08/28/2018   ETH <10 08/27/2018    Metabolic Disorder Labs:  Lab Results  Component Value Date   HGBA1C 5.0 02/10/2018   MPG 96.8 02/10/2018   MPG 103 09/17/2015   No results found for: PROLACTIN Lab Results  Component Value Date   CHOL 157 02/10/2018   TRIG 311 (H) 04/04/2018   HDL 47 02/10/2018   CHOLHDL 3.3 02/10/2018   VLDL 30 02/10/2018   LDLCALC 80 02/10/2018   LDLCALC 89 09/18/2015    See Psychiatric Specialty Exam and Suicide Risk Assessment completed by Attending Physician prior to discharge.  Discharge destination:  Home  Is patient on multiple antipsychotic therapies at discharge:  No   Has Patient had three or more failed trials of antipsychotic monotherapy by history:  No  Recommended Plan for Multiple Antipsychotic  Therapies: NA   Allergies as of 09/02/2018      Reactions   Latex Other (See Comments)   Irritation       Medication List    STOP taking these medications   albuterol 108 (90 Base) MCG/ACT inhaler Commonly known as: VENTOLIN HFA   ATIVAN PO   Breo Ellipta 100-25 MCG/INH Aepb Generic drug: fluticasone furoate-vilanterol   citalopram 40 MG tablet Commonly known as: CELEXA   hydrOXYzine 25 MG tablet Commonly known as: ATARAX/VISTARIL   hydrOXYzine 50 MG tablet Commonly known as: ATARAX/VISTARIL   lidocaine 2 % jelly Commonly known as: XYLOCAINE   QUEtiapine 300 MG tablet Commonly known as: SEROQUEL   sertraline 50 MG tablet Commonly known as: ZOLOFT   XANAX PO     TAKE these medications     Indication  carbamazepine 100 MG chewable tablet Commonly known as: TEGRETOL Chew 3 tablets (300 mg total) by mouth 3 (three) times daily.  Indication: Alcohol Withdrawal Syndrome, Manic-Depression   cloNIDine 0.2 MG tablet Commonly known as: CATAPRES Take 0.2 mg by mouth 3 (three) times daily.  Indication: High Blood Pressure Disorder   folic acid 1 MG tablet Commonly known as: FOLVITE Take 1 tablet (1 mg total) by mouth daily. (May buy from over the counter): For folate replacement  Indication: Anemia From Inadequate Folic Acid   gabapentin 400 MG capsule Commonly known as: NEURONTIN Take 1 capsule (400 mg total) by mouth 3 (three) times daily. For agitation What changed: when to take this  Indication: Abuse or Misuse of Alcohol, Agitation   Kurvelo 0.15-30 MG-MCG tablet Generic drug: levonorgestrel-ethinyl estradiol Take 1 tablet by mouth daily.  Indication: Absence of Menstrual Periods   metoprolol tartrate 25 MG tablet Commonly known as: LOPRESSOR Take 0.5 tablets (12.5 mg total) by mouth 2 (two) times daily. For high blood pressure  Indication: High Blood Pressure Disorder   traZODone 50 MG tablet Commonly known as: DESYREL Take 1 tablet (50 mg total)  by mouth at bedtime as needed for sleep.  Indication: Trouble Sleeping   valACYclovir 1000 MG tablet Commonly known as: VALTREX Take 1 tablet (1,000 mg total) by mouth daily. For herpes infection  Indication: Herpes Simplex affecting the Lip      Follow-up Information    [facility] Follow up on 09/12/2018.   Why: Therapy and medication management appointmet is Thursday, 7/9 at 8:00a.   Contact information: [address] [place] [address] [phone]  Follow-up recommendations: Activity as tolerated. Diet as recommended by primary care physician. Keep all scheduled follow-up appointments as recommended.   Comments:   Patient is instructed to take all prescribed medications as recommended. Report any side effects or adverse reactions to your outpatient psychiatrist. Patient is instructed to abstain from alcohol and illegal drugs while on prescription medications. In the event of worsening symptoms, patient is instructed to call the crisis hotline, 911, or go to the nearest emergency department for evaluation and treatment.  Signed: Aldean BakerJanet E Taryn Nave, NP 09/02/2018, 9:20 AM

## 2018-09-02 NOTE — Tx Team (Signed)
Interdisciplinary Treatment and Diagnostic Plan Update  09/02/2018 Time of Session: 09:10am Lindsay Lara MRN: [id number]  Principal Diagnosis: <principal problem not specified>  Secondary Diagnoses: Active Problems:   Severe recurrent major depression without psychotic features (HCC)   Current Medications:  Current Facility-Administered Medications  Medication Dose Route Frequency Provider Last Rate Last Dose  . acetaminophen (TYLENOL) tablet 650 mg  650 mg Oral Q6H PRN Nira ConnBerry, Jason A, NP   650 mg at 09/01/18 1520  . alum & mag hydroxide-simeth (MAALOX/MYLANTA) 200-200-20 MG/5ML suspension 30 mL  30 mL Oral Q4H PRN Nira ConnBerry, Jason A, NP      . carbamazepine (TEGRETOL) chewable tablet 300 mg  300 mg Oral TID Aldean BakerSykes, Janet E, NP   300 mg at 09/01/18 1701  . gabapentin (NEURONTIN) capsule 300 mg  300 mg Oral TID Malvin JohnsFarah, Brian, MD   300 mg at 09/01/18 1701  . hydrOXYzine (ATARAX/VISTARIL) tablet 25 mg  25 mg Oral Q6H PRN Cobos, Rockey SituFernando A, MD   25 mg at 09/01/18 1702  . magnesium hydroxide (MILK OF MAGNESIA) suspension 30 mL  30 mL Oral Daily PRN Nira ConnBerry, Jason A, NP      . traZODone (DESYREL) tablet 100 mg  100 mg Oral QHS,MR X 1 Donell SievertSimon, Spencer E, PA-C   100 mg at 09/01/18 2225  . valACYclovir (VALTREX) tablet 1,000 mg  1,000 mg Oral Daily Nira ConnBerry, Jason A, NP   1,000 mg at 09/01/18 1331   PTA Medications: Medications Prior to Admission  Medication Sig Dispense Refill Last Dose  . albuterol (VENTOLIN HFA) 108 (90 Base) MCG/ACT inhaler Inhale 2 puffs into the lungs every 6 (six) hours as needed for wheezing or shortness of breath.      . ALPRAZolam (XANAX PO) Take 1-3 tablets by mouth once.     Marland Kitchen. BREO ELLIPTA 100-25 MCG/INH AEPB Inhale 1 puff into the lungs daily.      . citalopram (CELEXA) 40 MG tablet Take 40 mg by mouth daily.     . cloNIDine (CATAPRES) 0.2 MG tablet Take 0.2 mg by mouth 3 (three) times daily.      . folic acid (FOLVITE) 1 MG tablet Take 1 tablet (1 mg total) by mouth daily.  (May buy from over the counter): For folate replacement (Patient not taking: Reported on 08/27/2018)     . hydrOXYzine (ATARAX/VISTARIL) 25 MG tablet Take 1 tablet (25 mg total) by mouth every 6 (six) hours as needed for anxiety. (Patient not taking: Reported on 08/27/2018) 60 tablet 0   . hydrOXYzine (ATARAX/VISTARIL) 50 MG tablet Take 50 mg by mouth 3 (three) times daily.      Marland Kitchen. KURVELO 0.15-30 MG-MCG tablet Take 1 tablet by mouth daily.      Marland Kitchen. lidocaine (XYLOCAINE) 2 % jelly      . LORazepam (ATIVAN PO) Take 1-3 tablets by mouth once.     . metoprolol tartrate (LOPRESSOR) 25 MG tablet Take 0.5 tablets (12.5 mg total) by mouth 2 (two) times daily. For high blood pressure (Patient not taking: Reported on 04/25/2018) 60 tablet 0   . QUEtiapine (SEROQUEL) 300 MG tablet Take 1 tablet (300 mg total) by mouth 2 (two) times daily. For mood control (Patient not taking: Reported on 08/27/2018) 60 tablet 0   . sertraline (ZOLOFT) 50 MG tablet Take 1 tablet (50 mg total) by mouth daily. For depression (Patient not taking: Reported on 08/27/2018) 30 tablet 0   . traZODone (DESYREL) 50 MG tablet Take 1 tablet (50 mg  total) by mouth at bedtime as needed for sleep. (Patient not taking: Reported on 04/25/2018) 30 tablet 0   . valACYclovir (VALTREX) 1000 MG tablet Take 1 tablet (1,000 mg total) by mouth daily. For herpes infection     . [DISCONTINUED] gabapentin (NEURONTIN) 400 MG capsule Take 1 capsule (400 mg total) by mouth 2 (two) times daily. For agitation (Patient not taking: Reported on 08/27/2018) 60 capsule 0     Patient Stressors:    Patient Strengths:    Treatment Modalities: Medication Management, Group therapy, Case management,  1 to 1 session with clinician, Psychoeducation, Recreational therapy.   Physician Treatment Plan for Primary Diagnosis: <principal problem not specified> Long Term Goal(s): Improvement in symptoms so as ready for discharge Improvement in symptoms so as ready for discharge    Short Term Goals: Ability to verbalize feelings will improve Ability to disclose and discuss suicidal ideas Ability to demonstrate self-control will improve Ability to identify and develop effective coping behaviors will improve Ability to identify and develop effective coping behaviors will improve Ability to maintain clinical measurements within normal limits will improve Compliance with prescribed medications will improve  Medication Management: Evaluate patient's response, side effects, and tolerance of medication regimen.  Therapeutic Interventions: 1 to 1 sessions, Unit Group sessions and Medication administration.  Evaluation of Outcomes: Adequate for Discharge  Physician Treatment Plan for Secondary Diagnosis: Active Problems:   Severe recurrent major depression without psychotic features (HCC)  Long Term Goal(s): Improvement in symptoms so as ready for discharge Improvement in symptoms so as ready for discharge   Short Term Goals: Ability to verbalize feelings will improve Ability to disclose and discuss suicidal ideas Ability to demonstrate self-control will improve Ability to identify and develop effective coping behaviors will improve Ability to identify and develop effective coping behaviors will improve Ability to maintain clinical measurements within normal limits will improve Compliance with prescribed medications will improve     Medication Management: Evaluate patient's response, side effects, and tolerance of medication regimen.  Therapeutic Interventions: 1 to 1 sessions, Unit Group sessions and Medication administration.  Evaluation of Outcomes: Adequate for Discharge   RN Treatment Plan for Primary Diagnosis: <principal problem not specified> Long Term Goal(s): Knowledge of disease and therapeutic regimen to maintain health will improve  Short Term Goals: Ability to participate in decision making will improve, Ability to verbalize feelings will improve,  Ability to disclose and discuss suicidal ideas, Ability to identify and develop effective coping behaviors will improve and Compliance with prescribed medications will improve  Medication Management: RN will administer medications as ordered by provider, will assess and evaluate patient's response and provide education to patient for prescribed medication. RN will report any adverse and/or side effects to prescribing provider.  Therapeutic Interventions: 1 on 1 counseling sessions, Psychoeducation, Medication administration, Evaluate responses to treatment, Monitor vital signs and CBGs as ordered, Perform/monitor CIWA, COWS, AIMS and Fall Risk screenings as ordered, Perform wound care treatments as ordered.  Evaluation of Outcomes: Adequate for Discharge   LCSW Treatment Plan for Primary Diagnosis: <principal problem not specified> Long Term Goal(s): Safe transition to appropriate next level of care at discharge, Engage patient in therapeutic group addressing interpersonal concerns.  Short Term Goals: Engage patient in aftercare planning with referrals and resources and Increase skills for wellness and recovery  Therapeutic Interventions: Assess for all discharge needs, 1 to 1 time with Social worker, Explore available resources and support systems, Assess for adequacy in community support network, Educate family and significant other(s)  on suicide prevention, Complete Psychosocial Assessment, Interpersonal group therapy.  Evaluation of Outcomes: Adequate for Discharge   Progress in Treatment: Attending groups: No. Participating in groups: No. Taking medication as prescribed: No. Toleration medication: No. Family/Significant other contact made: Yes, individual(s) contacted:  pt denied ; with pt Patient understands diagnosis: Yes. Discussing patient identified problems/goals with staff: Yes. Medical problems stabilized or resolved: Yes. Denies suicidal/homicidal ideation:  Yes. Issues/concerns per patient self-inventory: No. Other:   New problem(s) identified: No, Describe:  None  New Short Term/Long Term Goal(s): Medication stabilization, elimination of SI thoughts, and development of a comprehensive mental wellness plan.   Patient Goals:    Discharge Plan or Barriers: Pt is discharging today  Reason for Continuation of Hospitalization: Pt is discharging today  Estimated Length of Stay: Pt is discharging today  Attendees: Patient: 09/02/2018  Physician: Dr. Johnn Hai, MD  09/02/2018  Nursing: Benjamine Mola, RN  09/02/2018  RN Care Manager: 09/02/2018   Social Worker: Ardelle Anton, [place] 09/02/2018   Recreational Therapist:  09/02/2018  Other:  09/02/2018   Other:  09/02/2018   Other: 09/02/2018      Scribe for Treatment Team: Trecia Rogers, LCSW 09/02/2018 9:24 AM

## 2018-09-02 NOTE — Progress Notes (Signed)
Recreation Therapy Notes  Date: 6.29.20 Time: 1000 Location: 500 Hall Dayroom  Group Topic: Anxiety  Goal Area(s) Addresses:  Patient will identify triggers for anxiety. Patient will identify physical symptoms when anxious. Patient will identify coping strategies for anxiety.  Intervention: Worksheet  Activity:  Introduction to Anxiety.  Patients will identify things that trigger anxiety, physical symptoms to anxiety, thoughts that occur when anxious and ways to cope with anxiety.  Education: Communication, Discharge Planning  Education Outcome: Acknowledges understanding/In group clarification offered/Needs additional education.   Clinical Observations/Feedback: Pt did not attend group.    Victorino Sparrow, LRT/CTRS         Ria Comment, Salam Micucci A 09/02/2018 11:15 AM

## 2018-09-02 NOTE — [facility] Suicide Risk Assessment (Signed)
[facility] Discharge Suicide Risk Assessment   Principal Problem: Substance abuse/alprazolam dependency/risk of seizures Discharge Diagnoses: Active Problems:   Severe recurrent major depression without psychotic features ([place])   Total Time spent with patient: 45 minutes  Musculoskeletal: Strength & Muscle Tone: within normal limits Gait & Station: normal Patient leans: N/A  Psychiatric Specialty Exam: ROS  Blood pressure (!) 152/81, pulse (!) 115, temperature 98 F (36.7 C), resp. rate 18, last menstrual period 08/27/2018.There is no height or weight on file to calculate BMI.  General Appearance: Casual  Eye Contact::  Poor  Speech:  Normal Rate409  Volume:  Normal  Mood:  Irritable  Affect:  Full Range  Thought Process:  Coherent and Descriptions of Associations: Tangential  Orientation:  Full (Time, Place, and Person)  Thought Content:  Tangential  Suicidal Thoughts:  denies  Homicidal Thoughts:  No  Memory:  Immediate;   Fair  Judgement:  Fair  Insight:  Shallow  Psychomotor Activity:  Normal  Concentration:  Fair  Recall:  [employer] of Knowledge:Fair  Language: Fair  Akathisia:  Negative  Handed:  Right  AIMS (if indicated):     Assets:  Communication Skills Physical Health Resilience  Sleep:  Number of Hours: 6.75  Cognition: WNL  ADL's:  Intact   Mental Status Per Nursing Assessment::   On Admission:     Demographic Factors:  Unemployed  Loss Factors: Decrease in vocational status  Historical Factors: Impulsivity  Risk Reduction Factors:   Religious beliefs about death  Continued Clinical Symptoms:  Alcohol/Substance Abuse/Dependencies  Cognitive Features That Contribute To Risk:  Polarized thinking    Suicide Risk:  Minimal: No identifiable suicidal ideation.  Patients presenting with no risk factors but with morbid ruminations; may be classified as minimal risk based on the severity of the depressive symptoms  Patient is at high risk for a  non-intentional overdose  Plan Of Care/Follow-up recommendations:  Activity:  full  Chandria Rookstool, MD 09/02/2018, 8:16 AM

## 2018-09-02 NOTE — Progress Notes (Signed)
Pt discharged to lobby. Pt was stable and appreciative at that time. All papers and prescriptions were given and valuables returned. Verbal understanding expressed. Denies SI/HI and A/VH. Pt given opportunity to express concerns and ask questions.  

## 2018-09-02 NOTE — Progress Notes (Signed)
  [facility] Adult Case Management Discharge Plan :  Will you be returning to the same living situation after discharge:  Yes,  home with father At discharge, do you have transportation home?: Yes,  pt's father Do you have the ability to pay for your medications: Yes,  private insurance  Release of information consent forms completed and in the chart;  Patient's signature needed at discharge.  Patient to Follow up at: [facility] Follow up on 09/12/2018.   Why: Therapy and medication management appointmet is Thursday, 7/9 at 8:00a.  Please call within 24 hours of discharge to confirm appointment and pay $20 deposit.  Contact information: [place] [phone]           Next level of care provider has access to [place] and Suicide Prevention discussed: Yes,  pt denied; with pt     Has patient been referred to the Quitline?: Patient refused referral  Patient has been referred for addiction treatment: Pt. refused referral ; Pt reports that she will find her own addiction treatment.   Trecia Rogers, LCSW 09/02/2018, 10:07 AM

## 2018-11-19 ENCOUNTER — Telehealth: Payer: Self-pay | Admitting: Pulmonary Disease

## 2018-11-19 DIAGNOSIS — Z930 Tracheostomy status: Secondary | ICD-10-CM

## 2018-11-19 DIAGNOSIS — J9600 Acute respiratory failure, unspecified whether with hypoxia or hypercapnia: Secondary | ICD-10-CM

## 2018-11-19 DIAGNOSIS — Z0000 Encounter for general adult medical examination without abnormal findings: Secondary | ICD-10-CM

## 2018-11-19 NOTE — Telephone Encounter (Signed)
Called back patient mother and advised referral will be placed. I asked for her daughter's new address so it could be added to the referral placed.   It is : [place], [place] [id number]  Referral placed. Nothing further needed at this time.

## 2018-11-19 NOTE — Telephone Encounter (Signed)
If patient is now currently living in [place].  Okay to place referrals [id number] carePlease place orders.Wyn Quaker, FNP

## 2018-11-19 NOTE — Telephone Encounter (Signed)
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  Pt states "I think my throat needs to be stretched".    Mother has been looking into getting pt into either [facility] or [facility], but was advised that pt will need a referral to get an appt.  Pt has not established with a PCP or any other provider since moving to [place].  Aaron Edelman please advise if you are ok with referring pt to ENT.  Thanks!

## 2019-03-05 ENCOUNTER — Telehealth (INDEPENDENT_AMBULATORY_CARE_PROVIDER_SITE_OTHER): Payer: PRIVATE HEALTH INSURANCE | Admitting: Family Medicine

## 2019-03-05 ENCOUNTER — Encounter: Payer: Self-pay | Admitting: Family Medicine

## 2019-03-05 ENCOUNTER — Other Ambulatory Visit: Payer: Self-pay

## 2019-03-05 DIAGNOSIS — Z30011 Encounter for initial prescription of contraceptive pills: Secondary | ICD-10-CM

## 2019-03-05 MED ORDER — NORETHINDRONE-ETH ESTRADIOL 1-35 MG-MCG PO TABS: 1.0000 | ORAL_TABLET | Freq: Every day | ORAL | 4 refills | Status: AC

## 2019-03-05 NOTE — Progress Notes (Signed)
TELEHEALTH VIRTUAL GYNECOLOGY VISIT ENCOUNTER NOTE  I connected with Lindsay Lara on 03/05/19 at  2:30 PM EST by telephone at home and verified that I am speaking with the correct person using two identifiers.   I discussed the limitations, risks, security and privacy concerns of performing an evaluation and management service by telephone and the availability of in person appointments. I also discussed with the patient that there may be a patient responsible charge related to this service. The patient expressed understanding and agreed to proceed.   History:  Lindsay Lara is a 18 y.o. G0P0000 female being evaluated today for contraception-- she was seen at a [facility] and put on Gen-Cycla and is currently out of medication. She has missed two pills. She reports liking the OCP and does not have problems forgetting to take OCP. She denies SE from OCP. She reports no sex in the last 72 hours. LMP was normal. She denies any abnormal vaginal discharge, bleeding, pelvic pain or other concerns.  She denies history of clotting, bleeding disorder in herself or family. Reviewed her medications in detail and added new medications to list. No history of migraine.      Past Medical History:  Diagnosis Date  . Acne   . ADHD (attention deficit hyperactivity disorder) 05/08/2012  . Anxiety   . Bipolar and related disorder ([place]) 09/18/2015  . Depression   . 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 Pneumonia at age 42  . Polysubstance abuse ([place]) 09/18/2015  . Scoliosis no treatment required  . Substance induced mood disorder ([place]) 09/18/2015   History reviewed. No pertinent surgical history.   The following portions of the patient's history were reviewed and updated as appropriate: allergies, current medications, past family history, past medical history, past social history, past surgical history and problem list.   Health Maintenance:  Needs pap at 19  Review of Systems:  Pertinent items  noted in HPI and remainder of comprehensive ROS otherwise negative.  Physical Exam:   General:  Alert, oriented and cooperative.   Mental Status: Normal mood and affect perceived. Normal judgment and thought content.  Physical exam deferred due to nature of the encounter  Labs and Imaging No results found for this or any previous visit (from the past 336 hour(s)). No results found.    Assessment and Plan:     1. Encounter for initial prescription of contraceptive pills Patient with complex medical history and multiple high risk medications. New to our practice but in need of contraception.   -Reviewed contraceptive options in tiered based fashion and she desires OCP to be continued -Discussed interaction of OCP and carbamazepine -- there is possibility of this medication decreasing the effectiveness of OCP. She voiced understanding and decided to continue. Reviewed that we can continue to discuss other options that avoid this hepatic interaction (Nexplanon and IUD being the best options).  -Discussed with patient need for annual wellness visit to assure she recieves pap smear testing at age 74 and routine STI screening base on age. She voiced understanding and will discuss with parents.  - Recommended condom use for 2 weeks to back up OCP since she has missed two doses.  -Reviewed the medication I am sending is will be similar to Gen-Cycla-- they are the same active ingredients and hormones.  - norethindrone-ethinyl estradiol 1/35 (ORTHO-NOVUM) tablet; Take 1 tablet by mouth daily.  Dispense: 3 Package; Refill: 4      I discussed the assessment  and treatment plan with the patient. The patient was provided an opportunity to ask questions and all were answered. The patient agreed with the plan and demonstrated an understanding of the instructions.   The patient was advised to call back or seek an in-person evaluation/go to the ED if the symptoms worsen or if the condition fails to improve as  anticipated.  I provided 30 minutes of non-face-to-face time during this encounter.   Federico Flake, MD [facility] [employer], [facility]

## 2020-01-13 IMAGING — DX DG CHEST 1V PORT
1 series · 1 of 1 positions shown · non-contrast
Comparison: Yesterday

CLINICAL DATA: Endotracheal intubation

EXAM:
PORTABLE CHEST 1 VIEW

[chest]
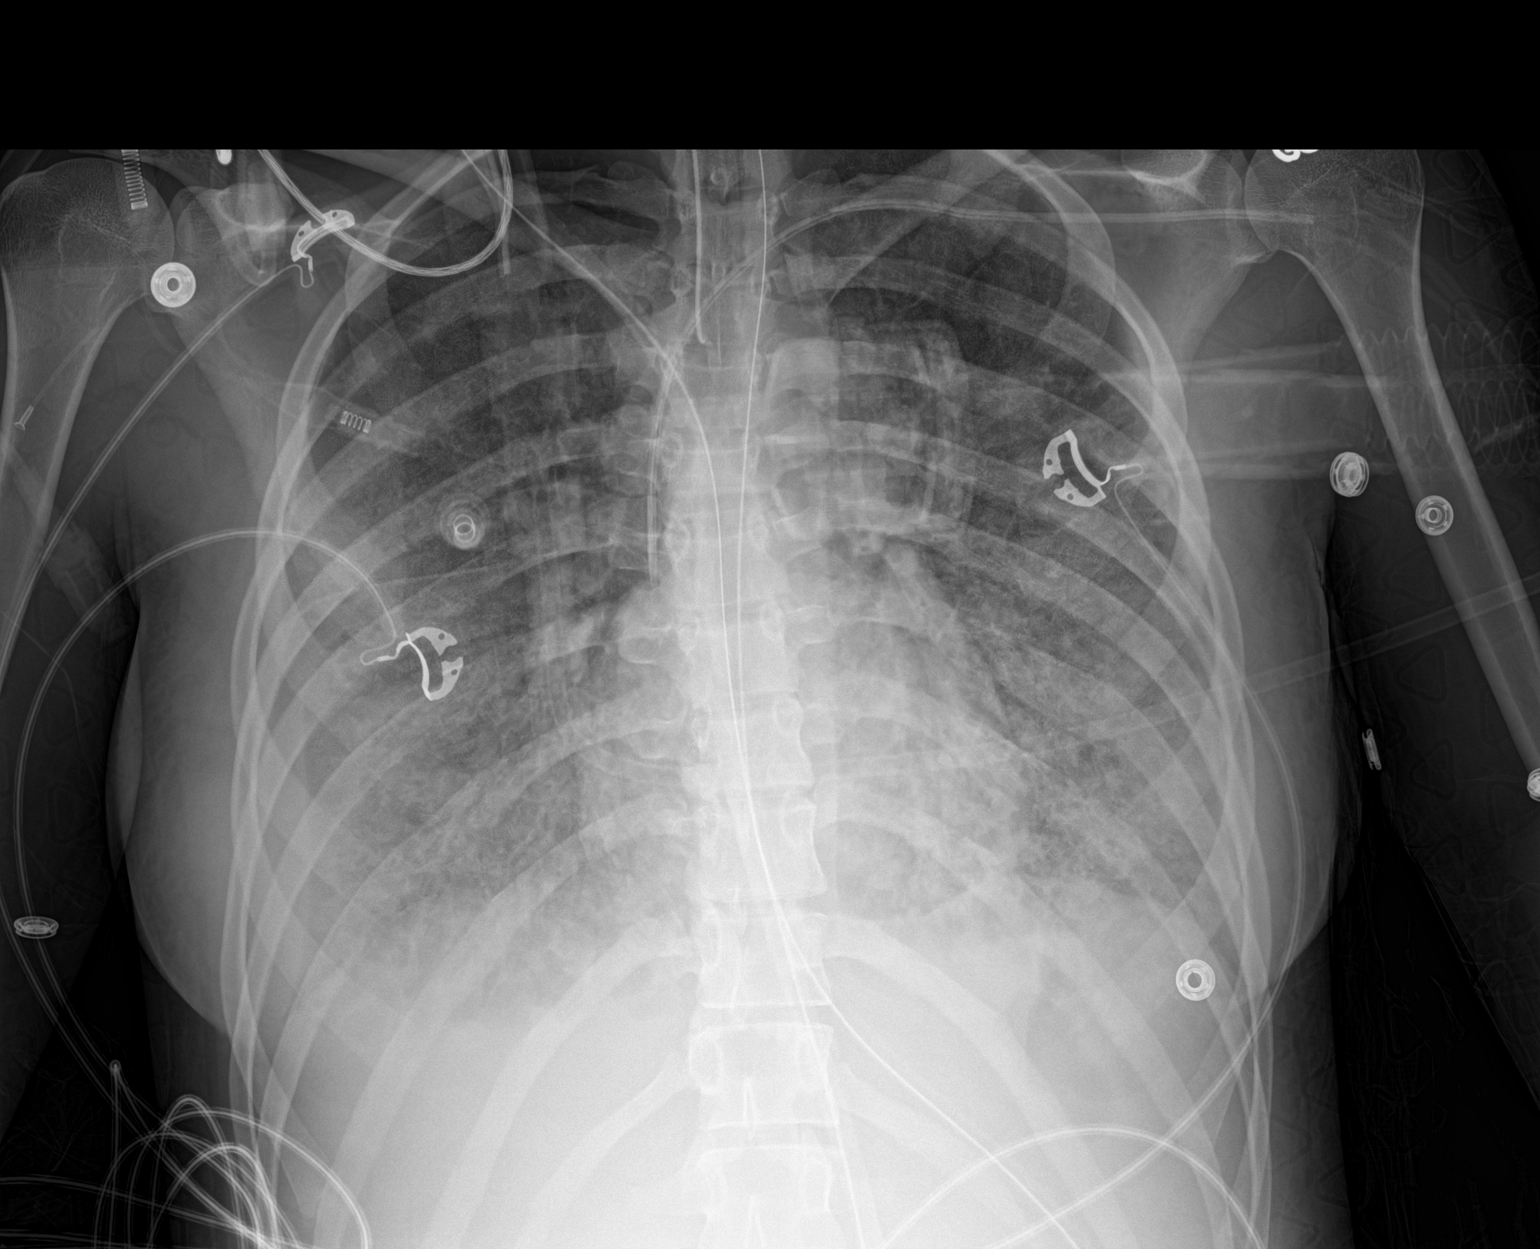

[1 of 1 positions shown; findings below may reference images not displayed]

FINDINGS: Endotracheal tube tip at the clavicular heads. The orogastric tube
reaches the stomach. Left subclavian line with tip at the upper
cavoatrial junction. Dense bilateral airspace disease with pleural
fluid on the right at least. No evident pneumothorax. Normal heart
size.
IMPRESSION: 1. Stable hardware in good position.
2. Unchanged extensive airspace disease with right pleural effusion.

## 2020-01-14 IMAGING — DX DG CHEST 1V PORT
1 series · 1 of 1 positions shown · non-contrast
Comparison: Yesterday

CLINICAL DATA: Endotracheal intubation

EXAM:
PORTABLE CHEST 1 VIEW

[chest ap]
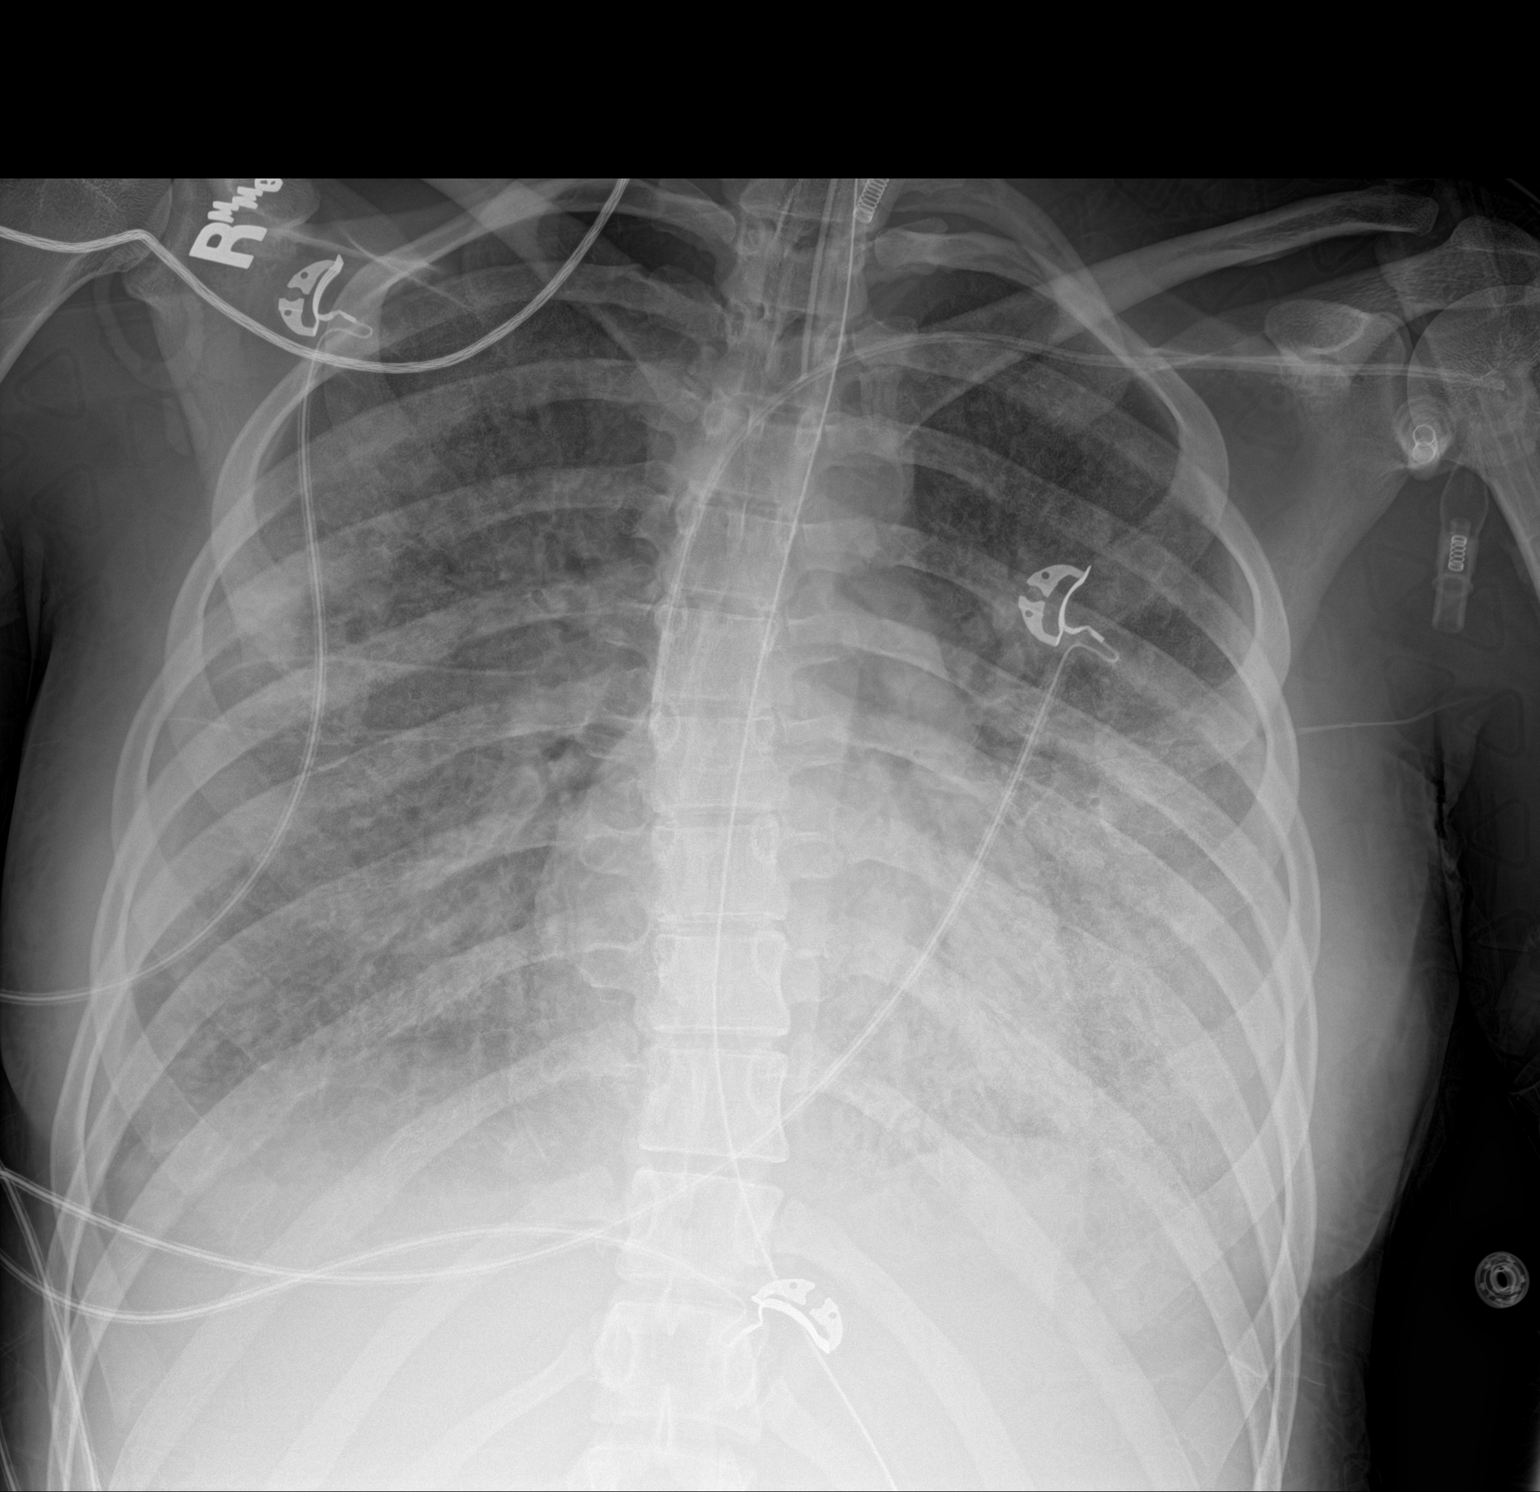

[1 of 1 positions shown; findings below may reference images not displayed]

FINDINGS: Endotracheal tube tip at the clavicular heads. Left subclavian line
with tip at the upper cavoatrial junction. The orogastric tube
reaches the stomach.

Unchanged diffuse hazy lung opacity. No pneumothorax. Evidence of
layering pleural effusions.
IMPRESSION: 1. Stable and unremarkable hardware positioning.
2. Unchanged extensive airspace disease with layering pleural
effusions.

## 2020-01-21 IMAGING — DX DG CHEST 1V PORT
1 series · 2 of 2 positions shown · non-contrast
Comparison: None.

CLINICAL DATA: Orogastric tube

EXAM:
PORTABLE CHEST 1 VIEW

[Series 1: chest · 0.14mm/px · 2 of 2 slices shown]
[im 1/2]
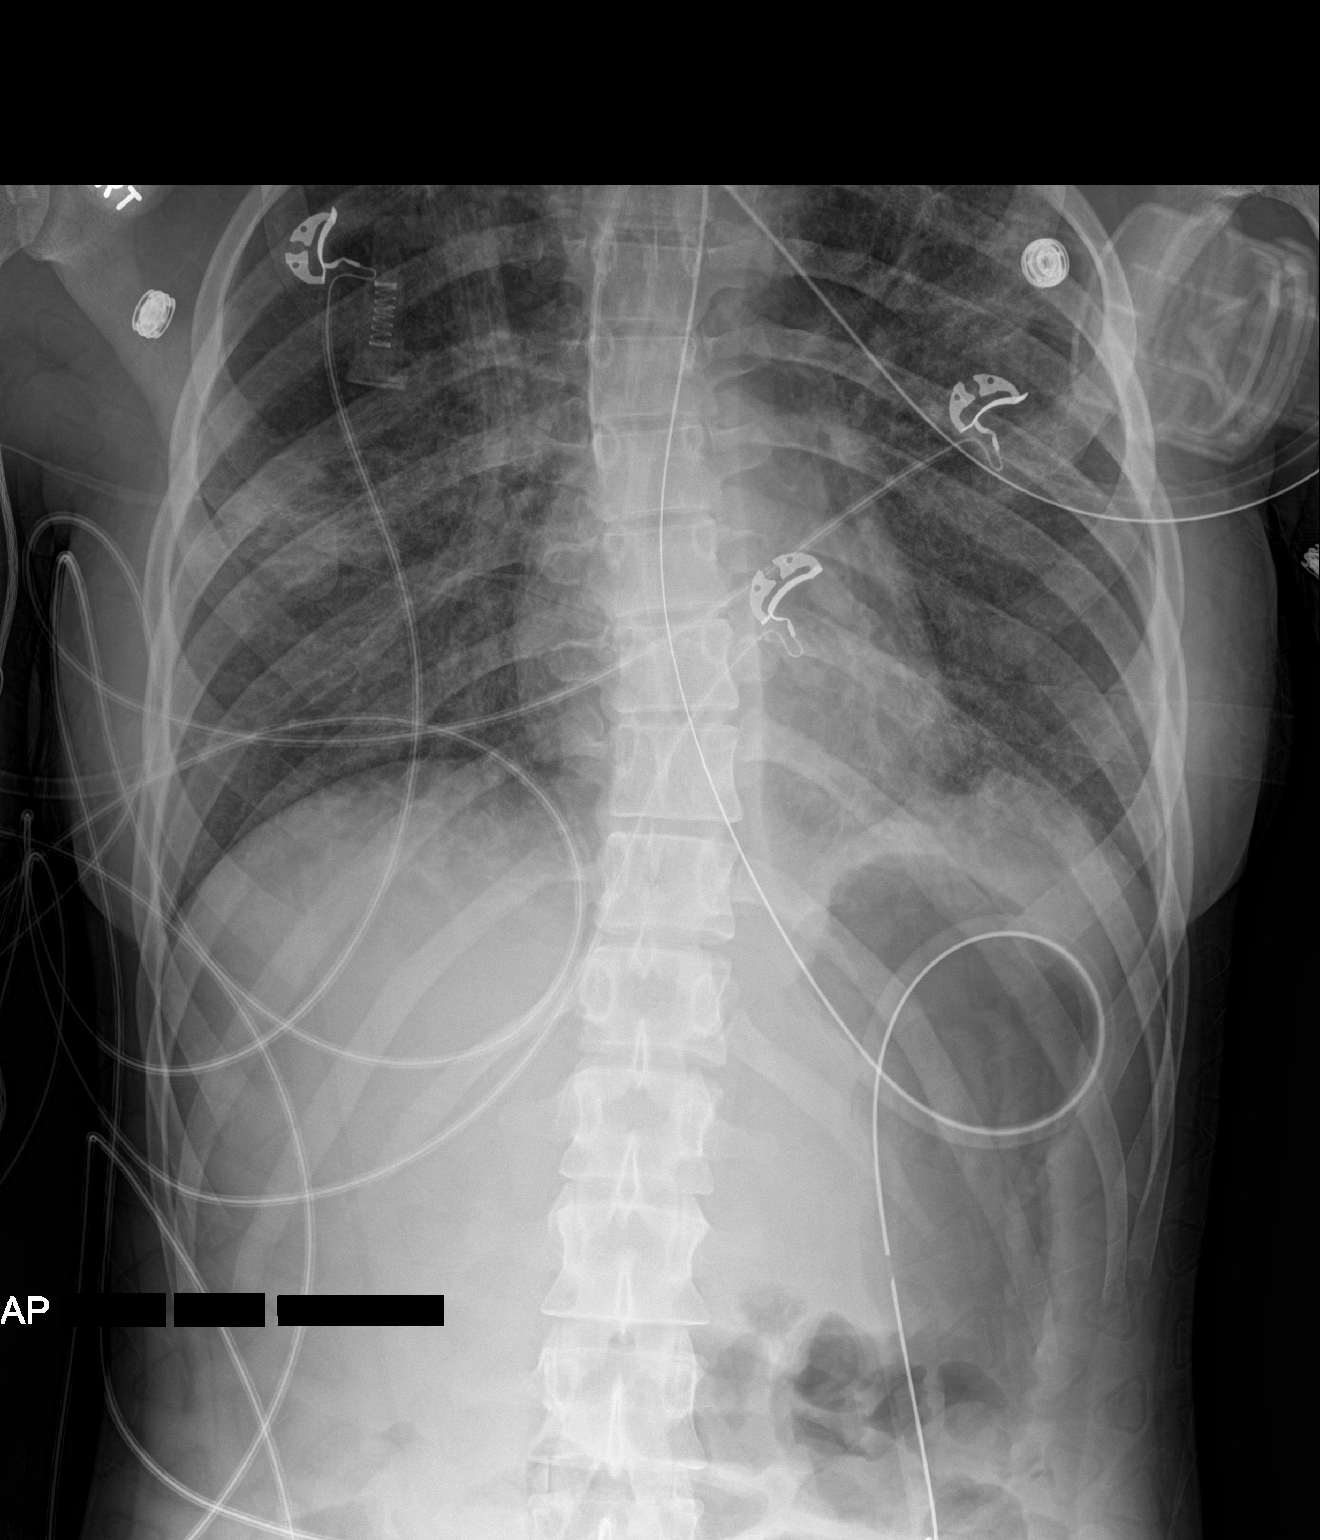
[im 2/2]
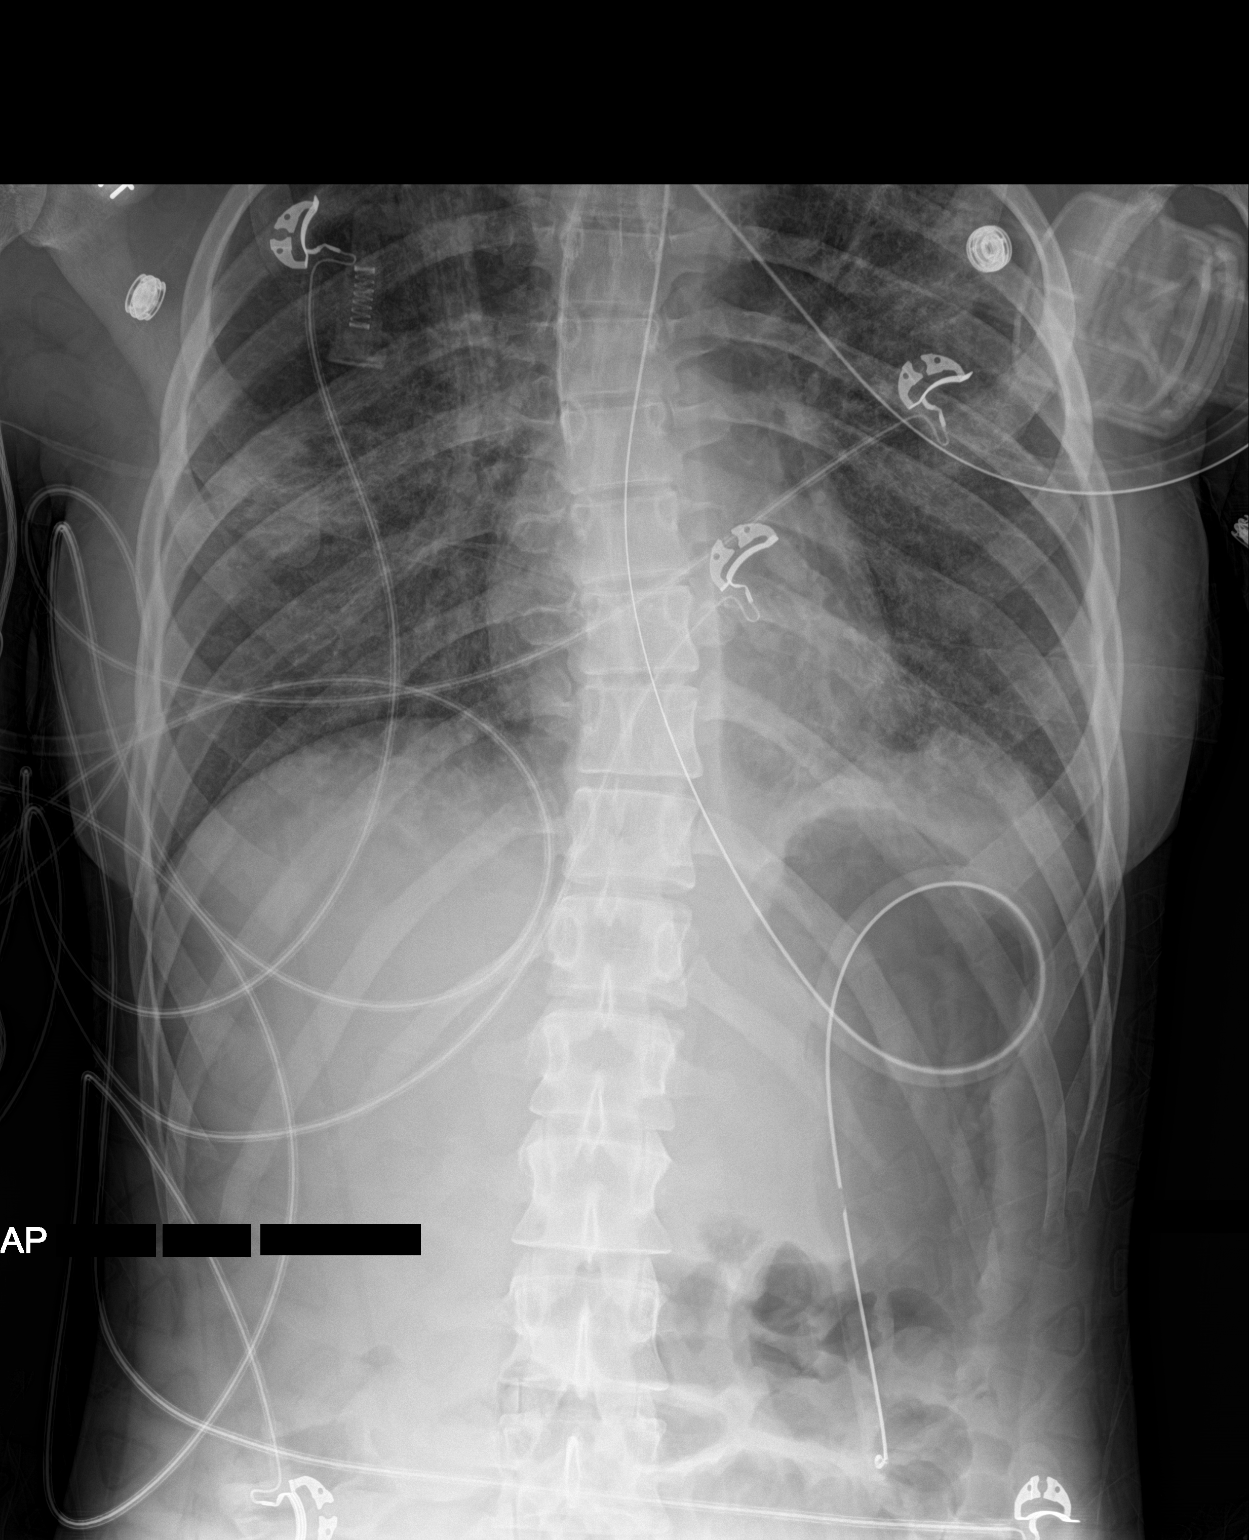

[2 of 2 positions shown; findings below may reference images not displayed]

FINDINGS: No endotracheal tube is visualized. Orogastric tube tip and side
port project over the J-shaped stomach. Left subclavian approach
central venous catheter tip is at the cavoatrial junction. Improved
pulmonary edema.
IMPRESSION: 1. No endotracheal tube visualized.
2. Orogastric tube tip and side port project over the stomach.
3. Improved pulmonary edema.

## 2020-01-22 IMAGING — DX DG ABD PORTABLE 1V
1 series · 1 of 1 positions shown · non-contrast
Comparison: None.

CLINICAL DATA: 17-year-old female

EXAM:
PORTABLE ABDOMEN - 1 VIEW

[abdomen kub]
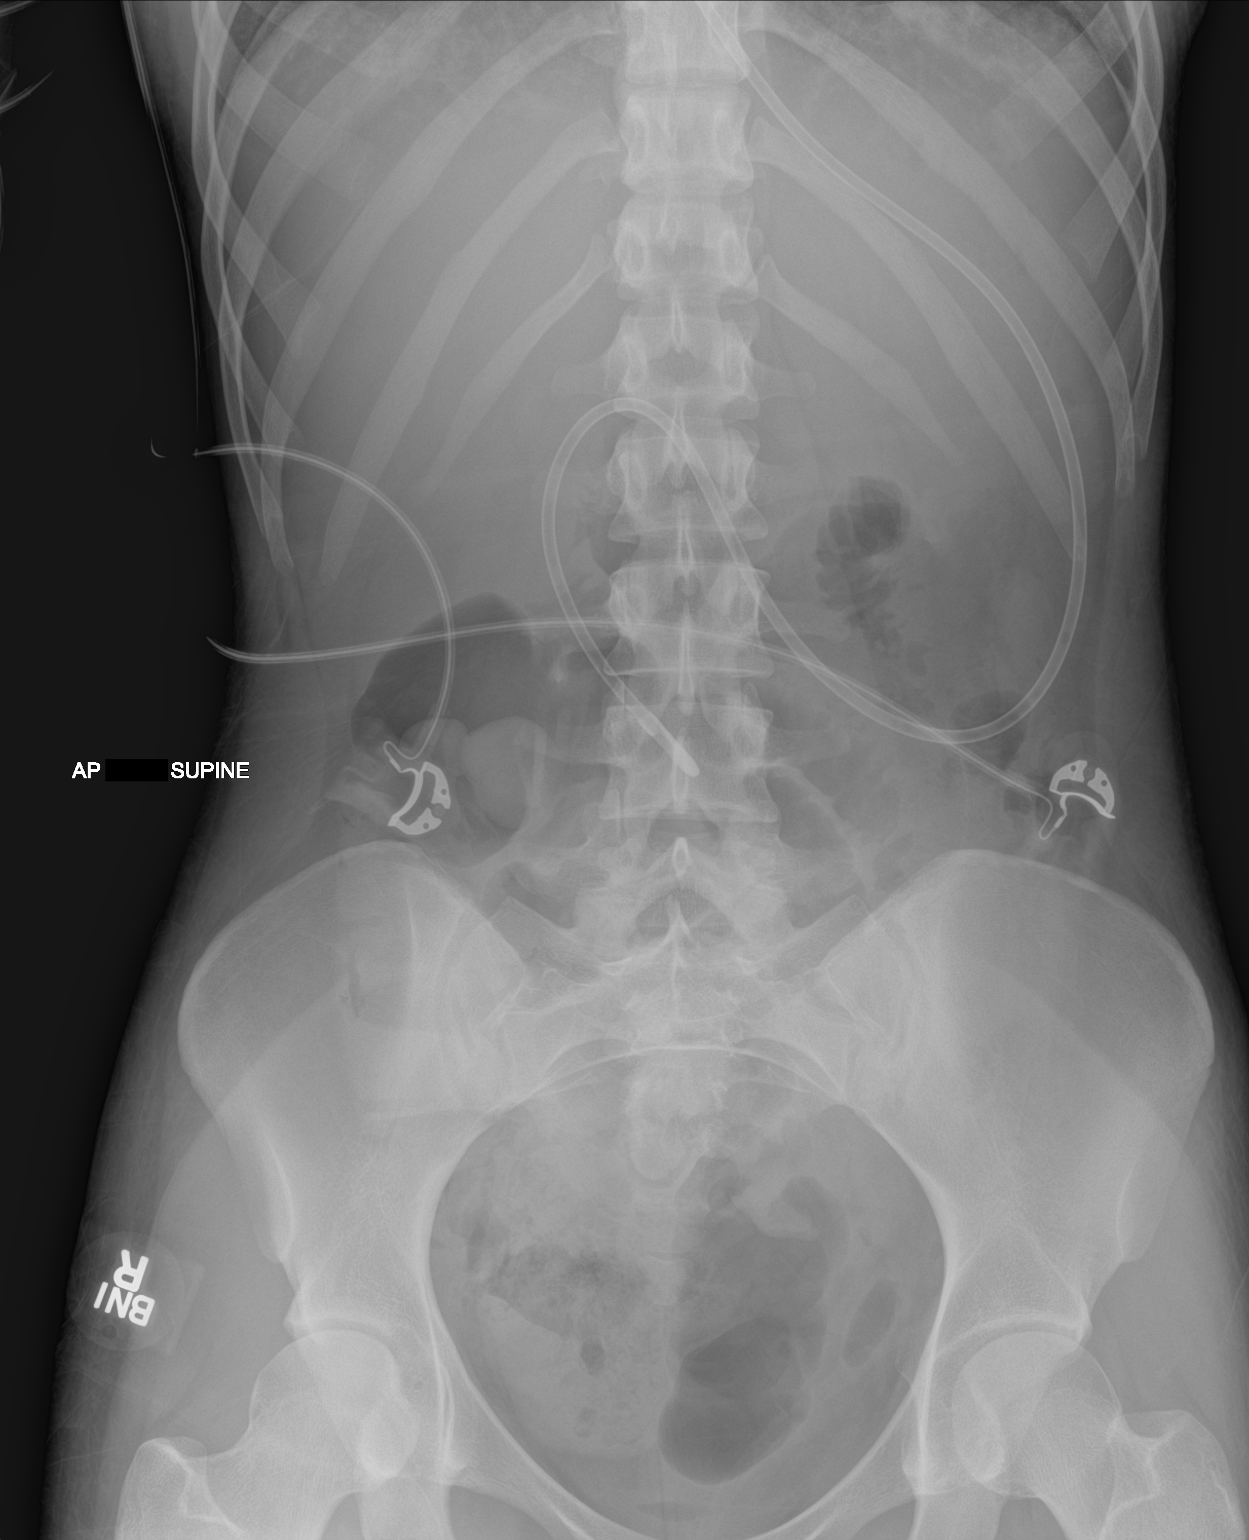

[1 of 1 positions shown; findings below may reference images not displayed]

FINDINGS: Metallic tip enteric feeding tube terminates in the region of the
[DATE] portion of duodenum.

Gas within stomach and small bowel.  No abnormal distention.

Unremarkable musculoskeletal structures.
IMPRESSION: Metallic tip enteric feeding tube appears to terminate in the [DATE]
portion of the duodenum.

## 2020-01-22 IMAGING — DX DG CHEST 1V PORT
1 series · 1 of 1 positions shown · non-contrast
Comparison: Portable exam 9971 hours compared to 1887 hours

CLINICAL DATA: Post tracheostomy

EXAM:
PORTABLE CHEST 1 VIEW

[chest ap]
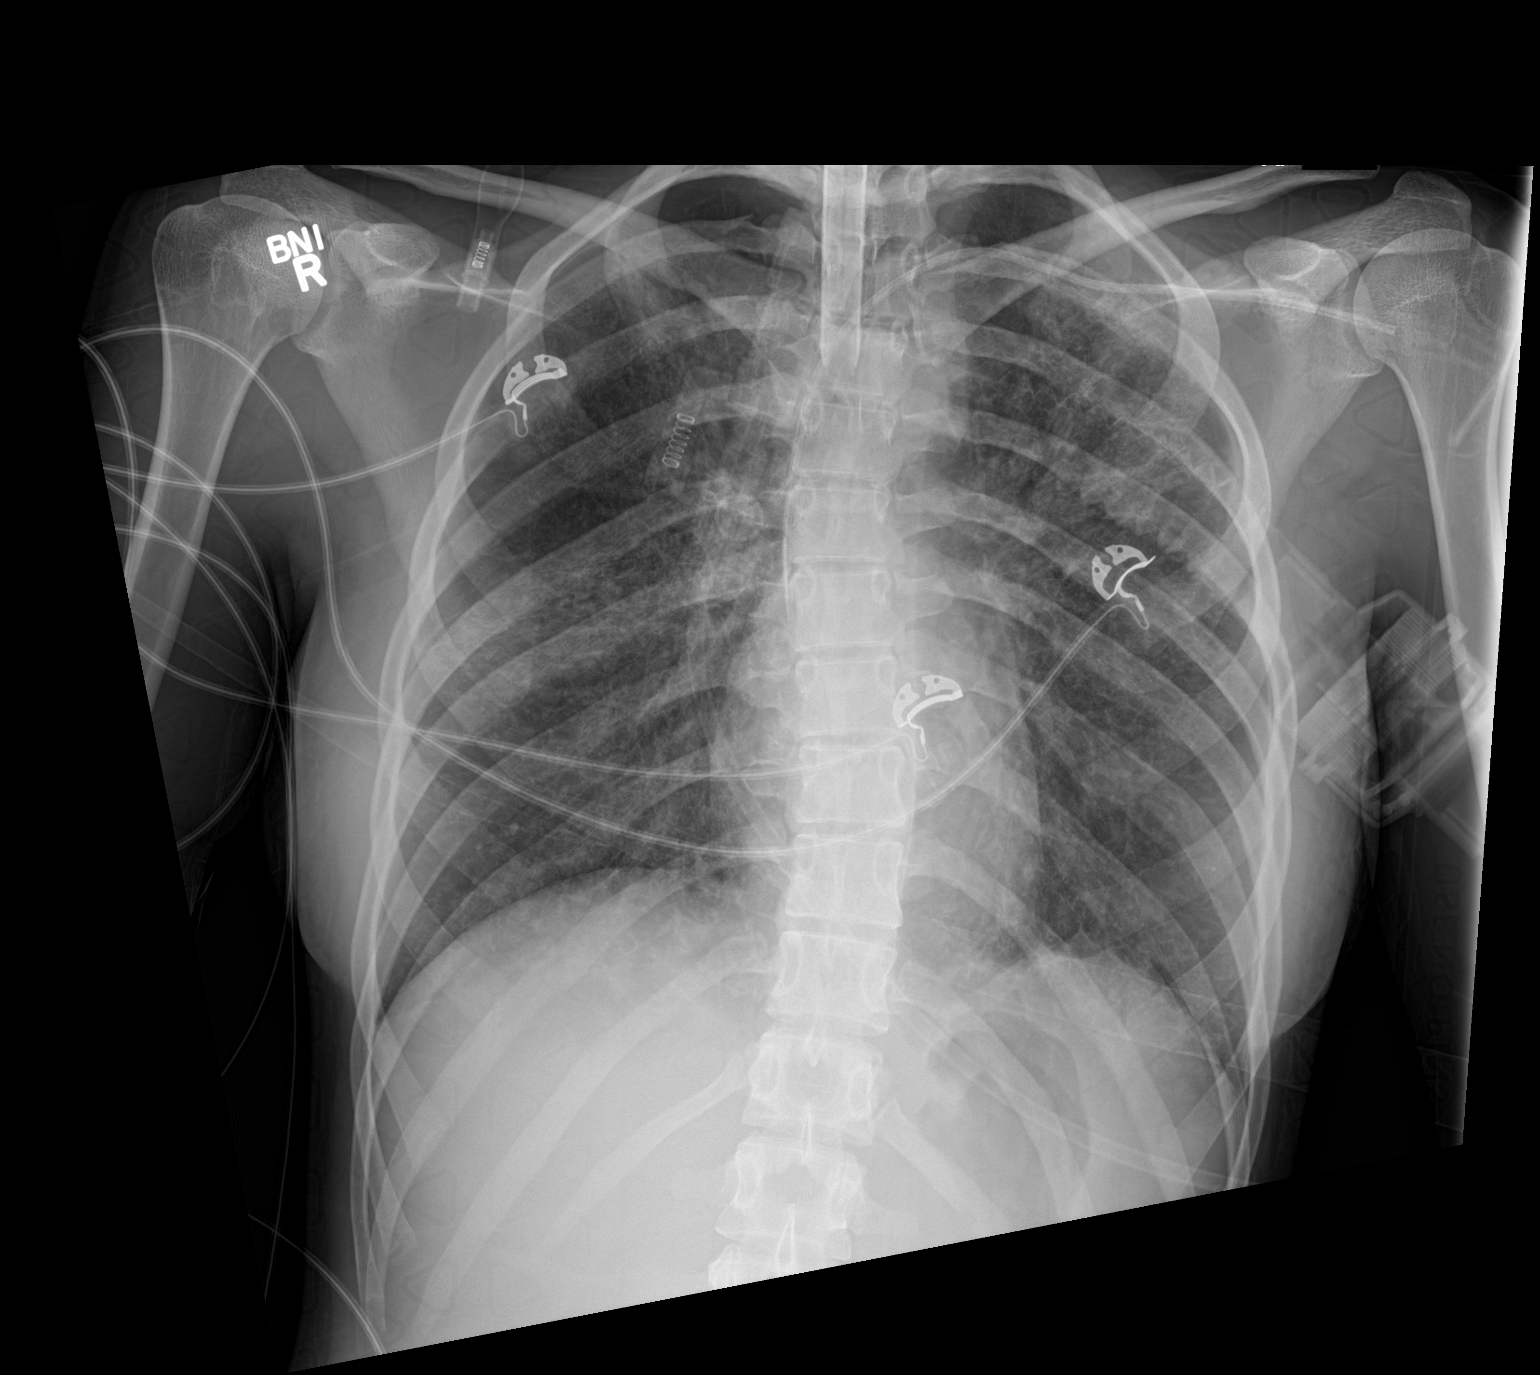

[1 of 1 positions shown; findings below may reference images not displayed]

FINDINGS: New tracheostomy tube projects over tracheal air column with tip
cm above carina.

LEFT subclavian central venous catheter with tip projecting over
SVC.

Normal heart size and mediastinal contours.

Minimal pulmonary infiltrates bilaterally, perhaps slightly improved
since earlier study.

No pleural effusion or pneumothorax.

Bones unremarkable.
IMPRESSION: Tracheostomy tube with tip projecting 2.1 cm above carina.

Persistent pulmonary infiltrates, probably slightly improved.
